# Patient Record
Sex: Female | Born: 1950 | Race: Black or African American | Hispanic: No | Marital: Single | Smoking: Current every day smoker
Health system: Southern US, Community
[De-identification: ages and names within clinical notes are randomized; demographics above are authoritative.]

## PROBLEM LIST (undated history)

## (undated) DIAGNOSIS — I050 Rheumatic mitral stenosis: Secondary | ICD-10-CM

## (undated) DIAGNOSIS — F419 Anxiety disorder, unspecified: Secondary | ICD-10-CM

## (undated) DIAGNOSIS — G4730 Sleep apnea, unspecified: Secondary | ICD-10-CM

## (undated) DIAGNOSIS — Z720 Tobacco use: Secondary | ICD-10-CM

## (undated) DIAGNOSIS — M1990 Unspecified osteoarthritis, unspecified site: Secondary | ICD-10-CM

## (undated) DIAGNOSIS — N184 Chronic kidney disease, stage 4 (severe): Secondary | ICD-10-CM

## (undated) DIAGNOSIS — E785 Hyperlipidemia, unspecified: Secondary | ICD-10-CM

## (undated) DIAGNOSIS — R55 Syncope and collapse: Secondary | ICD-10-CM

## (undated) DIAGNOSIS — M797 Fibromyalgia: Secondary | ICD-10-CM

## (undated) DIAGNOSIS — I119 Hypertensive heart disease without heart failure: Secondary | ICD-10-CM

## (undated) DIAGNOSIS — K219 Gastro-esophageal reflux disease without esophagitis: Secondary | ICD-10-CM

## (undated) DIAGNOSIS — I219 Acute myocardial infarction, unspecified: Secondary | ICD-10-CM

## (undated) DIAGNOSIS — D649 Anemia, unspecified: Secondary | ICD-10-CM

## (undated) DIAGNOSIS — E119 Type 2 diabetes mellitus without complications: Secondary | ICD-10-CM

## (undated) DIAGNOSIS — I2510 Atherosclerotic heart disease of native coronary artery without angina pectoris: Secondary | ICD-10-CM

## (undated) DIAGNOSIS — K5790 Diverticulosis of intestine, part unspecified, without perforation or abscess without bleeding: Secondary | ICD-10-CM

## (undated) DIAGNOSIS — I10 Essential (primary) hypertension: Secondary | ICD-10-CM

## (undated) DIAGNOSIS — IMO0002 Reserved for concepts with insufficient information to code with codable children: Secondary | ICD-10-CM

## (undated) DIAGNOSIS — K449 Diaphragmatic hernia without obstruction or gangrene: Secondary | ICD-10-CM

## (undated) DIAGNOSIS — R0600 Dyspnea, unspecified: Secondary | ICD-10-CM

## (undated) DIAGNOSIS — I499 Cardiac arrhythmia, unspecified: Secondary | ICD-10-CM

## (undated) HISTORY — PX: NECK SURGERY: SHX720

## (undated) HISTORY — DX: Diaphragmatic hernia without obstruction or gangrene: K44.9

## (undated) HISTORY — DX: Gastro-esophageal reflux disease without esophagitis: K21.9

## (undated) HISTORY — DX: Type 2 diabetes mellitus without complications: E11.9

## (undated) HISTORY — DX: Rheumatic mitral stenosis: I05.0

## (undated) HISTORY — DX: Tobacco use: Z72.0

## (undated) HISTORY — DX: Essential (primary) hypertension: I10

## (undated) HISTORY — DX: Unspecified osteoarthritis, unspecified site: M19.90

## (undated) HISTORY — DX: Reserved for concepts with insufficient information to code with codable children: IMO0002

## (undated) HISTORY — PX: CORONARY ANGIOPLASTY: SHX604

## (undated) HISTORY — DX: Hyperlipidemia, unspecified: E78.5

## (undated) HISTORY — DX: Atherosclerotic heart disease of native coronary artery without angina pectoris: I25.10

## (undated) HISTORY — DX: Hypertensive heart disease without heart failure: I11.9

## (undated) HISTORY — DX: Syncope and collapse: R55

## (undated) HISTORY — PX: TUBAL LIGATION: SHX77

## (undated) HISTORY — DX: Chronic kidney disease, stage 4 (severe): N18.4

---

## 2003-11-29 HISTORY — PX: BREAST CYST ASPIRATION: SHX578

## 2005-02-16 ENCOUNTER — Ambulatory Visit: Payer: Self-pay

## 2005-06-28 HISTORY — PX: CARDIAC CATHETERIZATION: SHX172

## 2005-07-28 ENCOUNTER — Ambulatory Visit: Payer: Self-pay | Admitting: Internal Medicine

## 2006-06-02 ENCOUNTER — Emergency Department: Payer: Self-pay | Admitting: Emergency Medicine

## 2006-06-13 ENCOUNTER — Ambulatory Visit: Payer: Self-pay

## 2006-08-02 ENCOUNTER — Ambulatory Visit: Payer: Self-pay | Admitting: Gastroenterology

## 2006-11-30 DIAGNOSIS — K922 Gastrointestinal hemorrhage, unspecified: Secondary | ICD-10-CM | POA: Insufficient documentation

## 2007-06-27 ENCOUNTER — Ambulatory Visit: Payer: Self-pay

## 2007-12-18 ENCOUNTER — Ambulatory Visit: Payer: Self-pay

## 2007-12-27 ENCOUNTER — Emergency Department: Payer: Self-pay | Admitting: Emergency Medicine

## 2008-07-28 ENCOUNTER — Ambulatory Visit: Payer: Self-pay | Admitting: *Deleted

## 2009-09-22 ENCOUNTER — Ambulatory Visit: Payer: Self-pay | Admitting: Family Medicine

## 2010-02-19 ENCOUNTER — Emergency Department: Payer: Self-pay | Admitting: Emergency Medicine

## 2010-09-23 ENCOUNTER — Ambulatory Visit: Payer: Self-pay | Admitting: Family Medicine

## 2011-06-03 ENCOUNTER — Ambulatory Visit: Payer: Self-pay | Admitting: Neurosurgery

## 2011-06-24 ENCOUNTER — Ambulatory Visit: Payer: Self-pay | Admitting: Internal Medicine

## 2011-09-30 ENCOUNTER — Ambulatory Visit: Payer: Self-pay | Admitting: Family Medicine

## 2011-10-11 ENCOUNTER — Ambulatory Visit: Payer: Self-pay | Admitting: Family Medicine

## 2012-01-07 ENCOUNTER — Emergency Department: Payer: Self-pay | Admitting: Emergency Medicine

## 2013-01-30 ENCOUNTER — Emergency Department: Payer: Self-pay | Admitting: Emergency Medicine

## 2013-01-30 LAB — CBC WITH DIFFERENTIAL/PLATELET
Eosinophil #: 0.1 10*3/uL (ref 0.0–0.7)
HCT: 44.7 % (ref 35.0–47.0)
Lymphocyte #: 2.3 10*3/uL (ref 1.0–3.6)
Lymphocyte %: 26.4 %
Monocyte #: 0.8 x10 3/mm (ref 0.2–0.9)
Monocyte %: 8.9 %
Neutrophil #: 5.4 10*3/uL (ref 1.4–6.5)
Neutrophil %: 62.1 %
RDW: 15.8 % — ABNORMAL HIGH (ref 11.5–14.5)
WBC: 8.7 10*3/uL (ref 3.6–11.0)

## 2013-01-30 LAB — COMPREHENSIVE METABOLIC PANEL
Albumin: 3.9 g/dL (ref 3.4–5.0)
Alkaline Phosphatase: 117 U/L (ref 50–136)
Anion Gap: 5 — ABNORMAL LOW (ref 7–16)
BUN: 31 mg/dL — ABNORMAL HIGH (ref 7–18)
Bilirubin,Total: 0.2 mg/dL (ref 0.2–1.0)
Calcium, Total: 9.6 mg/dL (ref 8.5–10.1)
Co2: 26 mmol/L (ref 21–32)
Creatinine: 1.48 mg/dL — ABNORMAL HIGH (ref 0.60–1.30)
EGFR (African American): 44 — ABNORMAL LOW
EGFR (Non-African Amer.): 38 — ABNORMAL LOW
Osmolality: 285 (ref 275–301)
Potassium: 4.5 mmol/L (ref 3.5–5.1)
SGOT(AST): 18 U/L (ref 15–37)
Sodium: 138 mmol/L (ref 136–145)
Total Protein: 8.7 g/dL — ABNORMAL HIGH (ref 6.4–8.2)

## 2013-01-30 LAB — SEDIMENTATION RATE: Erythrocyte Sed Rate: 3 mm/hr (ref 0–30)

## 2013-01-30 LAB — TROPONIN I: Troponin-I: <0.02 ng/mL

## 2013-04-17 DIAGNOSIS — E559 Vitamin D deficiency, unspecified: Secondary | ICD-10-CM | POA: Insufficient documentation

## 2013-04-23 ENCOUNTER — Ambulatory Visit: Payer: Self-pay

## 2013-04-29 ENCOUNTER — Ambulatory Visit: Payer: Self-pay

## 2013-07-22 ENCOUNTER — Inpatient Hospital Stay: Payer: Self-pay | Admitting: Internal Medicine

## 2013-07-22 LAB — BASIC METABOLIC PANEL
Anion Gap: 8 (ref 7–16)
Anion Gap: 3 — ABNORMAL LOW (ref 7–16)
BUN: 55 mg/dL — ABNORMAL HIGH (ref 7–18)
BUN: 50 mg/dL — ABNORMAL HIGH (ref 7–18)
Calcium, Total: 9.2 mg/dL (ref 8.5–10.1)
Calcium, Total: 9 mg/dL (ref 8.5–10.1)
Chloride: 109 mmol/L — ABNORMAL HIGH (ref 98–107)
Chloride: 112 mmol/L — ABNORMAL HIGH (ref 98–107)
Creatinine: 3.63 mg/dL — ABNORMAL HIGH (ref 0.60–1.30)
Creatinine: 3.23 mg/dL — ABNORMAL HIGH (ref 0.60–1.30)
EGFR (African American): 17 — ABNORMAL LOW
EGFR (Non-African Amer.): 13 — ABNORMAL LOW
EGFR (Non-African Amer.): 15 — ABNORMAL LOW
Osmolality: 298 (ref 275–301)
Sodium: 137 mmol/L (ref 136–145)

## 2013-07-22 LAB — CBC WITH DIFFERENTIAL/PLATELET
Basophil %: 1.5 %
Eosinophil %: 2.6 %
HCT: 36.4 % (ref 35.0–47.0)
HGB: 12.1 g/dL (ref 12.0–16.0)
Lymphocyte #: 2 10*3/uL (ref 1.0–3.6)
Lymphocyte %: 35.1 %
MCH: 28.7 pg (ref 26.0–34.0)
MCHC: 33.4 g/dL (ref 32.0–36.0)
MCV: 86 fL (ref 80–100)
Monocyte %: 12.6 %
Neutrophil #: 2.7 10*3/uL (ref 1.4–6.5)
Neutrophil %: 48.2 %
RBC: 4.23 10*6/uL (ref 3.80–5.20)
RDW: 15.2 % — ABNORMAL HIGH (ref 11.5–14.5)
WBC: 5.6 10*3/uL (ref 3.6–11.0)

## 2013-07-22 LAB — HEMOGLOBIN A1C: Hemoglobin A1C: 9.1 % — ABNORMAL HIGH (ref 4.2–6.3)

## 2013-07-23 LAB — MAGNESIUM: Magnesium: 1.5 mg/dL — ABNORMAL LOW

## 2013-07-23 LAB — BASIC METABOLIC PANEL
BUN: 46 mg/dL — ABNORMAL HIGH (ref 7–18)
Calcium, Total: 8.9 mg/dL (ref 8.5–10.1)
Creatinine: 2.65 mg/dL — ABNORMAL HIGH (ref 0.60–1.30)
Glucose: 99 mg/dL (ref 65–99)
Osmolality: 289 (ref 275–301)

## 2013-07-23 LAB — CBC WITH DIFFERENTIAL/PLATELET
Basophil #: 0.1 10*3/uL (ref 0.0–0.1)
Basophil %: 1.1 %
Eosinophil #: 0.1 10*3/uL (ref 0.0–0.7)
Eosinophil %: 2.8 %
HCT: 35.6 % (ref 35.0–47.0)
HGB: 11.9 g/dL — ABNORMAL LOW (ref 12.0–16.0)
Lymphocyte %: 53.1 %
MCH: 28.4 pg (ref 26.0–34.0)
MCV: 85 fL (ref 80–100)
Monocyte #: 0.5 x10 3/mm (ref 0.2–0.9)
Monocyte %: 9.7 %
Platelet: 325 10*3/uL (ref 150–440)
RBC: 4.2 10*6/uL (ref 3.80–5.20)
WBC: 5.3 10*3/uL (ref 3.6–11.0)

## 2013-07-23 LAB — LIPID PANEL
Cholesterol: 291 mg/dL — ABNORMAL HIGH (ref 0–200)
HDL Cholesterol: 37 mg/dL — ABNORMAL LOW (ref 40–60)

## 2013-07-24 LAB — BASIC METABOLIC PANEL
Anion Gap: 7 (ref 7–16)
Calcium, Total: 8.8 mg/dL (ref 8.5–10.1)
Co2: 28 mmol/L (ref 21–32)
Creatinine: 2.1 mg/dL — ABNORMAL HIGH (ref 0.60–1.30)
Glucose: 93 mg/dL (ref 65–99)
Potassium: 4 mmol/L (ref 3.5–5.1)
Sodium: 139 mmol/L (ref 136–145)

## 2013-08-05 ENCOUNTER — Ambulatory Visit: Payer: Self-pay

## 2013-08-31 ENCOUNTER — Emergency Department: Payer: Self-pay | Admitting: Emergency Medicine

## 2014-04-25 ENCOUNTER — Inpatient Hospital Stay: Payer: Self-pay | Admitting: Internal Medicine

## 2014-04-25 DIAGNOSIS — N189 Chronic kidney disease, unspecified: Secondary | ICD-10-CM

## 2014-04-25 DIAGNOSIS — I214 Non-ST elevation (NSTEMI) myocardial infarction: Secondary | ICD-10-CM

## 2014-04-25 DIAGNOSIS — I10 Essential (primary) hypertension: Secondary | ICD-10-CM

## 2014-04-25 LAB — CBC WITH DIFFERENTIAL/PLATELET
BASOS ABS: 0.1 10*3/uL (ref 0.0–0.1)
Basophil %: 1.3 %
Eosinophil #: 0.1 10*3/uL (ref 0.0–0.7)
Eosinophil %: 1.8 %
HCT: 35.8 % (ref 35.0–47.0)
HGB: 11.8 g/dL — ABNORMAL LOW (ref 12.0–16.0)
Lymphocyte #: 2.4 10*3/uL (ref 1.0–3.6)
Lymphocyte %: 38.7 %
MCH: 28.3 pg (ref 26.0–34.0)
MCHC: 32.8 g/dL (ref 32.0–36.0)
MCV: 86 fL (ref 80–100)
MONOS PCT: 8.3 %
Monocyte #: 0.5 x10 3/mm (ref 0.2–0.9)
NEUTROS ABS: 3.1 10*3/uL (ref 1.4–6.5)
NEUTROS PCT: 49.9 %
PLATELETS: 329 10*3/uL (ref 150–440)
RBC: 4.16 10*6/uL (ref 3.80–5.20)
RDW: 15 % — ABNORMAL HIGH (ref 11.5–14.5)
WBC: 6.1 10*3/uL (ref 3.6–11.0)

## 2014-04-25 LAB — COMPREHENSIVE METABOLIC PANEL
ALK PHOS: 108 U/L
ANION GAP: 10 (ref 7–16)
AST: 19 U/L (ref 15–37)
Albumin: 3.6 g/dL (ref 3.4–5.0)
BUN: 37 mg/dL — ABNORMAL HIGH (ref 7–18)
Bilirubin,Total: 0.2 mg/dL (ref 0.2–1.0)
Calcium, Total: 9.3 mg/dL (ref 8.5–10.1)
Chloride: 106 mmol/L (ref 98–107)
Co2: 18 mmol/L — ABNORMAL LOW (ref 21–32)
Creatinine: 1.89 mg/dL — ABNORMAL HIGH (ref 0.60–1.30)
EGFR (African American): 32 — ABNORMAL LOW
GFR CALC NON AF AMER: 28 — AB
Glucose: 268 mg/dL — ABNORMAL HIGH (ref 65–99)
OSMOLALITY: 286 (ref 275–301)
Potassium: 5.1 mmol/L (ref 3.5–5.1)
SGPT (ALT): 27 U/L (ref 12–78)
Sodium: 134 mmol/L — ABNORMAL LOW (ref 136–145)
TOTAL PROTEIN: 7.5 g/dL (ref 6.4–8.2)

## 2014-04-25 LAB — CK-MB: CK-MB: 2.6 ng/mL (ref 0.5–3.6)

## 2014-04-25 LAB — URINALYSIS, COMPLETE
Bacteria: NONE SEEN
Bilirubin,UR: NEGATIVE
Blood: NEGATIVE
Glucose,UR: 50 mg/dL (ref 0–75)
Ketone: NEGATIVE
Nitrite: NEGATIVE
PH: 5 (ref 4.5–8.0)
RBC,UR: NONE SEEN /HPF (ref 0–5)
Specific Gravity: 1.014 (ref 1.003–1.030)
Squamous Epithelial: 2

## 2014-04-25 LAB — PROTIME-INR
INR: 1
Prothrombin Time: 13 secs (ref 11.5–14.7)

## 2014-04-25 LAB — APTT
Activated PTT: 34.4 secs (ref 23.6–35.9)
Activated PTT: 53.7 secs — ABNORMAL HIGH (ref 23.6–35.9)

## 2014-04-25 LAB — TROPONIN I
TROPONIN-I: 0.3 ng/mL — AB
Troponin-I: 0.37 ng/mL — ABNORMAL HIGH
Troponin-I: 0.3 ng/mL — ABNORMAL HIGH

## 2014-04-25 LAB — CK TOTAL AND CKMB (NOT AT ARMC)
CK, TOTAL: 169 U/L
CK, Total: 162 U/L
CK-MB: 2.3 ng/mL (ref 0.5–3.6)
CK-MB: 2 ng/mL (ref 0.5–3.6)

## 2014-04-25 LAB — LIPASE, BLOOD: LIPASE: 740 U/L — AB (ref 73–393)

## 2014-04-26 DIAGNOSIS — I059 Rheumatic mitral valve disease, unspecified: Secondary | ICD-10-CM

## 2014-04-26 LAB — CBC WITH DIFFERENTIAL/PLATELET
BASOS ABS: 0 10*3/uL (ref 0.0–0.1)
Basophil %: 0.5 %
Eosinophil #: 0 10*3/uL (ref 0.0–0.7)
Eosinophil %: 0.1 %
HCT: 35.9 % (ref 35.0–47.0)
HGB: 11.5 g/dL — AB (ref 12.0–16.0)
Lymphocyte #: 1.3 10*3/uL (ref 1.0–3.6)
Lymphocyte %: 18.2 %
MCH: 27.7 pg (ref 26.0–34.0)
MCHC: 32.1 g/dL (ref 32.0–36.0)
MCV: 86 fL (ref 80–100)
MONO ABS: 0.2 x10 3/mm (ref 0.2–0.9)
Monocyte %: 2.3 %
NEUTROS ABS: 5.6 10*3/uL (ref 1.4–6.5)
NEUTROS PCT: 78.9 %
PLATELETS: 325 10*3/uL (ref 150–440)
RBC: 4.16 10*6/uL (ref 3.80–5.20)
RDW: 15.7 % — ABNORMAL HIGH (ref 11.5–14.5)
WBC: 7 10*3/uL (ref 3.6–11.0)

## 2014-04-26 LAB — BASIC METABOLIC PANEL
ANION GAP: 6 — AB (ref 7–16)
BUN: 40 mg/dL — ABNORMAL HIGH (ref 7–18)
CHLORIDE: 108 mmol/L — AB (ref 98–107)
CREATININE: 1.97 mg/dL — AB (ref 0.60–1.30)
Calcium, Total: 9.1 mg/dL (ref 8.5–10.1)
Co2: 19 mmol/L — ABNORMAL LOW (ref 21–32)
GFR CALC AF AMER: 31 — AB
GFR CALC NON AF AMER: 27 — AB
GLUCOSE: 290 mg/dL — AB (ref 65–99)
Osmolality: 287 (ref 275–301)
POTASSIUM: 5.9 mmol/L — AB (ref 3.5–5.1)
Sodium: 133 mmol/L — ABNORMAL LOW (ref 136–145)

## 2014-04-26 LAB — POTASSIUM: Potassium: 4.6 mmol/L (ref 3.5–5.1)

## 2014-04-26 LAB — APTT: ACTIVATED PTT: 81.9 s — AB (ref 23.6–35.9)

## 2014-04-27 LAB — BASIC METABOLIC PANEL
Anion Gap: 5 — ABNORMAL LOW (ref 7–16)
BUN: 32 mg/dL — ABNORMAL HIGH (ref 7–18)
Calcium, Total: 8.7 mg/dL (ref 8.5–10.1)
Chloride: 111 mmol/L — ABNORMAL HIGH (ref 98–107)
Co2: 22 mmol/L (ref 21–32)
Creatinine: 1.59 mg/dL — ABNORMAL HIGH (ref 0.60–1.30)
EGFR (African American): 40 — ABNORMAL LOW
EGFR (Non-African Amer.): 34 — ABNORMAL LOW
Glucose: 142 mg/dL — ABNORMAL HIGH (ref 65–99)
OSMOLALITY: 285 (ref 275–301)
Potassium: 4.2 mmol/L (ref 3.5–5.1)
Sodium: 138 mmol/L (ref 136–145)

## 2014-04-27 LAB — APTT
Activated PTT: 44.2 secs — ABNORMAL HIGH (ref 23.6–35.9)
Activated PTT: 42.2 secs — ABNORMAL HIGH (ref 23.6–35.9)
Activated PTT: 48.1 secs — ABNORMAL HIGH (ref 23.6–35.9)

## 2014-04-28 ENCOUNTER — Encounter: Payer: Self-pay | Admitting: Cardiovascular Disease

## 2014-04-28 DIAGNOSIS — E785 Hyperlipidemia, unspecified: Secondary | ICD-10-CM

## 2014-04-28 DIAGNOSIS — I2510 Atherosclerotic heart disease of native coronary artery without angina pectoris: Secondary | ICD-10-CM

## 2014-04-28 HISTORY — PX: CARDIAC CATHETERIZATION: SHX172

## 2014-04-28 LAB — APTT: Activated PTT: 77.4 secs — ABNORMAL HIGH (ref 23.6–35.9)

## 2014-04-28 LAB — HEMOGLOBIN: HGB: 11.6 g/dL — ABNORMAL LOW (ref 12.0–16.0)

## 2014-04-28 LAB — PLATELET COUNT: Platelet: 335 10*3/uL (ref 150–440)

## 2014-04-29 ENCOUNTER — Encounter: Payer: Self-pay | Admitting: *Deleted

## 2014-04-29 ENCOUNTER — Telehealth: Payer: Self-pay

## 2014-04-29 DIAGNOSIS — I214 Non-ST elevation (NSTEMI) myocardial infarction: Secondary | ICD-10-CM

## 2014-04-29 DIAGNOSIS — I059 Rheumatic mitral valve disease, unspecified: Secondary | ICD-10-CM

## 2014-04-29 LAB — BASIC METABOLIC PANEL
ANION GAP: 8 (ref 7–16)
BUN: 27 mg/dL — AB (ref 7–18)
CHLORIDE: 109 mmol/L — AB (ref 98–107)
CO2: 21 mmol/L (ref 21–32)
CREATININE: 1.54 mg/dL — AB (ref 0.60–1.30)
Calcium, Total: 8.8 mg/dL (ref 8.5–10.1)
EGFR (African American): 41 — ABNORMAL LOW
EGFR (Non-African Amer.): 36 — ABNORMAL LOW
Glucose: 187 mg/dL — ABNORMAL HIGH (ref 65–99)
OSMOLALITY: 286 (ref 275–301)
POTASSIUM: 4.8 mmol/L (ref 3.5–5.1)
Sodium: 138 mmol/L (ref 136–145)

## 2014-04-29 LAB — CBC WITH DIFFERENTIAL/PLATELET
BASOS PCT: 0.6 %
Basophil #: 0.1 10*3/uL (ref 0.0–0.1)
EOS PCT: 2.3 %
Eosinophil #: 0.2 10*3/uL (ref 0.0–0.7)
HCT: 32.6 % — ABNORMAL LOW (ref 35.0–47.0)
HGB: 10.4 g/dL — ABNORMAL LOW (ref 12.0–16.0)
Lymphocyte #: 3.2 10*3/uL (ref 1.0–3.6)
Lymphocyte %: 39.8 %
MCH: 27.7 pg (ref 26.0–34.0)
MCHC: 31.8 g/dL — AB (ref 32.0–36.0)
MCV: 87 fL (ref 80–100)
Monocyte #: 0.7 x10 3/mm (ref 0.2–0.9)
Monocyte %: 9 %
NEUTROS ABS: 3.9 10*3/uL (ref 1.4–6.5)
NEUTROS PCT: 48.3 %
PLATELETS: 312 10*3/uL (ref 150–440)
RBC: 3.74 10*6/uL — ABNORMAL LOW (ref 3.80–5.20)
RDW: 15.9 % — AB (ref 11.5–14.5)
WBC: 8.1 10*3/uL (ref 3.6–11.0)

## 2014-04-29 LAB — APTT: Activated PTT: 30.2 secs (ref 23.6–35.9)

## 2014-04-29 NOTE — Telephone Encounter (Signed)
Attempted to contact pt regarding discharge from [facility] on 04/29/14. Left message w/ pt's room mate, Mrs. Satterfield, to have pt call us back.  She states that pt is currently asleep and she will give her the message when she wakes up.

## 2014-04-30 NOTE — Telephone Encounter (Signed)
Patient contacted regarding discharge from [facility] on 04/29/14.  Patient understands to follow up with Ignacia Bayley, NP on 05/06/14 at 1:45 at [facility]. Patient understands discharge instructions? yes Patient understands medications and regiment? yes Patient understands to bring all medications to this visit? yes  Pt states that she was told not to stop her plavix and that it would only cost $5, but when she went to pick it up, it cost her $72, so she only got 1 pill. She would like to know if there is a cheaper alternative that could be called in for her.  She is currently in [place] recuperating at her sister's home and will not be back in town until Monday, so she cannot come in for a sooner appt.

## 2014-05-05 ENCOUNTER — Encounter: Payer: Self-pay | Admitting: *Deleted

## 2014-05-06 ENCOUNTER — Other Ambulatory Visit: Payer: Self-pay

## 2014-05-06 ENCOUNTER — Ambulatory Visit (INDEPENDENT_AMBULATORY_CARE_PROVIDER_SITE_OTHER): Payer: Self-pay | Admitting: Nurse Practitioner

## 2014-05-06 ENCOUNTER — Encounter: Payer: Self-pay | Admitting: Nurse Practitioner

## 2014-05-06 VITALS — BP 120/62 | HR 76 | Ht 65.0 in | Wt 149.8 lb

## 2014-05-06 DIAGNOSIS — IMO0002 Reserved for concepts with insufficient information to code with codable children: Secondary | ICD-10-CM | POA: Insufficient documentation

## 2014-05-06 DIAGNOSIS — I10 Essential (primary) hypertension: Secondary | ICD-10-CM

## 2014-05-06 DIAGNOSIS — E785 Hyperlipidemia, unspecified: Secondary | ICD-10-CM

## 2014-05-06 DIAGNOSIS — I214 Non-ST elevation (NSTEMI) myocardial infarction: Secondary | ICD-10-CM

## 2014-05-06 DIAGNOSIS — K219 Gastro-esophageal reflux disease without esophagitis: Secondary | ICD-10-CM | POA: Insufficient documentation

## 2014-05-06 DIAGNOSIS — I2510 Atherosclerotic heart disease of native coronary artery without angina pectoris: Secondary | ICD-10-CM

## 2014-05-06 DIAGNOSIS — E119 Type 2 diabetes mellitus without complications: Secondary | ICD-10-CM | POA: Insufficient documentation

## 2014-05-06 DIAGNOSIS — N184 Chronic kidney disease, stage 4 (severe): Secondary | ICD-10-CM | POA: Insufficient documentation

## 2014-05-06 DIAGNOSIS — R079 Chest pain, unspecified: Secondary | ICD-10-CM

## 2014-05-06 MED ORDER — NITROGLYCERIN 0.4 MG SL SUBL: 0.4000 mg | SUBLINGUAL_TABLET | SUBLINGUAL | Status: DC | PRN

## 2014-05-06 NOTE — Telephone Encounter (Signed)
Refill sent for NTG

## 2014-05-06 NOTE — Progress Notes (Signed)
Patient Name: Caitlin Bass Date of Encounter: 05/06/2014  Primary Care Provider:  Placido Sou, MD Primary Cardiologist:  Jerilynn Mages. Fletcher Anon, MD   Patient Profile  63 y/o female s/p recent NSTEMI, who presents for clinic f/u.  Problem List   Past Medical History  Diagnosis Date  . Coronary artery disease     a. 03/2014 NSTEMI/Cath: LM nl, LAD 66m, 60d, D1 60, D2 nl, d3 40, LCX 100 w/ L->L collats, RCA non dom, min irregs, EF 55%.  . Diabetes mellitus without complication   . Hypertension   . CKD (chronic kidney disease), stage IV   . GERD (gastroesophageal reflux disease)   . DDD (degenerative disc disease)     a. s/p upper back surgery spring 2015.  Marland Kitchen Arthritis   . Hiatal hernia   . Hyperlipidemia   . Syncope and collapse     a. Early 03/2014->did not seek medical attn.   Past Surgical History  Procedure Laterality Date  . Neck surgery      Degenerative Disk Disease and removal of a spinal cyst  . Cardiac catheterization  06/2005    [facility]  . Cardiac catheterization  04/2014    [facility]  . Tubal ligation      Allergies  Allergies  Allergen Reactions  . Aspirin   . Hydrochlorothiazide     HPI  63 y/o female with the above problem list.  She presented to [facility] with bilat arm and right chest pain in late May and r/i for NSTEMI.  Echo showed nl LV fxn.  She underwent cath revealing an occluded LCX with L-L collats and otw nonobstructive CAD.  She was medically managed and placed on asa, plavix, bb, acei, long-acting nitrate, and statin therapy.  Since d/c, she has done well w/o recurrent Ss.  She has quit smoking.  She was not able to afford plavix and as a result, has not been taking it.  She also did not get sl ntg filled 2/2 cost.  She denies chest pain, palpitations, dyspnea, pnd, orthopnea, n, v, dizziness, syncope, edema, weight gain, or early satiety.   Home Medications  Prior to Admission medications   Medication Sig Start Date End Date Taking? Authorizing Provider    aspirin 81 MG tablet Take 81 mg by mouth daily.   Yes Historical Provider, MD  atorvastatin (LIPITOR) 40 MG tablet Take 40 mg by mouth daily.   Yes Historical Provider, MD  carvedilol (COREG) 12.5 MG tablet Take 12.5 mg by mouth 2 (two) times daily with a meal.   Yes Historical Provider, MD  glipiZIDE (GLUCOTROL) 5 MG tablet Take 5 mg by mouth daily before breakfast.   Yes Historical Provider, MD  insulin glargine (LANTUS) 100 UNIT/ML injection Inject 64 Units into the skin at bedtime.   Yes Historical Provider, MD  Insulin Lispro, Human, (HUMALOG KWIKPEN [place]) Inject 5 Units into the skin 2 (two) times daily.   Yes Historical Provider, MD  isosorbide mononitrate (IMDUR) 30 MG 24 hr tablet Take 30 mg by mouth daily.   Yes Historical Provider, MD  lisinopril (PRINIVIL,ZESTRIL) 40 MG tablet Take 40 mg by mouth daily.   Yes Historical Provider, MD  magnesium oxide (MAG-OX) 400 MG tablet Take 400 mg by mouth 2 (two) times daily.   Yes Historical Provider, MD  ranitidine (ZANTAC) 150 MG tablet Take 150 mg by mouth 2 (two) times daily.   Yes Historical Provider, MD  nitroGLYCERIN (NITROSTAT) 0.4 MG SL tablet Place 1 tablet (0.4 mg total)  under the tongue every 5 (five) minutes as needed for chest pain. 05/06/14   Rogelia Mire, NP    Review of Systems  She denies chest pain, palpitations, dyspnea, pnd, orthopnea, n, v, dizziness, syncope, edema, weight gain, or early satiety.  All other systems reviewed and are otherwise negative except as noted above.  Physical Exam  Blood pressure 120/62, pulse 76, height 5\' 5"  (1.651 m), weight 149 lb 12 oz (67.926 kg).  General: Pleasant, NAD Psych: Normal affect. Neuro: Alert and oriented X 3. Moves all extremities spontaneously. HEENT: Normal  Neck: Supple without bruits or JVD. Lungs:  Resp regular and unlabored, CTA. Heart: RRR no s3, s4, 2/6 SEM bilat USB. Abdomen: Soft, non-tender, non-distended, BS + x 4.  Extremities: No clubbing, cyanosis or  edema. DP/PT/Radials 2+ and equal bilaterally.  R wrist cath site w/o bleeding/bruit/hematoma.  Accessory Clinical Findings  ECG - RSR 76, inflat st/t nonspecific changes.  Assessment & Plan  1.  NSTEMI, subsequent episode of care/CAD:  S/p cath revealing occluded LCX with L-L collaterals.  Medical therapy initiated.  She could not afford plavix and thus remains on asa, statin, bb, acei, nitrate.  We discussed substituting brilinta for plavix in the setting of recent ACS w/o stenting, as we have samples.  After we discussed possible side effects of dyspnea, she was not interested.  She is considering cardiac rehab but currently has no insurance. She is in the process of seeking disability r/t back pain and arm numbness from DDD.  2.  HTN:  Stable.  3.  HL:  On statin.  4.  DM:  On insulin per IM.  5.  Tob Abuse:  Quit smoking.  Continued cessation advised.  6.  Dispo:  F/u Dr. Fletcher Anon in 2-3 mos.   Rogelia Mire, NP 05/06/2014, 4:28 PM

## 2014-05-06 NOTE — Patient Instructions (Signed)
Continue with the current medications.  Follow up with Dr. Fletcher Anon in 2 months.

## 2014-05-09 ENCOUNTER — Inpatient Hospital Stay: Payer: Self-pay | Admitting: Internal Medicine

## 2014-05-09 LAB — CK TOTAL AND CKMB (NOT AT ARMC)
CK, TOTAL: 136 U/L
CK-MB: 1.5 ng/mL (ref 0.5–3.6)

## 2014-05-09 LAB — BASIC METABOLIC PANEL
ANION GAP: 7 (ref 7–16)
BUN: 31 mg/dL — ABNORMAL HIGH (ref 7–18)
CALCIUM: 9 mg/dL (ref 8.5–10.1)
CO2: 23 mmol/L (ref 21–32)
CREATININE: 1.97 mg/dL — AB (ref 0.60–1.30)
Chloride: 109 mmol/L — ABNORMAL HIGH (ref 98–107)
GFR CALC AF AMER: 31 — AB
GFR CALC NON AF AMER: 27 — AB
GLUCOSE: 200 mg/dL — AB (ref 65–99)
Osmolality: 290 (ref 275–301)
Potassium: 4.8 mmol/L (ref 3.5–5.1)
Sodium: 139 mmol/L (ref 136–145)

## 2014-05-09 LAB — URINALYSIS, COMPLETE
BLOOD: NEGATIVE
Bilirubin,UR: NEGATIVE
Glucose,UR: NEGATIVE mg/dL (ref 0–75)
Hyaline Cast: 7
Ketone: NEGATIVE
Nitrite: NEGATIVE
Ph: 6 (ref 4.5–8.0)
RBC,UR: 3 /HPF (ref 0–5)
Specific Gravity: 1.015 (ref 1.003–1.030)
WBC UR: 10 /HPF (ref 0–5)

## 2014-05-09 LAB — PROTIME-INR
INR: 0.9
PROTHROMBIN TIME: 12.5 s (ref 11.5–14.7)

## 2014-05-09 LAB — CBC WITH DIFFERENTIAL/PLATELET
Basophil #: 0.1 10*3/uL (ref 0.0–0.1)
Basophil %: 1.4 %
EOS ABS: 0.2 10*3/uL (ref 0.0–0.7)
Eosinophil %: 2.4 %
HCT: 33.7 % — ABNORMAL LOW (ref 35.0–47.0)
HGB: 10.9 g/dL — ABNORMAL LOW (ref 12.0–16.0)
LYMPHS ABS: 2.8 10*3/uL (ref 1.0–3.6)
Lymphocyte %: 40.7 %
MCH: 28.5 pg (ref 26.0–34.0)
MCHC: 32.3 g/dL (ref 32.0–36.0)
MCV: 88 fL (ref 80–100)
MONO ABS: 0.6 x10 3/mm (ref 0.2–0.9)
Monocyte %: 8.6 %
Neutrophil #: 3.2 10*3/uL (ref 1.4–6.5)
Neutrophil %: 46.9 %
PLATELETS: 356 10*3/uL (ref 150–440)
RBC: 3.82 10*6/uL (ref 3.80–5.20)
RDW: 15.9 % — AB (ref 11.5–14.5)
WBC: 6.8 10*3/uL (ref 3.6–11.0)

## 2014-05-09 LAB — COMPREHENSIVE METABOLIC PANEL
ALBUMIN: 3.7 g/dL (ref 3.4–5.0)
ALK PHOS: 100 U/L
ANION GAP: 2 — AB (ref 7–16)
AST: 32 U/L (ref 15–37)
BUN: 34 mg/dL — ABNORMAL HIGH (ref 7–18)
Bilirubin,Total: 0.3 mg/dL (ref 0.2–1.0)
CREATININE: 2.17 mg/dL — AB (ref 0.60–1.30)
Calcium, Total: 9.2 mg/dL (ref 8.5–10.1)
Chloride: 107 mmol/L (ref 98–107)
Co2: 25 mmol/L (ref 21–32)
EGFR (Non-African Amer.): 24 — ABNORMAL LOW
GFR CALC AF AMER: 27 — AB
Glucose: 186 mg/dL — ABNORMAL HIGH (ref 65–99)
OSMOLALITY: 281 (ref 275–301)
POTASSIUM: 6.3 mmol/L — AB (ref 3.5–5.1)
SGPT (ALT): 29 U/L (ref 12–78)
Sodium: 134 mmol/L — ABNORMAL LOW (ref 136–145)
Total Protein: 7.3 g/dL (ref 6.4–8.2)

## 2014-05-09 LAB — TROPONIN I: Troponin-I: <0.02 ng/mL

## 2014-05-09 LAB — APTT: Activated PTT: 25.3 secs (ref 23.6–35.9)

## 2014-05-10 LAB — CBC WITH DIFFERENTIAL/PLATELET
BASOS ABS: 0.1 10*3/uL (ref 0.0–0.1)
BASOS PCT: 0.8 %
EOS PCT: 1.7 %
Eosinophil #: 0.1 10*3/uL (ref 0.0–0.7)
HCT: 30.1 % — ABNORMAL LOW (ref 35.0–47.0)
HGB: 9.7 g/dL — ABNORMAL LOW (ref 12.0–16.0)
LYMPHS ABS: 2.6 10*3/uL (ref 1.0–3.6)
Lymphocyte %: 33 %
MCH: 28.3 pg (ref 26.0–34.0)
MCHC: 32.4 g/dL (ref 32.0–36.0)
MCV: 87 fL (ref 80–100)
Monocyte #: 0.6 x10 3/mm (ref 0.2–0.9)
Monocyte %: 8.2 %
NEUTROS ABS: 4.4 10*3/uL (ref 1.4–6.5)
Neutrophil %: 56.3 %
Platelet: 337 10*3/uL (ref 150–440)
RBC: 3.44 10*6/uL — AB (ref 3.80–5.20)
RDW: 15.7 % — ABNORMAL HIGH (ref 11.5–14.5)
WBC: 7.8 10*3/uL (ref 3.6–11.0)

## 2014-05-10 LAB — BASIC METABOLIC PANEL
ANION GAP: 2 — AB (ref 7–16)
BUN: 31 mg/dL — ABNORMAL HIGH (ref 7–18)
CREATININE: 1.92 mg/dL — AB (ref 0.60–1.30)
Calcium, Total: 8.7 mg/dL (ref 8.5–10.1)
Chloride: 111 mmol/L — ABNORMAL HIGH (ref 98–107)
Co2: 24 mmol/L (ref 21–32)
EGFR (African American): 32 — ABNORMAL LOW
EGFR (Non-African Amer.): 27 — ABNORMAL LOW
Glucose: 134 mg/dL — ABNORMAL HIGH (ref 65–99)
OSMOLALITY: 282 (ref 275–301)
Potassium: 4.5 mmol/L (ref 3.5–5.1)
SODIUM: 137 mmol/L (ref 136–145)

## 2014-05-10 LAB — TROPONIN I: Troponin-I: <0.02 ng/mL

## 2014-07-10 ENCOUNTER — Ambulatory Visit: Payer: Self-pay | Admitting: Cardiovascular Disease

## 2014-07-24 ENCOUNTER — Ambulatory Visit (INDEPENDENT_AMBULATORY_CARE_PROVIDER_SITE_OTHER): Payer: Self-pay | Admitting: Cardiovascular Disease

## 2014-07-24 ENCOUNTER — Encounter: Payer: Self-pay | Admitting: Cardiovascular Disease

## 2014-07-24 VITALS — BP 132/82 | HR 74 | Ht 65.0 in | Wt 152.8 lb

## 2014-07-24 DIAGNOSIS — E785 Hyperlipidemia, unspecified: Secondary | ICD-10-CM

## 2014-07-24 DIAGNOSIS — R5383 Other fatigue: Secondary | ICD-10-CM

## 2014-07-24 DIAGNOSIS — R5381 Other malaise: Secondary | ICD-10-CM

## 2014-07-24 DIAGNOSIS — I158 Other secondary hypertension: Secondary | ICD-10-CM

## 2014-07-24 DIAGNOSIS — I2510 Atherosclerotic heart disease of native coronary artery without angina pectoris: Secondary | ICD-10-CM

## 2014-07-24 DIAGNOSIS — I209 Angina pectoris, unspecified: Secondary | ICD-10-CM

## 2014-07-24 DIAGNOSIS — I25119 Atherosclerotic heart disease of native coronary artery with unspecified angina pectoris: Secondary | ICD-10-CM

## 2014-07-24 NOTE — Assessment & Plan Note (Signed)
Blood pressure is reasonably controlled on current medications. 

## 2014-07-24 NOTE — Assessment & Plan Note (Signed)
Continue treatment with atorvastatin with a target LDL of less than 70. 

## 2014-07-24 NOTE — Assessment & Plan Note (Signed)
She is doing reasonably well with no symptoms suggestive of recurrent angina. Continue medical therapy.

## 2014-07-24 NOTE — Progress Notes (Signed)
Primary care physician: Dr. Placido Sou at [facility]  HPI  63 y/o female who is here today for a followup visit regarding coronary artery disease.  She presented to [facility] with bilat arm and right chest pain in late May and r/i for NSTEMI. Echo showed nl LV fxn. She underwent cath revealing an occluded LCX with L-L collats and otw nonobstructive CAD. She was medically managed and placed on asa, plavix, bb, acei, long-acting nitrate, and statin therapy.  She has quit smoking. She was not able to afford plavix . She has been doing well overall with no reported chest pain, shortness of breath or palpitations.  Allergies  Allergen Reactions  . Aspirin   . Hydrochlorothiazide      Current Outpatient Prescriptions on File Prior to Visit  Medication Sig Dispense Refill  . aspirin 81 MG tablet Take 81 mg by mouth daily.      Marland Kitchen atorvastatin (LIPITOR) 40 MG tablet Take 40 mg by mouth daily.      . carvedilol (COREG) 12.5 MG tablet Take 12.5 mg by mouth 2 (two) times daily with a meal.      . glipiZIDE (GLUCOTROL) 5 MG tablet Take 5 mg by mouth daily before breakfast.      . insulin glargine (LANTUS) 100 UNIT/ML injection Inject 64 Units into the skin at bedtime.      . Insulin Lispro, Human, (HUMALOG KWIKPEN [place]) Inject 5 Units into the skin 2 (two) times daily.      . isosorbide mononitrate (IMDUR) 30 MG 24 hr tablet Take 30 mg by mouth daily.      Marland Kitchen lisinopril (PRINIVIL,ZESTRIL) 40 MG tablet Take 40 mg by mouth daily.      . magnesium oxide (MAG-OX) 400 MG tablet Take 400 mg by mouth 2 (two) times daily.      . nitroGLYCERIN (NITROSTAT) 0.4 MG SL tablet Place 1 tablet (0.4 mg total) under the tongue every 5 (five) minutes as needed for chest pain.  25 tablet  1  . ranitidine (ZANTAC) 150 MG tablet Take 150 mg by mouth 2 (two) times daily.       No current facility-administered medications on file prior to visit.     Past Medical History  Diagnosis Date  . Coronary artery disease    a. 03/2014 NSTEMI/Cath: LM nl, LAD 46m, 60d, D1 60, D2 nl, d3 40, LCX 100 w/ L->L collats, RCA non dom, min irregs, EF 55%.  . Diabetes mellitus without complication   . Hypertension   . CKD (chronic kidney disease), stage IV   . GERD (gastroesophageal reflux disease)   . DDD (degenerative disc disease)     a. s/p upper back surgery spring 2015.  Marland Kitchen Arthritis   . Hiatal hernia   . Hyperlipidemia   . Syncope and collapse     a. Early 03/2014->did not seek medical attn.     Past Surgical History  Procedure Laterality Date  . Neck surgery      Degenerative Disk Disease and removal of a spinal cyst  . Cardiac catheterization  06/2005    [facility]  . Cardiac catheterization  04/2014    [facility]  . Tubal ligation       Family History  Problem Relation Age of Onset  . Heart disease Mother   . Heart attack Mother   . Hypertension Mother   . Hyperlipidemia Mother      History   Social History  . Marital Status: Widowed  Spouse Name: N/A    Number of Children: N/A  . Years of Education: N/A   Occupational History  . Not on file.   Social History Main Topics  . Smoking status: Former Smoker -- 0.50 packs/day for 40 years    Types: Cigarettes  . Smokeless tobacco: Not on file  . Alcohol Use: No  . Drug Use: No  . Sexual Activity: Not on file   Other Topics Concern  . Not on file   Social History Narrative  . No narrative on file      PHYSICAL EXAM   BP 132/82  Pulse 74  Ht 5\' 5"  (1.651 m)  Wt 152 lb 12 oz (69.287 kg)  BMI 25.42 kg/m2 Constitutional: She is oriented to person, place, and time. She appears well-developed and well-nourished. No distress.  HENT: No nasal discharge.  Head: Normocephalic and atraumatic.  Eyes: Pupils are equal and round. No discharge.  Neck: Normal range of motion. Neck supple. No JVD present. No thyromegaly present.  Cardiovascular: Normal rate, regular rhythm, normal heart sounds. Exam reveals no gallop and no friction rub. No murmur  heard.  Pulmonary/Chest: Effort normal and breath sounds normal. No stridor. No respiratory distress. She has no wheezes. She has no rales. She exhibits no tenderness.  Abdominal: Soft. Bowel sounds are normal. She exhibits no distension. There is no tenderness. There is no rebound and no guarding.  Musculoskeletal: Normal range of motion. She exhibits no edema and no tenderness.  Neurological: She is alert and oriented to person, place, and time. Coordination normal.  Skin: Skin is warm and dry. No rash noted. She is not diaphoretic. No erythema. No pallor.  Psychiatric: She has a normal mood and affect. Her behavior is normal. Judgment and thought content normal.     [id number]  Rhythm  -Left axis -anterior fascicular block.   ABNORMAL    ASSESSMENT AND PLAN

## 2014-07-24 NOTE — Patient Instructions (Signed)
Continue same medications.   Your physician wants you to follow-up in: 6 months.  You will receive a reminder letter in the mail two months in advance. If you don't receive a letter, please call our office to schedule the follow-up appointment.  

## 2014-07-30 ENCOUNTER — Ambulatory Visit: Payer: Self-pay

## 2014-09-30 ENCOUNTER — Telehealth: Payer: Self-pay | Admitting: *Deleted

## 2014-09-30 NOTE — Telephone Encounter (Signed)
Request from [employer] , sent to [employer] on11/4/15.

## 2014-10-21 ENCOUNTER — Ambulatory Visit: Payer: Self-pay

## 2014-10-21 LAB — COMPREHENSIVE METABOLIC PANEL
ALK PHOS: 94 U/L
ANION GAP: 7 (ref 7–16)
Albumin: 3.6 g/dL (ref 3.4–5.0)
BUN: 21 mg/dL — AB (ref 7–18)
Bilirubin,Total: 0.2 mg/dL (ref 0.2–1.0)
CALCIUM: 9.1 mg/dL (ref 8.5–10.1)
CHLORIDE: 108 mmol/L — AB (ref 98–107)
CO2: 24 mmol/L (ref 21–32)
Creatinine: 1.45 mg/dL — ABNORMAL HIGH (ref 0.60–1.30)
EGFR (African American): 47 — ABNORMAL LOW
EGFR (Non-African Amer.): 39 — ABNORMAL LOW
GLUCOSE: 103 mg/dL — AB (ref 65–99)
Osmolality: 281 (ref 275–301)
Potassium: 4.4 mmol/L (ref 3.5–5.1)
SGOT(AST): 15 U/L (ref 15–37)
SGPT (ALT): 27 U/L
Sodium: 139 mmol/L (ref 136–145)
TOTAL PROTEIN: 7.5 g/dL (ref 6.4–8.2)

## 2015-03-20 NOTE — Consult Note (Signed)
PATIENT NAME:  Caitlin Bass, Caitlin Bass MR#:  [id number] DATE OF BIRTH:  1951-03-29  DATE OF CONSULTATION:  01/30/2013  REFERRING PHYSICIAN:   CONSULTING PHYSICIAN:  Vipul S. Manuella Ghazi, MD  PRIMARY CARE PHYSICIAN:  [name].  REQUESTING PHYSICIAN:  Dr. Conni Slipper.   CHIEF COMPLAINT:  Neck pain.   HISTORY OF PRESENT ILLNESS:  The patient is a 64 year old female with a known history of hypertension, diabetes, coronary artery disease status post stent, was seen in consultation for severe neck pain.  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  She did not have any fall or injury.  She started having continuous throbbing pain from the same, tried ibuprofen and muscle relaxants without much benefit.  She also had some OxyContin left over which she took about 1/2 pill, but that did not help her much and decided to come to the Emergency Department.  While in the ED she was given muscle relaxant, so she was given a total 2 mg of IV Dilaudid, 10 mg of oxycodone and 50 mg of Tramadol without much pain relief.  She was also given 30 mg of intramuscular Norflex and that seemed to help her muscle spasm.  The patient also noticed difficulty swallowing during the course of her neck pain at home and was also having some sore throat.  When I evaluated her, her neck pain was about 8 out of 10 in severity and was very uncomfortable.  We were being asked to evaluate this patient.   PAST MEDICAL HISTORY: 1.  History of diverticulosis.  2.  Gastritis.  3.  Hiatal hernia.  4.  Esophageal ring.  5.  Hypertension.  6.  Diabetes.  7.  Hyperlipidemia.   PAST SURGICAL HISTORY:  Bilateral tubal ligation  and cervical neck surgery at [facility].   ALLERGIES:  ASPIRIN AND HYDROCHLOROTHIAZIDE.   MEDICATIONS AT HOME: 1.  Albuterol inhaled every 6 hours.  2.  Aspirin 81 mg by mouth daily. 3.  Atenolol 50 mg by mouth daily.  4.  Cyclobenzaprine 5 mg by mouth 3 times a day as needed.   5.  Glucosamine 500 mg by mouth daily.  6.  Hydroxyzine 25 mg by mouth 4 times a day as needed.  7.  Insulin Lantus 50 units subQ daily.  8.  Lisinopril 40 mg by mouth daily.  9.  Metformin 1000 mg by mouth twice daily.  10.  Patanol 0.1% eye drops to each eye twice a day.  11.  Prednisone 20 mg by mouth daily for 7 days, followed by 50 mg by mouth daily for 5 days.  12.  Ranitidine 150 mg by mouth twice daily.  13.  Simvastatin 80 mg by mouth at bedtime.  14.  Tessalon Perles 100 mg 3 times a day as needed.  15.  Tramadol 50 mg by mouth every 6 hours as needed.  16.  Vicodin 5/500 mg 1 tablet by mouth every 4 hours as needed.   SOCIAL HISTORY:  She smokes about 6 cigarettes daily for the last 30 to 40 years.  Denies any alcohol use.   FAMILY HISTORY:  Positive for hypertension  and diabetes in her sisters.  So she thinks her mother died of a heart attack.   REVIEW OF SYSTEMS:  CONSTITUTIONAL:  No fever, fatigue, weakness, having significant neck pain.  EYES:  No blurred or double vision.  EARS, NOSE, THROAT:  No tinnitus or ear pain.  She is having difficulty swallowing and some sore throat.  RESPIRATORY:  No cough, wheezing, hemoptysis.  CARDIOVASCULAR:  No chest pain, orthopnea, edema.  Known history of coronary artery disease, status post stenting.  GASTROINTESTINAL:  No nausea, vomiting, diarrhea.  GENITOURINARY:  No dysuria or hematuria.  ENDOCRINE:  No polyuria, nocturia. HEMATOLOGY:  No anemia or easy bruising.  SKIN:  No rash or lesion.  MUSCULOSKELETAL:  Significant neck pain with stiffness.  NEUROLOGIC:  No tingling, numbness, weakness.  Having some dysphagia along with neck pain,  worrisome for possible spinal cord issue.  PSYCHIATRIC:  No history of anxiety or depression.   PHYSICAL EXAMINATION: VITAL SIGNS:  Temperature 98.7, heart rate 127 per minute, respirations 18 per minute, blood pressure 137/74 mmHg.  She is saturating 95% on room air.  GENERAL:  The patient is a 64 year old female lying in the bed comfortably without any acute distress.  EYES:  Pupils equal, round, react to light and accommodation.  No scleral icterus.  Extraocular muscles intact.  HEENT:  Head atraumatic, normocephalic.  Oropharynx and nasopharynx dry and clear.  NECK:  Supple.  No jugular venous distention.  Showed no thyroid enlargement or tenderness.  She does have significant tenderness in her upper neck, lower occipital region of the head, some muscle spasm in the paracervical region of the neck.  LUNGS:  Clear to auscultation bilaterally.  No wheezes, rales, rhonchi or crepitation.  CARDIOVASCULAR:  S1, S2 normal.  No murmurs, rubs or gallop.  ABDOMEN:  Soft, nontender, nondistended.  Bowel sounds present.  No organomegaly or masses. EXTREMITIES:  No pedal edema, cyanosis or clubbing.  NEUROLOGIC:  Cranial nerves II through XII intact.  Muscle strength 5 out of 5 in all extremities.  Sensation intact.  PSYCHIATRIC:  The patient is oriented to time, place and person x 3.  SKIN:  No obvious rash, lesion or ulcer.   LABORATORY AND IMAGING DATA:  Normal BMP except BUN of 31, creatinine 1.48.  Normal liver function tests.  Normal first set of troponin.  Normal CBC.    Chest x-ray showed atelectasis.  No acute finding.  Cervical spine x-ray showed postoperative changes in the cervical, thoracic region.    EKG shows sinus tachycardia with a rate of 118 per minute.  No major ST-T changes.    IMPRESSION AND PLAN:  1.  Severe neck pain with acute onset of swallowing difficulty worrisome for possible spinal cord compression.  Considering her surgery at [facility] and [place] not having any spine specialist  on neurosurgery here, I strongly recommend transferring this patient to a tertiary care center, especially [place] where she has had her surgery done if ER also has a concern for possible spinal cord compression.  Certainly MRI can be done tonight and I have discussed the case with Dr. Cinda Quest along with Dr. Marjean Donna, who is in agreement to do so.  We will not be admitting this patient under hospitalist service at this time due to lack of spine specialty by neurosurgery.  If her MRI is positive, she will need to be transferred to [facility] or any other tertiary care center.  The management plan was discussed with Dr. Conni Slipper and Dr.  Earnestine Leys who are in agreement.  2.  Tachycardia, likely due to uncontrolled pain.  3.  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  She is at her baseline at this time.  We will recommend outpatient monitoring of her kidney functions.  4.  Hyperlipidemia.  Continue statin.   TOTAL TIME TAKING CARE OF THIS PATIENT:  Is 50 minutes.     ____________________________ Lucina Mellow. Manuella Ghazi, MD vss:ea D: 01/30/2013 22:53:28 ET T: 01/30/2013 23:38:18 ET JOB#: [id number]  cc:  Vipul S. Manuella Ghazi, MD, <Dictator> [name], FNP [place], MD  Lucina Mellow [facility] MD ELECTRONICALLY SIGNED 01/31/2013 15:47

## 2015-03-20 NOTE — Discharge Summary (Signed)
PATIENT NAME:  Caitlin Bass, WISSEL MR#:  [id number] DATE OF BIRTH:  01-08-1951  DATE OF ADMISSION:  07/22/2013 DATE OF DISCHARGE:  07/24/2013  ADMITTING DIAGNOSIS:  Acute renal failure.   DISCHARGE DIAGNOSES 1.  Acute on chronic renal, failure likely due to dehydration as well as ACE inhibitor as well as metformin use, resolving on IV fluids.  2.  Hyperkalemia due to renal failure, resolved.  3.  Lactic acidosis likely due to metformin.  4.  Diabetes mellitus type 2, hemoglobin A1c of 9.1.  5.  Rash, suspected viral versus allergic reaction, resolved.  6. History of hypertension.  7.  Coronary artery disease, status post stenting in 2005.  8.  Hyperlipidemia.  9.  Hypertriglyceridemia.  10.  Gastroesophageal reflux disease.  11.  Arthritis.  12.  Hiatal hernia.   DISCHARGE CONDITION: Stable.   DISCHARGE MEDICATIONS 1.  The patient is to resume ranitidine 150 mg p.o. twice daily.  2.  Glucosamine 500 mg p.o. once daily.  3.  Cyclobenzaprine 5 mg 3 times daily as needed.  4.  Aspirin 81 mg p.o. once daily.  5.  Atenolol 50 mg p.o. once daily.  6.  Simvastatin 80 mg p.o. at bedtime.  7.  Zyrtec 10 mg p.o. daily.  8.  Albuterol 2.5 mg in 3 mL inhalation solution every 6 hours as needed.  9.  Lantus 60 units subcutaneous at bedtime. This is a new dose.  10.  Insulin, protamine sliding scale.  11.  Benadryl 25 mg every 6 hours as needed.  12.  Norvasc 2.5 mg p.o. once daily. This is a new medication.  13.  Magnesium oxide 400 mg p.o. twice daily.  14.  Omega 3 polyunsaturated fatty acids 2 grams once daily.  15.  The patient was advised not to take metformin and lisinopril until recommended by MD or nephrology.   HOME OXYGEN: None.   DIET: Two grams salt, low fat, low cholesterol, carbohydrate-controlled diet, regular consistency.   ACTIVITY LIMITATIONS: As tolerated.   FOLLOWUP APPOINTMENT: With Ms. Caitlin Bass in 2 days after discharge as well as Dr. Candiss Bass in 2 days after  discharge. It is recommended to follow the patient's creatinine levels as outpatient in the next few days after discharge.   CONSULTANTS: Dr. Candiss Bass, care management.   RADIOLOGIC STUDIES: Ultrasound of kidneys, bilateral, 25th of August 2014, showed normal renal ultrasound.   HISTORY OF PRESENT ILLNESS:  The patient is a 64 year old African American female with history of chronic renal failure with baseline creatinine of 1.4, who presents to the hospital with complaints of rash. Please refer to Dr. Rinaldo Bass admission note on the 24th of August 2014. On arrival to the hospital, the patient's temperature is 98.1, pulse was 92, respiration rate was 16, blood pressure 147/89, saturation was 96% on room air. Physical exam was unremarkable.   The patient's EKG showed normal sinus rhythm at 91 beats per minute, left axis deviation, septal infarct age undetermined but no acute ST-T changes were noted. The patient's lab data done in the Emergency Room revealed mildly elevated BUN and creatinine of 55 and 3.63, potassium 5.5, bicarbonate of 20, glucose level of 262, otherwise BMP was unremarkable. The patient's CK total was 111. The patient's CBC was within normal limits with white blood cell count of 5.6, hemoglobin 12.1, platelet count (Dictation Anomaly) 342, absolute neutrophil count was normal at 2.7.   HOSPITAL COURSE:   The patient was admitted to the hospital for further evaluation. She  was  started on IV fluids and with this her kidney function improved. By the 27th of August 2014, the patient's BUN and creatinine were 36 and 2.1. The patient was consulted by Dr. Candiss Bass, who felt that the patient is ready to be discharged home with recommendations of advanced p.o. fluid intake. She is to follow up with him for further recommendations. Dr. Candiss Bass did studies evaluating her for proteinuria and microalbuminuria and she was noted to have significant abnormalities including creatinine in the urine of 63.5, protein  creatinine ratio was 724 which was significantly elevated and the patient's protein in urine random was 46 which is also elevated. Dr. Candiss Bass felt that patient very likely has diabetic nephropathy. He also evaluated the patient for hyperlipidemia and hypertriglyceridemia. The patient was noted to have significant hypertriglyceridemia with triglyceride level of 550, total cholesterol level of 291 and HDL of 37. The patient was started on fish oil.  She was recommended to continue her medications for hyperlipidemia with simvastatin. It was felt that the patient would benefit from improvement of her diabetes control. The patient's hemoglobin A1c was checked and was found to be markedly elevated at 9.1. It was felt that the patient is noncompliant with her oral intake as her blood glucose levels ranged in 100s to 120s while she was in the hospital on her usual doses of insulin Lantus. However, since she is returning back home, we decided that we need to advance her diabetic medications and advanced her Lantus to 60 units subcutaneously at bedtime. She is also to use short-acting insulin as needed; however, we felt that patient should discontinue her metformin due to acute on chronic renal failure. It is recommended to advance the patient's insulin Lantus depending on her need.    In regards to rash, we continued the patient on Zyrtec as well as Benadryl as needed and her rash disappeared. We suspected it could have been virologic, at this point unclear etiology.   For hypertension, patient's ACE inhibitor was placed on hold, suspended due to acute on chronic renal failure. The patient was initiated on Norvasc. On the day of discharge, the patient's vital signs were stable with temperature of 98.8, pulse was in 70s, respiratory rate was 17 to 18, blood pressure 114/66, saturation was 97% to 99% on room air at rest.   In regards to coronary artery disease, the patient is to continue her outpatient management. For  gastroesophageal reflux disease, she is to continue ranitidine.  For arthritis, she is to continue glucosamine.    The patient is being discharged in stable condition with above-mentioned medications and followup.   TIME SPENT: 40 minutes.   ____________________________ Theodoro Grist, MD rv:cs D: 07/24/2013 14:25:00 ET T: 07/24/2013 19:12:31 ET JOB#: [id number]  cc: Caitlin Edwards, FNP Theodoro Grist, MD, <Dictator>  Arnell Slivinski MD ELECTRONICALLY SIGNED 08/17/2013 18:09

## 2015-03-20 NOTE — H&P (Signed)
PATIENT NAME:  Caitlin Bass, Caitlin Bass MR#:  [id number] DATE OF BIRTH:  November 15, 1951  DATE OF ADMISSION:  07/21/2013  PRIMARY CARE PHYSICIAN: [name], FNP  REFERRING PHYSICIAN: Dr. Dahlia Client.   CHIEF COMPLAINT: Rash.   HISTORY OF PRESENT ILLNESS: The patient is a 64 year old African American female who has a chronic history of renal insufficiency (not on dialysis), diabetes mellitus, hypertension, hyperlipidemia and coronary artery disease status post stent who is presenting to the ER after developing rash on both upper and lower extremities. Apparently the patient got a phone call from her primary care physician on Friday evening stating that her renal function is abnormal and she has to continue monitoring her lower extremities and if she notices any weight gain or if she developed shortness of breath she has go to the nearest ER. After she developed rash, she thought she needed to be checked out as recommended by her primary care physician and came to the ER. In the ER, the lab work has revealed a potassium of 5.5 and creatinine 3.63 with BUN 55. Her BUN in March was 31, creatinine 1.48. EKG did not reveal any peaked T waves. She was given IV fluid bolus and 1 dose of Kayexalate, and the hospitalist team is called to admit the patient for hyperkalemia and acute kidney injury. During my examination, the patient denies any chest pain, shortness of breath, abdominal pain, nausea, vomiting, diarrhea. Denies any back pain or any other complaints. She is concerned about the rash on both her upper and lower extremities. She has received Benadryl in the ER. No family members at bedside.   PAST MEDICAL HISTORY: Coronary artery disease status post stent in the year 2004, type 2 diabetes mellitus, hypertension, GERD, arthritis, hiatal hernia, hyperlipidemia.   PAST SURGICAL HISTORY: Status post stent placement in the year 2004.  ALLERGIES: ASPIRIN AND HYDROCHLOROTHIAZIDE.   PSYCHOSOCIAL HISTORY: She lives with a  roommate. Smokes less than half pack a day. Denies alcohol or illicit drug usage.   FAMILY HISTORY: Significant for hypertension and diabetes. Mother had history of MI.  REVIEW OF SYSTEMS:  CONSTITUTIONAL: Denies any fever, fatigue.  EYES: Denies blurry vision, glaucoma.  EARS, NOSE, THROAT: No epistaxis or discharge.  CARDIOVASCULAR: No chest pain, orthopnea.  GASTROINTESTINAL: No nausea, vomiting, diarrhea.  GENITOURINARY: No dysuria, hematuria. GYN AND BREASTS: Denies breast mass or vaginal discharge.  ENDOCRINE: No polyuria or nocturia. Has history of diabetes mellitus.  HEMATOLOGIC AND LYMPHATIC: Chronic anemia is present. No easy bruising.  INTEGUMENTARY: No acne. Developed a rash on upper and lower extremities, which is maculopapular. No discharge.  MUSCULOSKELETAL: No joint pain and denies gout.  NEUROLOGIC: No vertigo or ataxia.  PSYCHIATRIC: No ADD, OCD.   HOME MEDICATIONS:  1. Albuterol as needed basis.  2.  Simvastatin 80 mg once daily. 3.  Ranitidine 150 mg 2 times a day. 4.  Lisinopril 40 mg once daily. 5.  Lantus 50 units sub-Q once daily. 6.  Cyclobenzaprine 75 mg 3 times a day. 7.  Atenolol 50 mg once a day. 8.  Aspirin 81 mg once daily.   PHYSICAL EXAMINATION: VITAL SIGNS: Temperature 98.1, pulse 92, respirations 16, blood pressure 147/89, pulse ox 96%. GENERAL APPEARANCE: Not in acute distress. Moderately built and nourished.  HEENT: Normocephalic, atraumatic. Pupils are equally reacting to light and accommodation. No scleral icterus. No conjunctival injection. No sinus tenderness. No postnasal drip.  NECK: Supple. No JVD. No thyromegaly. No lymphadenopathy.  LUNGS: Clear to auscultation bilaterally. No accessory muscle use.  No anterior chest wall tenderness on palpation.  HEART:  S1, S2 normal. Regular rate and rhythm. No murmurs.  ABDOMEN: Soft. Bowel sounds are positive in all 4 quadrants. Nontender, nondistended. No hepatosplenomegaly.  NEUROLOGIC: Awake,  alert and oriented x 3. Cranial nerves II through XII are grossly intact. Motor and sensory intact. Reflexes are 2+.  EXTREMITIES: No edema. No cyanosis. No clubbing.  SKIN: Warm to touch. Normal turgor. No lesions but maculopapular rash is noticed on both upper and lower extremities with no vesicles or papules. No discharge. PSYCHIATRIC: Normal mood and affect.  MUSCULOSKELETAL: No joint effusion, tenderness, erythema.   LABORATORY DATA AND IMAGING STUDIES: CBC is pending, which is ordered. Glucose 262, BUN 55, creatinine 3.63, potassium 5.5. Her BUN was 31 and creatinine 1.48 in March 2014. GFR is 15. Chloride 109. Serum osmolality 298. Calcium 9.2.    Twelve lead EKG: Normal sinus rhythm at 91 beats per minute, normal PR and QRS interval. Left axis deviation. Septal infarct, age undetermined.  An ultrasound is ordered, which is pending.   ASSESSMENT AND PLAN:  A 64 year old African American female presenting to the ER after she developed a rash. Will be admitted with the following assessment and plan:  1.  Acute kidney injury, on chronic kidney disease, probably prerenal versus renal. Will provide her IV fluids. One bolus was given in the ER. We will get a renal ultrasound, avoid nephrotoxins and hold ACE inhibitor if necessary. Will put in a nephrology consult.  2.  Hyperkalemia, mild. Status post Kayexalate in ER. Will repeat potassium at 10:00 a.m. today.  3.  Rash, etiology unclear. We will provide her Benadryl as needed basis.  4.  Chronic history of diabetes mellitus. Continue Lantus and check Hemoglobin A1c. The patient will be on sliding scale insulin for hyperglycemia.  5.  Hypertension. Resume her home medications including atenolol, but will hold on ACE inhibitor in view of acute kidney injury. 6.  Coronary artery disease status post stent. Denies any chest pain. Continue aspirin, beta blocker and statin.  7.  We will provide gastrointestinal prophylaxis with ranitidine and deep vein  thrombosis prophylaxis with heparin sub-Q.   Diagnosis and plan of care was discussed in detail with the patient. She is aware of the plan.   TOTAL TIME SPENT ON ADMISSION: 45 minutes.   ____________________________ Nicholes Mango, MD ag:sb D: 07/22/2013 07:25:10 ET T: 07/22/2013 07:52:00 ET JOB#: [id number]  cc: Nicholes Mango, MD, <Dictator> Nicholes Mango MD ELECTRONICALLY SIGNED 07/23/2013 6:58

## 2015-03-21 NOTE — Consult Note (Signed)
General Aspect Cardiologist: None. used to see Dr. Clayborn Bigness in the past.  Resaon for consult: NSTEMI.   Caitlin Bass is a 64 year old African American female with past medical history significant for coronary artery disease, status post prior drug stent placement to the left circumflex in 2004, insulin-dependent diabetes mellitus, hypertension, CKD, gastroesophageal reflux disease with peptic ulcer disease, hiatal hernia, neck surgery in the past, presents to the hospital secondary to worsening chest pain. The patient's states almost for the past month she has a heaviness in the right side of her chest that was going down her right arm. Because it was on the right side, she did not think it was her heart and just thought it was like a muscle pain. However, the frequency of her pain has been getting worse, that even with the slightest pyysical she feels the heaviness.   Most recent cardiac cath in 2006 showed patent LCX stent with 50% distal LAD stenosis.   Physical Exam:  GEN no acute distress   NECK supple   RESP normal resp effort  clear BS   CARD Regular rate and rhythm  No murmur   ABD denies tenderness   LYMPH negative neck   EXTR negative cyanosis/clubbing, negative edema   SKIN normal to palpation   NEURO cranial nerves intact   PSYCH alert, A+O to time, place, person   Review of Systems:  Subjective/Chief Complaint chest pain.   General: Fatigue   Skin: No Complaints   ENT: No Complaints   Eyes: No Complaints   Neck: No Complaints   Cardiovascular: Tightness   Gastrointestinal: No Complaints   Genitourinary: No Complaints   Vascular: No Complaints   Musculoskeletal: No Complaints   Neurologic: No Complaints   Hematologic: No Complaints   Endocrine: No Complaints   Psychiatric: No Complaints   Medications/Allergies Reviewed Medications/Allergies reviewed   Family & Social History:  Family and Social History:  Family History Coronary Artery  Disease   Social History positive tobacco (Greater than 1 year), negative ETOH   Home Medications: Medication Instructions Status  lisinopril 40 mg oral tablet 1 tab(s) orally once a day for high blood pressure. Active  atorvastatin 40 mg oral tablet 1 tab(s) orally once a day (at bedtime) Active  HumaLOG KwikPen 100 units/mL subcutaneous solution 5 unit(s) subcutaneous 2 times a day (after meals) Active  Lantus Solostar Pen 100 units/mL subcutaneous solution 64 unit(s) subcutaneous once a day (at bedtime) Active  magnesium oxide 400 mg oral tablet 1 tab(s) orally 2 times a day Active  amLODIPine 10 mg oral tablet 1 tab(s) orally once a day for high blood pressure. Active  ranitidine 150 mg oral tablet 1 tab(s) orally 2 times a day Active  carvedilol 12.5 mg oral tablet 1 tab(s) orally 2 times a day Active  GlipiZIDE XL 5 mg oral tablet, extended release 1 tab(s) orally once a day (in the morning) Active   Lab Results: Hepatic:  29-May-15 12:37   Bilirubin, Total 0.2  Alkaline Phosphatase 108 (45-117 NOTE: New Reference Range 10/18/13)  SGPT (ALT) 27  SGOT (AST) 19  Total Protein, Serum 7.5  Albumin, Serum 3.6  Routine Chem:  29-May-15 12:37   Result Comment TROPONIN - RESULTS VERIFIED BY REPEAT TESTING.  - CALLED TO LINDA MCLAMB AT 1333 ON  - 04/25/14 BY [facility]  - READ-BACK PROCESS PERFORMED.  Result(s) reported on 25 Apr 2014 at 01:39PM.  Glucose, Serum  268  BUN  37  Creatinine (comp)  1.89  Sodium, Serum  134  Potassium, Serum 5.1  Chloride, Serum 106  CO2, Serum  18  Calcium (Total), Serum 9.3  Osmolality (calc) 286  eGFR (African American)  32  eGFR (Non-African American)  28 (eGFR values <72m/min/1.73 m2 may be an indication of chronic kidney disease (CKD). Calculated eGFR is useful in patients with stable renal function. The eGFR calculation will not be reliable in acutely ill patients when serum creatinine is changing rapidly. It is not useful in  patients on  dialysis. The eGFR calculation may not be applicable to patients at the low and high extremes of body sizes, pregnant women, and vegetarians.)  Anion Gap 10  Lipase  740 (Result(s) reported on 25 Apr 2014 at 01:34PM.)    18:20   Result Comment TROPONIN - RESULTS VERIFIED BY REPEAT TESTING.  - HIGH PREVIOUSLY CALLED BY [facility] AT 1333 ON  - 04-25-14/[facility]  Result(s) reported on 25 Apr 2014 at 07:29PM.    20:35   Result Comment TROPONIN - RESULTS VERIFIED BY REPEAT TESTING.  - HIGH PREVIOUSLY CALLED BY [facility] AT 1333 ON  - 04-25-14/[facility]  Result(s) reported on 25 Apr 2014 at 09:32PM.  Cardiac:  29-May-15 12:37   Troponin I  0.30 (0.00-0.05 0.05 ng/mL or less: NEGATIVE  Repeat testing in 3-6 hrs  if clinically indicated. >0.05 ng/mL: POTENTIAL  MYOCARDIAL INJURY. Repeat  testing in 3-6 hrs if  clinically indicated. NOTE: An increase or decrease  of 30% or more on serial  testing suggests a  clinically important change)    13:58   CPK-MB, Serum 2.6 (Result(s) reported on 25 Apr 2014 at 02:33PM.)    18:20   Troponin I  0.37 (0.00-0.05 0.05 ng/mL or less: NEGATIVE  Repeat testing in 3-6 hrs  if clinically indicated. >0.05 ng/mL: POTENTIAL  MYOCARDIAL INJURY. Repeat  testing in 3-6 hrs if  clinically indicated. NOTE: An increase or decrease  of 30% or more on serial  testing suggests a  clinically important change)  CK, Total 169 (26-192 NOTE: NEW REFERENCE RANGE  12/30/2013)  CPK-MB, Serum 2.3 (Result(s) reported on 25 Apr 2014 at 07:18PM.)    20:35   Troponin I  0.30 (0.00-0.05 0.05 ng/mL or less: NEGATIVE  Repeat testing in 3-6 hrs  if clinically indicated. >0.05 ng/mL: POTENTIAL  MYOCARDIAL INJURY. Repeat  testing in 3-6 hrs if  clinically indicated. NOTE: An increase or decrease  of 30% or more on serial  testing suggests a  clinically important change)  CK, Total 162 (26-192 NOTE: NEW REFERENCE RANGE  12/30/2013)  CPK-MB, Serum 2.0 (Result(s) reported on 25 Apr 2014  at 09:28PM.)  Routine UA:  29-May-15 13:58   Color (UA) Yellow  Clarity (UA) Clear  Glucose (UA) 50 mg/dL  Bilirubin (UA) Negative  Ketones (UA) Negative  Specific Gravity (UA) 1.014  Blood (UA) Negative  pH (UA) 5.0  Protein (UA) 100 mg/dL  Nitrite (UA) Negative  Leukocyte Esterase (UA) Trace (Result(s) reported on 25 Apr 2014 at 02:56PM.)  RBC (UA) NONE SEEN  WBC (UA) 1 /HPF  Bacteria (UA) NONE SEEN  Epithelial Cells (UA) 2 /HPF (Result(s) reported on 25 Apr 2014 at 02:56PM.)  Routine Coag:  29-May-15 12:37   Activated PTT (APTT) 34.4 (A HCT value >55% may artifactually increase the APTT. In one study, the increase was an average of 19%. Reference: "Effect on Routine and Special Coagulation Testing Values of Citrate Anticoagulant Adjustment in Patients with High HCT Values." American Journal of Clinical Pathology 2006;126:400-405.)  Prothrombin 13.0  INR 1.0 (INR reference interval applies to patients on anticoagulant therapy. A single INR therapeutic range for coumarins is not optimal for all indications; however, the suggested range for most indications is 2.0 - 3.0. Exceptions to the INR Reference Range may include: Prosthetic heart valves, acute myocardial infarction, prevention of myocardial infarction, and combinations of aspirin and anticoagulant. The need for a higher or lower target INR must be assessed individually. Reference: The Pharmacology and Management of the Vitamin K  antagonists: the seventh ACCP Conference on Antithrombotic and Thrombolytic Therapy. ERXVQ.0086 Sept:126 (3suppl): [id number]. A HCT value >55% may artifactually increase the PT.  In one study,  the increase was an average of 25%. Reference:  "Effect on Routine and Special Coagulation Testing Values of Citrate Anticoagulant Adjustment in Patients with High HCT Values." American Journal of Clinical Pathology 7619;509:326-712.)    20:35   Activated PTT (APTT)  53.7 (A HCT value >55% may  artifactually increase the APTT. In one study, the increase was an average of 19%. Reference: "Effect on Routine and Special Coagulation Testing Values of Citrate Anticoagulant Adjustment in Patients with High HCT Values." American Journal of Clinical Pathology 2006;126:400-405.)  Routine Hem:  29-May-15 12:37   WBC (CBC) 6.1  RBC (CBC) 4.16  Hemoglobin (CBC)  11.8  Hematocrit (CBC) 35.8  Platelet Count (CBC) 329  MCV 86  MCH 28.3  MCHC 32.8  RDW  15.0  Neutrophil % 49.9  Lymphocyte % 38.7  Monocyte % 8.3  Eosinophil % 1.8  Basophil % 1.3  Neutrophil # 3.1  Lymphocyte # 2.4  Monocyte # 0.5  Eosinophil # 0.1  Basophil # 0.1 (Result(s) reported on 25 Apr 2014 at 01:11PM.)   EKG:  EKG NSR   Interpretation lateral TW changes suggestive of ischemia   Radiology Results: XRay:    29-May-15 12:59, Chest PA and Lateral  Chest PA and Lateral   REASON FOR EXAM:    epigastric pain cardiac hx  COMMENTS:       PROCEDURE: DXR - DXR CHEST PA (OR AP) AND LATERAL  - Apr 25 2014 12:59PM     CLINICAL DATA:  CHF.  Epigastric/ chest pain for 1 month.  Smoker.    EXAM:  CHEST  2 VIEW    COMPARISON:  01/30/2013    FINDINGS:  Moderate thoracic spondylosis. Midline trachea. Borderline  cardiomegaly. Tortuous thoracic aorta. No pleural effusion or  pneumothorax. Clear lungs.     IMPRESSION:  No acute cardiopulmonary disease.      Electronically Signed    By: Abigail Miyamoto M.D.    On: 04/25/2014 13:21         Verified By: Areta Haber, M.D.,    ASA: Unknown  Hydrochlorothiazide: Unknown  Vital Signs/Nurse's Notes: **Vital Signs.:   29-May-15 18:08  Vital Signs Type Admission  Temperature Temperature (F) 98.1  Celsius 36.7  Temperature Source oral  Pulse Pulse 77  Respirations Respirations 18  Systolic BP Systolic BP 458  Diastolic BP (mmHg) Diastolic BP (mmHg) 80  Mean BP 97  Pulse Ox % Pulse Ox % 96  Pulse Ox Activity Level  At rest  Oxygen Delivery Room Air/  21 %    19:18  Vital Signs Type Routine  Temperature Temperature (F) 98.4  Celsius 36.8  Temperature Source oral  Pulse Pulse 90  Respirations Respirations 19  Systolic BP Systolic BP 099  Diastolic BP (mmHg) Diastolic BP (mmHg) 76  Mean BP 95  Pulse Ox % Pulse  Ox % 99  Pulse Ox Activity Level  At rest  Oxygen Delivery Room Air/ 21 %    Impression 1. NSTEMI: history of very concerning for crescendo angina with known history of CAD and previous LCX stent. Multiple risk factors for CAD including prolonged diabets.  Agree with Heparin, BB and a statin.  She reports strong intolerance to Aspirin due to gastic ulcers. Can use Plavix.  Recommend cardiac cath on Monday. Risks and benefits were explained especially contrast induced nephropathy given CKD. I requested an echo to evaluate EF in order to avoid LV gram.   2. CKD: seems stable around baseline.   3. Hypertension: BP is controlled.   4. Hyperlipidemia: continue statin.   Electronic Signatures: Kathlyn Sacramento (MD)  (Signed 29-May-15 22:49)  Authored: General Aspect/Present Illness, History and Physical Exam, Review of System, Family & Social History, Home Medications, Labs, EKG , Radiology, Allergies, Vital Signs/Nurse's Notes, Impression/Plan   Last Updated: 29-May-15 22:49 by Kathlyn Sacramento (MD)

## 2015-03-21 NOTE — Discharge Summary (Signed)
PATIENT NAME:  Caitlin Bass, Caitlin Bass MR#:  [id number] DATE OF BIRTH:  02-22-1951  DATE OF ADMISSION:  05/09/2014 DATE OF DISCHARGE:  05/10/2014  PRIMARY CARE PHYSICIAN:  [facility].   FINAL DIAGNOSES: 1.  Syncope and hypotension.  2.  Acute renal failure on chronic kidney disease, hyperkalemia.  3.  Urinary tract infection.  4.  Diabetes.  5.  Hyperlipidemia.  6.  Guaiac-positive stool.   The patient was admitted 05/09/2014 and discharged 05/10/2014.  The patient came in with a syncopal episode where she did not eat breakfast.  She was working in the yard, went to eat lunch and was eating and saw some lights and then felt lightheaded, dizzy and passed out.  She was found to be in acute on chronic renal failure with hyperkalemia.  The patient was given IV fluid hydration, hyperkalemia was treated.   LABORATORY AND RADIOLOGICAL DATA DURING THE HOSPITAL COURSE:  Included PT, INR, PTT within normal range.  CPK normal.  Glucose 186, BUN 34, creatinine 2.17, sodium 134, potassium 6.3, chloride 107, CO2 25, calcium 9.2.  Liver function tests normal range.  Troponin negative.  White blood cell count 6.8, hemoglobin and hematocrit of 10.9 and 33.7, platelet count of 356.  Chest x-ray was negative.  Urinalysis, 2+ leukocyte esterase.  Repeat chemistry showed a potassium of 4.8, creatinine 1.97.  Upon discharge, potassium 4.5, creatinine 1.92, hemoglobin 9.7, white count 7.8, platelet count of 337.  Looking back at old labs, creatinine has been as high as 2.1 in the past also.  HOSPITAL COURSE PER PROBLEM LIST:  1.  For the patient's syncope, the patient initially had hypotension.  The patient was given IV fluid hydration.  The patient was not orthostatic and felt better.  Did still have a little bit of weakness.  Blood pressure upon discharge 133/66.  I did cut the patient's lisinopril in half.  2.  Acute renal failure on chronic kidney disease with hyperkalemia.  The patient was treated with  Kayexalate and IV medications for the hyperkalemia, given IV fluids.  Potassium improved to a level of 4.5.  I did decrease the lisinopril down to 1/2 tablet daily.  3.  UTI.  Urinalysis was positive.  No urine culture was sent.  I did treat empirically with Cipro.  4.  Diabetes.  No changes in Lantus made.  5.  History of heart disease, on atorvastatin, aspirin, Coreg.  6.  Hyperlipidemia, on atorvastatin.  7.  Guaiac-positive stool.  Hemoglobin remained relatively stable with vigorous IV fluid hydration that was given overnight.  Work-up can be done as outpatient if indicated.  Recommend checking a repeat CBC as outpatient.    Time spent on discharge 35 minutes.    ____________________________ Tana Conch. Leslye Peer, MD rjw:ea D: 05/10/2014 15:09:24 ET T: 05/11/2014 02:25:27 ET JOB#: [id number]  cc: Tana Conch. Leslye Peer, MD, <Dictator> [facility] [place] A. Fletcher Anon, MD Marisue Brooklyn MD ELECTRONICALLY SIGNED 05/11/2014 12:45

## 2015-03-21 NOTE — Consult Note (Signed)
Chief Complaint:  Subjective/Chief Complaint She denies any chest pain or SOB.   VITAL SIGNS/ANCILLARY NOTES: **Vital Signs.:   31-May-15 11:45  Vital Signs Type Routine  Temperature Temperature (F) 98  Celsius 36.6  Temperature Source oral  Pulse Pulse 77  Respirations Respirations 20  Systolic BP Systolic BP 579  Diastolic BP (mmHg) Diastolic BP (mmHg) 73  Mean BP 94  Pulse Ox % Pulse Ox % 98  Pulse Ox Activity Level  At rest  Oxygen Delivery Room Air/ 21 %  *Intake and Output.:   Shift 31-May-15 15:00  Grand Totals Intake:  480 Output:  400    Net:  80 24 Hr.:  80  Urine ml     Out:  400  Length of Stay Totals Intake:  3890.4 Output:  3225    Net:  665.4   Lab Results: Routine Chem:  31-May-15 04:16   Glucose, Serum  142  BUN  32  Creatinine (comp)  1.59  Sodium, Serum 138  Potassium, Serum 4.2  Chloride, Serum  111  CO2, Serum 22  Calcium (Total), Serum 8.7  Anion Gap  5  Osmolality (calc) 285  eGFR (African American)  40  eGFR (Non-African American)  34 (eGFR values <43m/min/1.73 m2 may be an indication of chronic kidney disease (CKD). Calculated eGFR is useful in patients with stable renal function. The eGFR calculation will not be reliable in acutely ill patients when serum creatinine is changing rapidly. It is not useful in  patients on dialysis. The eGFR calculation may not be applicable to patients at the low and high extremes of body sizes, pregnant women, and vegetarians.)  Routine Coag:  31-May-15 04:16   Activated PTT (APTT)  44.2 (A HCT value >55% may artifactually increase the APTT. In one study, the increase was an average of 19%. Reference: "Effect on Routine and Special Coagulation Testing Values of Citrate Anticoagulant Adjustment in Patients with High HCT Values." American Journal of Clinical Pathology 2006;126:400-405.)    10:47   Activated PTT (APTT)  42.2 (A HCT value >55% may artifactually increase the APTT. In one study, the  increase was an average of 19%. Reference: "Effect on Routine and Special Coagulation Testing Values of Citrate Anticoagulant Adjustment in Patients with High HCT Values." American Journal of Clinical Pathology 2006;126:400-405.)   Radiology Results: Cardiology:    30-May-15 08:38, Echo Doppler  Echo Doppler   REASON FOR EXAM:      COMMENTS:       [facility]  - Apr 26 2014  8:38AM     RESULT: Echocardiogram Report    Patient Name:   Caitlin LaosDate of Exam: 04/26/2014  Medical Rec #:  [id number]            Custom1:  Date of Birth:  1Jun 23, 1952        Height:       65.0 in  Patient Age:    89years           Weight:       149.0 lb  Patient Gender: F                  BSA:          1.75 m??    Indications: MI  Sonographer:    TArville GoRDCS  Referring Phys: KGladstone Lighter   Sonographer Comments: Technically difficult study due to poor echo   windows.    Summary:  1. Left ventricular ejection fraction, by visual estimation, is 55 to   60%.   2. Normal global left ventricular systolic function.   3. Impaired relaxation pattern of LV diastolic filling.   4. Mild concentric left ventricular hypertrophy.   5. Mildly dilated left atrium.   6. Trivial pericardial effusion.   7. Mild mitral stenosis.   8. Moderate thickening and calcification of the anterior and posterior     mitral valve leaflets.   9. Moderate to severe mitral annular calcification.  2D AND M-MODE MEASUREMENTS (normal ranges within parentheses):  Left Ventricle:          Normal  IVSd (2D):      1.15 cm (0.7-1.1)  LVPWd (2D):     1.27 cm (0.7-1.1) Aorta/LA:                  Normal  LVIDd (2D):     4.19 cm (3.4-5.7) Aortic Root (2D): 2.90 cm (2.4-3.7)  LVIDs (2D):     2.99 cm           Left Atrium (2D): 3.50 cm (1.9-4.0)  LV FS (2D):     28.6 %   (>25%)  LV EF (2D):     55.6 % (>50%)                                    Right Ventricle:                                     RVd (2D):  SPECTRAL DOPPLER ANALYSIS (where applicable):  Aortic Valve: AoV Max Vel: 1.89 m/s AoV Peak PG: 14.3 mmHg AoV Mean PG:  LVOT Vmax: 1.71 m/s LVOTVTI:  LVOT Diameter: 2.00 cm  AoV Area, Vmax: 2.84 cm?? AoV Area, VTI:  AoV Area, Vmn:  Pulmonic Valve:  PV Max Velocity: 1.38 m/s PV Max PG: 7.6 mmHg PV Mean PG:    PHYSICIAN INTERPRETATION:  Left Ventricle: The left ventricular internal cavity size was normal.   Mild concentric left ventricular hypertrophy. Global LV systolic function   was normal. Left ventricular ejection fraction, by visual estimation, is   55 to 60%. Spectral Doppler shows impaired relaxation pattern of LV   diastolic filling.  Right Ventricle: Normal right ventricular size, wall thickness, and   systolic function.  Left Atrium: The left atrium is mildly dilated.  Right Atrium: The right atrium is normal in size and structure.  Pericardium: Trivial pericardial effusion is present.  Mitral Valve: There is moderate thickening and calcification of the   anterior and posterior mitral valve leaflets. Moderate to severe mitral   annular calcification. Mild mitral valve stenosis. Trace mitral valve   regurgitation is seen.  Tricuspid Valve: The tricuspid valve is not well seen. Trivial tricuspid   regurgitation is visualized.  Aortic Valve: The aortic valve is tricuspid. The aortic valve is   structurally normal, with no evidence of sclerosis or stenosis. Trivial   aortic valve regurgitation is seen.  Pulmonic Valve: The pulmonic valve is not well seen.  Aorta: The aortic root is normal in size and structure.  Venous: The inferior vena cava was normal.    [id number] Kathlyn Sacramento MD  Electronically signed by [id number] Kathlyn Sacramento MD  Signature Date/Time: 04/26/2014/6:18:53 PM    *** Final ***    IMPRESSION: .  Verified By: Rogue Jury A. Fletcher Anon, M.D., MD   Assessment/Plan:  Assessment/Plan:  Assessment NQWMI:  Cardiac cath on Monday.    Continue current therapy.   Note EF is OK.    CKD:  Creat improved.  We will limit contrast and hydrate prior to cath.  Patient understands the risk of progressive renal dysfunction or failure.  She has been seen by renal.    ELEVATED LIPASE:  Per primary team.   Electronic Signatures: Minus Breeding (MD)  (Signed 31-May-15 13:57)  Authored: Chief Complaint, VITAL SIGNS/ANCILLARY NOTES, Lab Results, Radiology Results, Assessment/Plan   Last Updated: 31-May-15 13:57 by Minus Breeding (MD)

## 2015-03-21 NOTE — H&P (Signed)
PATIENT NAME:  Caitlin Bass, Caitlin Bass MR#:  [id number] DATE OF BIRTH:  Nov 04, 1951  DATE OF ADMISSION:  04/25/2014  ADMITTING PHYSICIAN:  Gladstone Lighter, M.D.   PRIMARY CARE PHYSICIAN: Dr. Humphrey Rolls at [facility].   CHIEF COMPLAINT: Chest pain.   HISTORY OF PRESENT ILLNESS: Caitlin Bass is a 64 year old African American female with past medical history significant for coronary artery disease, status post prior stent placement, insulin-dependent diabetes mellitus, hypertension, gastroesophageal reflux disease with peptic ulcer disease, hiatal hernia, neck surgery in the past, presents to the hospital secondary to worsening chest pain. The patient's states almost for the past month she has a heaviness in the right side of her child that was going down her right arm. Because it was on the right side, she did not think it was her heart and just thought it was like a muscle pain. However, the frequency of her pain has been getting worse, that even with the slightest moment she feels the heaviness. She feels more tired, fatigued all the time, wants to rest in bed most of the time. The duration of the heaviness also has been lasting longer over the last month. She got concerned and presented to the ER. Her first troponin is elevated at 0.30, so she is being admitted for possible NSTEMI.   PAST MEDICAL HISTORY: 1.  Coronary artery disease, status post stents.  2.  Type 2 insulin-dependent diabetes mellitus.  3.  Hypertension.  4.  CKD stage IV.  5.  Gastroesophageal reflux disease.  6.  Degenerative disk disease. 7.  Arthritis. 8.  Hiatal hernia.  9.  Hyperlipidemia.   PAST SURGICAL HISTORY:  1.  Stent placement.  2.  Neck surgery for degenerative disk disease and removal of a spinal cyst.   ALLERGIES TO MEDICATIONS:  1.  ASPIRIN; SHE WAS ASKED TO STAY AWAY FROM ASPIRIN DUE TO PEPTIC ULCER DISEASE IN THE PAST.  2.  HYDROCHLOROTHIAZIDE.   CURRENT HOME MEDICATIONS:  1.  Lisinopril 40 mg p.o. daily.   2.  Glipizide 5 mg p.o. q.a.m.  3.  Magnesium oxide 400 mg p.o. b.i.d.  4.  Atorvastatin 40 mg p.o. at bedtime.  5.  Ranitidine 150 mg p.o. b.i.d.  6.  Coreg 12.5 mg p.o. b.i.d.  7.  Norvasc 10 mg p.o. daily.  8.  Lantus 64 units subQ at bedtime.  9.  Humalog 5 units t.i.d.   SOCIAL HISTORY: Lives at home with a roommate.  Smokes about 1/2 pack per day. No alcohol or drug use.   FAMILY HISTORY: Significant for heart disease in mother.   REVIEW OF SYSTEMS:  CONSTITUTIONAL: No fever. Positive for fatigue and weakness.  EYES: No blurred vision, double vision, inflammation or glaucoma.  ENT: No tinnitus, ear pain, hearing loss, epistaxis or discharge.  RESPIRATORY: No cough, wheeze, hemoptysis or COPD.  CARDIOVASCULAR: Positive for chest pain. No orthopnea. Positive for dyspnea on exertion. No edema, arrhythmia, palpitations or syncope.  GASTROINTESTINAL: No nausea, vomiting, diarrhea, abdominal pain, hematemesis or melena.  GENITOURINARY:   No dysuria, hematuria, renal calculus, frequency or incontinence.  ENDOCRINE: No polyuria, nocturia, thyroid problems, heat or cold intolerance.  HEMATOLOGY: No anemia, easy bruising or bleeding.  SKIN: No acne, rash or lesions.  MUSCULOSKELETAL: Positive for neck pain and shoulder pain. Positive for arthritis. No gout.  NEUROLOGIC: No numbness, weakness, CVA, TIA or seizures.  PSYCHOLOGICAL: No anxiety, insomnia or depression.   PHYSICAL EXAMINATION: VITAL SIGNS: Temperature 98.6 degrees Fahrenheit, pulse 85, respirations 16, blood pressure 128/69,  pulse ox 99% on room air.  GENERAL: Well-built, well-nourished female lying in bed, not in any acute distress.  HEENT:  Normocephalic, atraumatic. Pupils equal, round and reacting to light. Anicteric sclerae. Extraocular movements intact.  Oropharynx is clear without erythema, mass or exudates.  NECK: Supple. No thyromegaly, JVD or carotid bruits. No lymphadenopathy.  LUNGS: Moving air bilaterally. No  wheeze or crackles. No use of accessory muscles for breathing.  CARDIOVASCULAR: S1, S2, regular rate and rhythm. No murmurs, rubs or gallops. No tenderness on touch in the costochondral region.  ABDOMEN: Soft, nontender, nondistended. No hepatosplenomegaly. Normal bowel sounds.  EXTREMITIES: No pedal edema. No clubbing or cyanosis. 2+ dorsalis pedis pulses palpable bilaterally.  SKIN: No acne, rash or lesions.  LYMPHATICS: No cervical or inguinal lymphadenopathy.  NEUROLOGIC:  Cranial nerves intact. No focal motor or sensory deficits.  PSYCHIATRIC:   There is awake, alert, oriented x 3.   LABORATORY DATA: WBC 6.1, hemoglobin 11.8, hematocrit 35.8, platelet count 329.   Sodium 134, potassium 5.1, chloride 106, bicarbonate 18, BUN 37, creatinine 1.89, glucose 268 and calcium of 9.3.   ALT 27, AST 19, alk phos 108, total bili 0.2, albumin is 3.6.  Lipase is slightly elevated at 740. INR is 1.0. PTT is 34.4. Troponin is elevated at 0.30. CK-MB is 2.6. Chest x-ray showing no acute cardiopulmonary abnormality. Urinalysis negative for any infection. EKG showing normal sinus rhythm, heart rate of 82. ST depressions with T wave inversions noted in I and aVL leads.   ASSESSMENT AND PLAN: This is a 65 year old female with prior heart disease and stents put in, diabetes, hypertension, comes with chest pain.  1.  Non- ST segment elevation myocardial infarction. We will admit to telemetry, recycle troponins. However, her first set of troponin is elevated, also with underlying CKD. We have to make sure this is a true cardiac event. We will start her on heparin drip. Cardiology consult, nitro, aspirin, Coreg, statin and lisinopril and if it goes up, she will need a cardiac catheterization. If MI is ruled out with her elevated lipase, not sure if a GI workup has to be done, especially with right upper quadrant ultrasound, because it is a right-sided pain radiating to the right shoulder, to make sure this does not  have any gallstones or cholecystitis.  2.  Diabetes mellitus on Lantus, glipizide; to continue that.  3.  Hypertension. Continue home meds.  4.  Gastroesophageal reflux disease on ranitidine.   CODE STATUS: FULL CODE.   Time spent on admission is 50.     ____________________________ Gladstone Lighter, MD rk:dmm D: 04/25/2014 15:49:26 ET T: 04/25/2014 19:52:08 ET JOB#: [id number]  cc: Gladstone Lighter, MD, <Dictator> [facility] Gladstone Lighter MD ELECTRONICALLY SIGNED 05/10/2014 14:13

## 2015-03-21 NOTE — Discharge Summary (Signed)
PATIENT NAME:  Caitlin Bass, Caitlin Bass MR#:  [id number] DATE OF BIRTH:  01/27/1951  DATE OF ADMISSION:  04/25/2014 DATE OF DISCHARGE:  04/29/2014  ADMISSION DIAGNOSIS: Non-ST elevation myocardial infarction.  DISCHARGE DIAGNOSES:  1. Non-ST elevation myocardial infarction  2. Diabetes. 3. Hypertension. 4. Gastroesophageal reflux disease. 5. Chronic kidney disease. 6. Tobacco dependence.   CONSULTATIONS:  1. Dr. Fletcher Anon. 2. Dr. [name] from nephrology.   LABORATORIES AT DISCHARGE:  Sodium 138, potassium 4.8, chloride 109, bicarbonate 21, BUN 27, creatinine 1.54, glucose is 187.  White blood cells 8.1, hemoglobin 11, hematocrit 33, platelets are 312.  Cardiac catheterization EF of 55%. Coronary arteries are overall tortuous and calcified. She has severe 1-vessel coronary artery disease. The distal dominant left circumflex is occluded with left-to-right collaterals. The distal LAD has a 60% calcified stenosis.  A 2-D echocardiogram showed an EF of 55% to 60%, with impaired relaxation of LV diastolic dysfunction and moderate concentric LVH.  Troponin max 0.37. Troponin at discharge is 0.24.  HOSPITAL COURSE: A 64 year old female who presented with an elevation in her troponin, felt to have non-ST elevation myocardial infarction. For further details, please refer to the H and P.   1. Non-ST elevation myocardial infarction. The patient presented with chest pain, radiating to her right arm. Her troponins were elevated. Dr. Fletcher Anon was consulted. The patient underwent a cardiac catheterization which showed a chronically occluded left circumflex and a 60% LAD lesion. Medical management was recommended. Plavix and Imdur were added to her current regimen of aspirin, Coreg and statin as well as ARB.  2. Diabetes. The patient was on Lantus and glipizide.  3. Hypertension. The patient may resume her home medications.  4. Gastroesophageal reflux disease. The patient is on ranitidine.  5. Chronic kidney disease.  Renal consult was appreciated. Received NAC and IV fluids prior to catheterization. Her creatinine remained stable.  6. Tobacco dependence. The patient was counseled extensively on stopping smoking to reduce her chance of further coronary artery disease.   DISCHARGE MEDICATIONS:  1. Lisinopril 40 mg daily.  2. Atorvastatin 40 mg daily.  3. Humalog 5 units b.i.d.  4. Lantus 64 units at bedtime.  5. Magnesium oxide 400 b.i.d.  6. Ranitidine 150 b.i.d.  7. Coreg 12.5 b.i.d.  8. Imdur 30 mg daily.  9. Glipizide 5 mg daily.  10. Nitroglycerin sublingual p.r.n. chest pain.  11. Aspirin 81 mg daily.  12. Plavix 75 mg daily.   DISCHARGE DIET: Low sodium, ADA diet.   DISCHARGE ACTIVITY: As tolerated.   DISCHARGE FOLLOWUP: The patient will follow up in 1 week with Dr. Fletcher Anon and in 1 week with Dr. [name].   TIME SPENT: Approximately 35 minutes.   CONDITION: The patient was medically stable for discharge.   ____________________________ Alin Hutchins P. Benjie Karvonen, MD spm:lb D: 04/29/2014 12:40:04 ET T: 04/29/2014 13:13:02 ET JOB#: [id number]  cc: Lacharles Altschuler P. Benjie Karvonen, MD, <Dictator> Muhammad A. Fletcher Anon, MD Murlean Iba, MD Donell Beers Nathalie Cavendish MD ELECTRONICALLY SIGNED 04/29/2014 13:45

## 2015-03-21 NOTE — H&P (Signed)
PATIENT NAME:  Caitlin Bass, CARKHUFF MR#:  [id number] DATE OF BIRTH:  12-02-1950  DATE OF ADMISSION:  05/09/2014  PRIMARY CARE PHYSICIAN: [facility].   CHIEF COMPLAINT: Syncope.   HISTORY OF PRESENT ILLNESS: This is a 64 year old African American female patient with history of diabetes, hypertension, advanced CAD, and CKD stage III, presents to the hospital after she passed out while eating lunch in a booth at a restaurant. The patient mentions that she felt fine until she went to sit down. Initially she felt a little lightheaded and dizzy, then had some spots in her vision, then her head went back and she passed out. A friend was with her, but she is not sure how long she passed out for. She did not have any chest pain, shortness of breath, nausea, vomiting. Did not fall, did not hit her head. After waking up, the patient felt fine, presently feels back to baseline. Here, the patient has been found to have acute renal failure along with hyperkalemia. EKG shows nothing acute. Troponin normal.    PAST MEDICAL HISTORY:   1.  CAD, status post stents with recent cardiac catheterization 2 weeks back.  2.  Insulin-dependent diabetes mellitus.   3.  Hypertension.  4.  CKD, stage III.  5.  GERD.   6.  Degenerative disk disease.  7.  Arthritis.  8.  Hiatal hernia.  9.  Hyperlipidemia.   PAST SURGICAL HISTORY:  1.  Stent placement.  2.  Neck surgery.   ALLERGIES: HYDROCHLOROTHIAZIDE.   SOCIAL HISTORY: The patient lives at home with a roommate. Smokes 1/2 pack a day. No alcohol. No illicit drugs.   FAMILY HISTORY: Significant for heart disease in her mother.   HOME MEDICATIONS: 1.  Aspirin 81 mg daily.  2.  Atorvastatin 40 mg oral once a day.  3.  Coreg 12.5 mg oral 2 times a day.  4.  Plavix 75 mg daily, but stopped by Dr. Fletcher Anon as outpatient recently.  5.  Glipizide XL 5 mg oral once a day.  6.  Humalog 5 units subcutaneous 2 times a day after meals.  7.  Isosorbide mononitrate 30 mg  daily.  8.  Lantus 64 units once a day at bedtime.  9.  Lisinopril 40 mg once a day.  10.  Magnesium oxide 400 mg oral 2 times a day. 11.  Nitrostat 0.4 sublingual as needed for chest pain.  12.  Triamterene 150 mg oral 2 times a day.   REVIEW OF SYSTEMS:  CONSTITUTIONAL: No fever, fatigue.  EYES: No blurred vision, pain, redness. ENT: No tinnitus, ear pain, hearing loss.  RESPIRATORY: No cough, wheeze, hemoptysis.  CARDIOVASCULAR: No chest pain, orthopnea, edema.  GASTROINTESTINAL: No nausea, vomiting, diarrhea, abdominal pain.  GENITOURINARY: No dysuria, hematuria, frequency.  ENDOCRINE: No polyuria, nocturia, thyroid problems.  HEMATOLOGIC AND LYMPHATIC: No anemia, easy bruising, bleeding.  INTEGUMENTARY: No acne, rash, lesion.  MUSCULOSKELETAL: No back pain or arthritis.  NEUROLOGIC: No focal numbness, weakness, seizure.  PSYCHIATRIC: No anxiety, depression.   PHYSICAL EXAMINATION: VITAL SIGNS: Temperature 98.1, pulse 72, blood pressure initially 97/63, presently 153/95, saturating 97% on room air.  GENERAL: Moderately built [place] American female patient sitting up in bed, seems comfortable, conversational, cooperative with exam.  PSYCHIATRIC: Alert and oriented x 3. Mood and affect appropriate. Judgment intact. HEENT: Atraumatic, normocephalic. Oral mucosa dry and pink. External ears and nose normal. No pallor. No icterus. Pupils bilaterally equal and reactive to light.  NECK: Supple. No thyromegaly or palpable lymph  nodes. Trachea in midline. No carotid bruits or JVD. CARDIOVASCULAR: S1, S2 without any murmurs. Peripheral pulses 2+. No edema.  RESPIRATORY: Normal work of breathing. Clear to auscultation on both sides.  GASTROINTESTINAL: Soft abdomen, nontender. Bowel sounds present. No hepatosplenomegaly palpable.  SKIN: Warm and dry. No petechiae, rash, ulcers.  MUSCULOSKELETAL: No joint swelling, redness in large joints. Normal muscle tone.  NEUROLOGICAL: Motor strength 5/5  in upper and lower extremities. Sensation to fine touch intact all over.  LYMPHATIC: No cervical lymphadenopathy.   LABORATORY, DIAGNOSTIC, AND RADIOLOGICAL DATA: Glucose 186, BUN 34, creatinine 2.17, sodium 134, potassium 6.3, chloride 107. AST, ALT, alkaline phosphatase, bilirubin normal. Troponin less than 0.02.   WBC 6.8, hemoglobin 10.9, platelets of 356.   INR 0.9.   EKG shows normal sinus rhythm with left axis deviation. The patient does have T wave inversions in lateral leads along with poor R wave progression. All these are unchanged from recent EKG from prior admission.   Chest x-ray shows nothing acute.   ASSESSMENT AND PLAN: 1.  Syncope in a patient with acute renal failure and hyperkalemia: Most likely secondary to dehydration, although this could be arrhythmia. The patient did have prodromal symptoms, making arrhythmia unlikely, but the patient will be admitted onto a tele floor. Will rehydrate her aggressively. Treat hyperkalemia with insulin, D50, Kayexalate, and calcium chloride. The patient does not have any EKG changes secondary to the hyperkalemia. She did have a recent echocardiogram and will not be repeated. Troponin normal. Check 2 more sets of enzymes to rule out acute coronary syndrome. 2.  Acute renal failure with chronic kidney disease: Likely secondary from dehydration and hypotension, but could be secondary to recent contrast load from the cardiac cath. Will put her on IV fluids and monitor. Nephrology consult if this does not get better.  3.  Coronary artery disease: Stable.  4.  Hypertension: Continue medications, except lisinopril.  5.  Insulin-dependent diabetes mellitus: Continue home dose of insulin and sliding scale insulin.  6.  Tobacco abuse: The patient has been counseled to quit for greater than 3 minutes. She understands, but mentions that she will not quit smoking.  7.  Hemoccult-positive stool: The patient complained of some constipation. She did have  her stool checked in the Emergency Room for blood, and this was positive. Her hemoglobin looks stable compared to recent blood work. Her hemoglobin needs to be monitored. She will need outpatient work-up with GI, but if her hemoglobin drops significantly, she will need an inpatient GI consult. The patient continues to be on aspirin.  8.  Deep vein thrombosis prophylaxis with sequential compression devices. No heparin or Lovenox secondary to concern for possible gastrointestinal bleed.   CODE STATUS: Full code.   TIME SPENT: Today on this case was 45 minutes.    ____________________________ Leia Alf Dannika Hilgeman, MD srs:jcm D: 05/09/2014 16:18:53 ET T: 05/09/2014 16:37:15 ET JOB#: [id number]  cc: Alveta Heimlich R. Gerlean Cid, MD, <Dictator> [facility] A. Fletcher Anon, MD Neita Carp MD ELECTRONICALLY SIGNED 05/16/2014 11:40

## 2015-04-09 ENCOUNTER — Other Ambulatory Visit: Payer: Self-pay | Admitting: Family Medicine

## 2015-04-09 DIAGNOSIS — Z1239 Encounter for other screening for malignant neoplasm of breast: Secondary | ICD-10-CM

## 2015-04-20 DIAGNOSIS — M5489 Other dorsalgia: Secondary | ICD-10-CM | POA: Insufficient documentation

## 2015-04-21 ENCOUNTER — Encounter: Payer: Self-pay | Admitting: Cardiovascular Disease

## 2015-04-21 ENCOUNTER — Ambulatory Visit (INDEPENDENT_AMBULATORY_CARE_PROVIDER_SITE_OTHER): Payer: Medicaid Other | Admitting: Cardiovascular Disease

## 2015-04-21 VITALS — BP 136/78 | HR 76 | Ht 64.0 in | Wt 165.2 lb

## 2015-04-21 DIAGNOSIS — E785 Hyperlipidemia, unspecified: Secondary | ICD-10-CM | POA: Diagnosis not present

## 2015-04-21 DIAGNOSIS — I158 Other secondary hypertension: Secondary | ICD-10-CM

## 2015-04-21 DIAGNOSIS — I25119 Atherosclerotic heart disease of native coronary artery with unspecified angina pectoris: Secondary | ICD-10-CM | POA: Diagnosis not present

## 2015-04-21 MED ORDER — CLOPIDOGREL BISULFATE 75 MG PO TABS: 75.0000 mg | ORAL_TABLET | Freq: Every day | ORAL | Status: DC

## 2015-04-21 NOTE — Assessment & Plan Note (Signed)
Blood pressure is reasonably controlled on current medications. 

## 2015-04-21 NOTE — Progress Notes (Signed)
HPI  64 y/o female who is here today for a followup visit regarding coronary artery disease.  She presented to [facility] with bilat arm and right chest pain in May 2015 and r/i for NSTEMI. Echo showed nl LV fxn. She underwent cath revealing an occluded LCX with L-L collats and otherwise nonobstructive CAD. She was medically managed and placed on asa, plavix, bb, acei, long-acting nitrate, and statin therapy.  She has quit smoking. She was not able to afford plavix . She has been doing well overall with no reported chest pain, shortness of breath or palpitations. She applied for disability and currently has Medicaid. She has been taking her medications regularly. She reports intolerance to aspirin due to upset stomach. She uses naproxen for pain management which I advised her against. She reports no relief with acetaminophen.  Allergies  Allergen Reactions  . Aspirin   . Hydrochlorothiazide      Current Outpatient Prescriptions on File Prior to Visit  Medication Sig Dispense Refill  . atorvastatin (LIPITOR) 40 MG tablet Take 40 mg by mouth daily.    . carvedilol (COREG) 12.5 MG tablet Take 12.5 mg by mouth 2 (two) times daily with a meal.    . glipiZIDE (GLUCOTROL) 5 MG tablet Take 5 mg by mouth daily before breakfast.    . insulin glargine (LANTUS) 100 UNIT/ML injection Inject 35-38 Units into the skin 2 (two) times daily.     . Insulin Lispro, Human, (HUMALOG KWIKPEN [place]) Inject 8 Units into the skin 2 (two) times daily.     . isosorbide mononitrate (IMDUR) 30 MG 24 hr tablet Take 30 mg by mouth daily.    . magnesium oxide (MAG-OX) 400 MG tablet Take 400 mg by mouth 2 (two) times daily.    . nitroGLYCERIN (NITROSTAT) 0.4 MG SL tablet Place 1 tablet (0.4 mg total) under the tongue every 5 (five) minutes as needed for chest pain. 25 tablet 1  . ranitidine (ZANTAC) 150 MG tablet Take 150 mg by mouth 2 (two) times daily.     No current facility-administered medications on file prior to visit.      Past Medical History  Diagnosis Date  . Coronary artery disease     a. 03/2014 NSTEMI/Cath: LM nl, LAD 66m, 60d, D1 60, D2 nl, d3 40, LCX 100 w/ L->L collats, RCA non dom, min irregs, EF 55%.  . Diabetes mellitus without complication   . Hypertension   . CKD (chronic kidney disease), stage IV   . GERD (gastroesophageal reflux disease)   . DDD (degenerative disc disease)     a. s/p upper back surgery spring 2015.  Marland Kitchen Arthritis   . Hiatal hernia   . Hyperlipidemia   . Syncope and collapse     a. Early 03/2014->did not seek medical attn.     Past Surgical History  Procedure Laterality Date  . Neck surgery      Degenerative Disk Disease and removal of a spinal cyst  . Cardiac catheterization  06/2005    [facility]  . Cardiac catheterization  04/2014    [facility]  . Tubal ligation       Family History  Problem Relation Age of Onset  . Heart disease Mother   . Heart attack Mother   . Hypertension Mother   . Hyperlipidemia Mother      History   Social History  . Marital Status: Widowed    Spouse Name: N/A  . Number of Children: N/A  . Years  of Education: N/A   Occupational History  . Not on file.   Social History Main Topics  . Smoking status: Former Smoker -- 0.50 packs/day for 40 years    Types: Cigarettes  . Smokeless tobacco: Not on file  . Alcohol Use: No  . Drug Use: No  . Sexual Activity: Not on file   Other Topics Concern  . Not on file   Social History Narrative      PHYSICAL EXAM   BP 136/78 mmHg  Pulse 76  Ht 5\' 4"  (1.626 m)  Wt 165 lb 4 oz (74.957 kg)  BMI 28.35 kg/m2 Constitutional: She is oriented to person, place, and time. She appears well-developed and well-nourished. No distress.  HENT: No nasal discharge.  Head: Normocephalic and atraumatic.  Eyes: Pupils are equal and round. No discharge.  Neck: Normal range of motion. Neck supple. No JVD present. No thyromegaly present.  Cardiovascular: Normal rate, regular rhythm, normal heart  sounds. Exam reveals no gallop and no friction rub. No murmur heard.  Pulmonary/Chest: Effort normal and breath sounds normal. No stridor. No respiratory distress. She has no wheezes. She has no rales. She exhibits no tenderness.  Abdominal: Soft. Bowel sounds are normal. She exhibits no distension. There is no tenderness. There is no rebound and no guarding.  Musculoskeletal: Normal range of motion. She exhibits no edema and no tenderness.  Neurological: She is alert and oriented to person, place, and time. Coordination normal.  Skin: Skin is warm and dry. No rash noted. She is not diaphoretic. No erythema. No pallor.  Psychiatric: She has a normal mood and affect. Her behavior is normal. Judgment and thought content normal.     EKG: Sinus  Rhythm  -Left axis -anterior fascicular block.   -  T-abnormality  - Anterolateral ischemia.   ABNORMAL     ASSESSMENT AND PLAN

## 2015-04-21 NOTE — Assessment & Plan Note (Signed)
She is doing well with no symptoms suggestive of angina. I recommend continuing medical therapy. She reports intolerance to aspirin and thus I decided to resume Plavix 75 mg once daily. I advised her to avoid using NSAIDs as much as possible.

## 2015-04-21 NOTE — Assessment & Plan Note (Signed)
Continue treatment with atorvastatin with a target LDL of less than 70. She reports having labs done with her primary care physician.

## 2015-04-21 NOTE — Patient Instructions (Signed)
Medication Instructions:  Your physician has recommended you make the following change in your medication:  START taking Plavix 75mg  once a day    Labwork: None  Testing/Procedures: None  Follow-Up: Your physician wants you to follow-up in: [place] with Dr. Fletcher Anon. You will receive a reminder letter in the mail two months in advance. If you don't receive a letter, please call our office to schedule the follow-up appointment.   Any Other Special Instructions Will Be Listed Below (If Applicable).

## 2015-07-06 ENCOUNTER — Inpatient Hospital Stay: Payer: Medicaid Other | Attending: Internal Medicine | Admitting: Internal Medicine

## 2015-07-06 VITALS — BP 159/88 | HR 84 | Temp 98.0°F | Resp 18 | Ht 64.0 in | Wt 161.4 lb

## 2015-07-06 DIAGNOSIS — I2510 Atherosclerotic heart disease of native coronary artery without angina pectoris: Secondary | ICD-10-CM | POA: Insufficient documentation

## 2015-07-06 DIAGNOSIS — D8989 Other specified disorders involving the immune mechanism, not elsewhere classified: Secondary | ICD-10-CM

## 2015-07-06 DIAGNOSIS — Z87891 Personal history of nicotine dependence: Secondary | ICD-10-CM | POA: Diagnosis not present

## 2015-07-06 DIAGNOSIS — M1990 Unspecified osteoarthritis, unspecified site: Secondary | ICD-10-CM | POA: Diagnosis not present

## 2015-07-06 DIAGNOSIS — E1122 Type 2 diabetes mellitus with diabetic chronic kidney disease: Secondary | ICD-10-CM | POA: Insufficient documentation

## 2015-07-06 DIAGNOSIS — I129 Hypertensive chronic kidney disease with stage 1 through stage 4 chronic kidney disease, or unspecified chronic kidney disease: Secondary | ICD-10-CM | POA: Diagnosis not present

## 2015-07-06 DIAGNOSIS — D808 Other immunodeficiencies with predominantly antibody defects: Secondary | ICD-10-CM | POA: Diagnosis not present

## 2015-07-06 DIAGNOSIS — N184 Chronic kidney disease, stage 4 (severe): Secondary | ICD-10-CM | POA: Diagnosis not present

## 2015-07-06 DIAGNOSIS — I252 Old myocardial infarction: Secondary | ICD-10-CM | POA: Insufficient documentation

## 2015-07-06 NOTE — Progress Notes (Signed)
Patient is referred here by Dr. Johnney Ou for elevated Kappa and Lamda light Chains. She states that she has been having trouble with itching for a long time. She states that she has recently seen dermatology and was prescribed a cream for the itching. She states that it has gotten better and she is scheduled to follow-up with dermatologist soon for lab results.

## 2015-07-07 DIAGNOSIS — R8290 Unspecified abnormal findings in urine: Secondary | ICD-10-CM | POA: Insufficient documentation

## 2015-07-14 NOTE — Progress Notes (Signed)
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  [Labs done on 06/04/15 - SPEP negative for M-spike, serum kappa light chains 35.9 (ref range 3.30-19.40), lambda light chains 17.86 (ref range 5.71-26.30), kappa/lambda ratio 1.99 (ref range 0.26-1.65), creatinine 1.61, BUN 20, calcium 9.9, HCV Ab negative, hemoglobin 12.9, WBC 7.9, platelets 316, complement normal (C3 is 120, C4 is 37), albumin 4.3]  HISTORY OF PRESENT ILLNESS:  Caitlin Bass is a 64 year old female with past medical history significant for diabetes mellitus, hypertension, chronic kidney disease stage IV, GERD, coronary artery disease status post NSTEMI and stent, chronic back pain status post neck surgery in 2015, degenerative disc disease, arthritis, hyperlipidemia, hiatal hernia, history of syncope in 2015, tubal ligation, follows with [facility] nephrology and recently had labs done on July 17 which reported elevated serum kappa/lambda ratio of 1.99, mildly elevated kappa light chains of 35.9. Otherwise SPEP was negative for M spike, hemoglobin was normal at 12.9, calcium normal at 9.9 and creatinine elevated at 1.61. Patient states that she has symptoms of chronic arthritis which is mostly unchanged, denies new or progressive bone pains. She denies any fevers or night sweats. She denies feeling any enlarged lymph node masses in the past. Appetite is good, denies unintentional weight loss.  REVIEW OF SYSTEMS:   ROS CONSTITUTIONAL: As in HPI above. No chills, fever or sweats.    ENT:  No headache, dizziness or epistaxis. No ear or jaw pain. No sinus symptoms. RESPIRATORY:  Currently denies cough, shortness of breath or  wheezing. No hemoptysis. CARDIAC:  No palpitations. Has occasional angina. No orthopnea, PND. GI: Intermittent nausea, indigestion. Intermittent constipation, history of hemorrhoids with occasional scanty blood in stools. Denies major abdominal or pelvic pain. No diarrhea.   GU:  No dysuria or hematuria.  SKIN: No rashes or pruritus. HEMATOLOGIC: denies bleeding symptoms MUSCULOSKELETAL:  Chronic arthritis symptoms in lower extremities, elbows, shoulders and hands.  EXTREMITY:  No new swelling or pain.  NEURO:  Chronic memory changes, intermittent dizziness, occasional mild confusion. Denies any new focal weakness. Denies numbness or tingling of extremities.  No seizures.   ENDOCRINE:  No polyuria or polydipsia.   PAST MEDICAL HISTORY: Reviewed. Past Medical History  Diagnosis Date  . Coronary artery disease     a. 03/2014 NSTEMI/Cath: LM nl, LAD 45m, 60d, D1 60, D2 nl, d3 40, LCX 100 w/ L->L collats, RCA non dom, min irregs, EF 55%.  . Diabetes mellitus without complication   . Hypertension   . CKD (chronic kidney disease), stage IV   . GERD (gastroesophageal reflux disease)   . DDD (degenerative disc disease)     a. s/p upper back surgery spring 2015.  Marland Kitchen Arthritis   . Hiatal hernia   . Hyperlipidemia   . Syncope and collapse     a. Early 03/2014->did not seek medical attn.    PAST SURGICAL HISTORY: Reviewed. Past Surgical History  Procedure Laterality Date  . Neck surgery      Degenerative Disk Disease and removal of a spinal cyst  . Cardiac catheterization  06/2005    [facility]  . Cardiac catheterization  04/2014    [facility]  . Tubal ligation      FAMILY HISTORY: Reviewed. Family History  Problem Relation Age of Onset  . Heart disease Mother   . Heart attack Mother   . Hypertension Mother   . Hyperlipidemia Mother     SOCIAL HISTORY: Reviewed. Social History  Substance Use Topics  . Smoking status: Former Smoker -- 0.50 packs/day for 40 years    Types: Cigarettes  .  Smokeless tobacco: Not on file  . Alcohol Use: No    Allergies  Allergen Reactions  . Aspirin   . Hydrochlorothiazide     Current Outpatient Prescriptions  Medication Sig Dispense Refill  . amLODipine (NORVASC) 2.5 MG tablet Take 2.5 mg by mouth daily.    Marland Kitchen atorvastatin (LIPITOR) 40 MG tablet Take 40 mg by mouth daily.    . carvedilol (COREG) 12.5 MG tablet Take 12.5 mg by mouth 2 (two) times daily with a meal.    . cholecalciferol (VITAMIN D) 1000 UNITS tablet Take 1,000 Units by mouth 2 (two) times daily.    . clopidogrel (PLAVIX) 75 MG tablet Take 1 tablet (75 mg total) by mouth daily. 30 tablet 11  . cyclobenzaprine (FLEXERIL) 5 MG tablet Take 5 mg by mouth as needed for muscle spasms.    Marland Kitchen glipiZIDE (GLUCOTROL) 5 MG tablet Take 5 mg by mouth daily before breakfast.    . insulin glargine (LANTUS) 100 UNIT/ML injection Inject 35-38 Units into the skin 2 (two) times daily.     . Insulin Lispro, Human, (HUMALOG KWIKPEN ) Inject 8 Units into the skin 2 (two) times daily.     . isosorbide mononitrate (IMDUR) 30 MG 24 hr tablet Take 30 mg by mouth daily.    . magnesium oxide (MAG-OX) 400 MG tablet Take 400 mg by mouth 2 (two) times daily.    . naproxen (NAPROSYN) 500 MG tablet Take 250 mg by mouth 2 (two) times daily with a meal.    . nitroGLYCERIN (NITROSTAT) 0.4 MG SL tablet Place 1 tablet (0.4 mg total) under the tongue every 5 (five) minutes as needed for chest pain. 25 tablet 1  . ranitidine (ZANTAC) 150 MG tablet Take 150 mg by mouth 2 (two) times daily.     No current facility-administered medications for this visit.    PHYSICAL EXAM: Filed Vitals:   07/06/15 1448  BP: 159/88  Pulse: 84  Temp: 98 F (36.7 C)  Resp: 18     Body mass index is 27.69 kg/(m^2).    GENERAL: Patient is alert and oriented and in no acute distress. There is no icterus. HEENT: EOMs intact. Oral exam negative for thrush or lesions. No cervical lymphadenopathy. CVS: S1S2, regular LUNGS:  Bilaterally clear to auscultation, no rhonchi. ABDOMEN: Soft, nontender. No hepatosplenomegaly clinically.  NEURO: grossly nonfocal, cranial nerves are intact.  EXTREMITIES: No pedal edema. SKIN: No major bruising or rash MUSCULOSKELETAL: No obvious joint redness or swelling LYMPHATICS: No palpable adenopathy in axillary or inguinal areas.   LAB RESULTS: 06/04/15 - SPEP negative for M-spike, serum kappa light chains 35.9 (ref range 3.30-19.40), lambda light chains 17.86 (ref range 5.71-26.30), kappa/lambda ratio 1.99 (ref range 0.26-1.65), creatinine 1.61, BUN 20, calcium 9.9, HCV Ab negative, hemoglobin 12.9, WBC 7.9, platelets 316, complement normal (C3 is 120, C4 is 37), albumin 4.3.     Component Value Date/Time   NA 139 10/21/2014 1332   K 4.4 10/21/2014 1332   CL 108* 10/21/2014 1332   CO2 24 10/21/2014 1332   GLUCOSE 103* 10/21/2014 1332   BUN 21* 10/21/2014 1332   CREATININE 1.45* 10/21/2014 1332  CALCIUM 9.1 10/21/2014 1332   PROT 7.5 10/21/2014 1332   ALBUMIN 3.6 10/21/2014 1332   AST 15 10/21/2014 1332   ALT 27 10/21/2014 1332   ALKPHOS 94 10/21/2014 1332   BILITOT 0.2 10/21/2014 1332   GFRNONAA 39* 10/21/2014 1332   GFRNONAA 27* 05/10/2014 0031   GFRAA 47* 10/21/2014 1332   GFRAA 32* 05/10/2014 0031    Lab Results  Component Value Date   WBC 7.8 05/10/2014   NEUTROABS 4.4 05/10/2014   HGB 9.7* 05/10/2014   HCT 30.1* 05/10/2014   MCV 87 05/10/2014   PLT 337 05/10/2014     ASSESSMENT / PLAN:   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 Have  reviewed records and labs sent benefiting physician and discussed with patient. Have explained that kappa light chains are mildly elevated which makes to kappa lambda ratio slightly high at 1.99 and this could be due to decreased light chain clearance by the kidney (given that ratio is <3, and she does not have anemia/M-spike/hypercalcemia/new bone pains). Other possibility is monoclonal gammopathy of unknown significance. Have recommended that we get skeletal x-ray survey done to rule out occult lytic bone lesions, she wants to hold off at this time. Plan is continued surveillance, we will repeat MGUS indices q16 weeks and see her back at 32 weeks. If there is progression of light chains or other evidence to suggest plasma cell dyscrasia like myeloma, she will need further workup at that time.     In between visits, the patient has been advised to call or come to the ER in case of fevers, night sweats, unintentional weight loss, lymph node masses felt on self-exam, new bone pains, acute sickness or other new symptoms. Patient is agreeable to this plan.   Leia Alf, MD   07/14/2015 9:05 AM

## 2015-08-04 ENCOUNTER — Ambulatory Visit: Payer: Medicaid Other

## 2015-08-06 ENCOUNTER — Ambulatory Visit
Admission: RE | Admit: 2015-08-06 | Discharge: 2015-08-06 | Disposition: A | Discharge status: Home / Self Care | Payer: Medicaid Other | Source: Ambulatory Visit | Attending: Family Medicine | Admitting: Family Medicine

## 2015-08-06 DIAGNOSIS — Z1231 Encounter for screening mammogram for malignant neoplasm of breast: Secondary | ICD-10-CM | POA: Diagnosis present

## 2015-08-06 DIAGNOSIS — Z1239 Encounter for other screening for malignant neoplasm of breast: Secondary | ICD-10-CM

## 2015-08-14 ENCOUNTER — Encounter: Payer: Self-pay | Admitting: Emergency Medicine

## 2015-08-14 ENCOUNTER — Emergency Department
Admission: EM | Admit: 2015-08-14 | Discharge: 2015-08-14 | Disposition: A | Discharge status: Home / Self Care | Payer: Medicaid Other | Attending: Emergency Medicine | Admitting: Emergency Medicine

## 2015-08-14 DIAGNOSIS — H578 Other specified disorders of eye and adnexa: Secondary | ICD-10-CM | POA: Diagnosis present

## 2015-08-14 DIAGNOSIS — H109 Unspecified conjunctivitis: Secondary | ICD-10-CM | POA: Diagnosis not present

## 2015-08-14 DIAGNOSIS — Z791 Long term (current) use of non-steroidal anti-inflammatories (NSAID): Secondary | ICD-10-CM | POA: Insufficient documentation

## 2015-08-14 DIAGNOSIS — Z794 Long term (current) use of insulin: Secondary | ICD-10-CM | POA: Diagnosis not present

## 2015-08-14 DIAGNOSIS — E119 Type 2 diabetes mellitus without complications: Secondary | ICD-10-CM | POA: Diagnosis not present

## 2015-08-14 DIAGNOSIS — I129 Hypertensive chronic kidney disease with stage 1 through stage 4 chronic kidney disease, or unspecified chronic kidney disease: Secondary | ICD-10-CM | POA: Insufficient documentation

## 2015-08-14 DIAGNOSIS — Z79899 Other long term (current) drug therapy: Secondary | ICD-10-CM | POA: Diagnosis not present

## 2015-08-14 DIAGNOSIS — Z720 Tobacco use: Secondary | ICD-10-CM | POA: Diagnosis not present

## 2015-08-14 DIAGNOSIS — Z7902 Long term (current) use of antithrombotics/antiplatelets: Secondary | ICD-10-CM | POA: Diagnosis not present

## 2015-08-14 DIAGNOSIS — N184 Chronic kidney disease, stage 4 (severe): Secondary | ICD-10-CM | POA: Diagnosis not present

## 2015-08-14 MED ORDER — CIPROFLOXACIN HCL 0.3 % OP SOLN: 1.0000 [drp] | OPHTHALMIC | Status: AC

## 2015-08-14 NOTE — ED Notes (Signed)
Pt states she has eye pain and has been on medication for it states she had STDs in her eye and was given medication to threat it but feels like it still hasnt gone away

## 2015-08-14 NOTE — Discharge Instructions (Signed)
Bacterial Conjunctivitis °Bacterial conjunctivitis (commonly called pink eye) is redness, soreness, or puffiness (inflammation) of the white part of your eye. It is caused by a germ called bacteria. These germs can easily spread from person to person (contagious). Your eye often will become red or pink. Your eye may also become irritated, watery, or have a thick discharge.  °HOME CARE  °· Apply a cool, clean washcloth over closed eyelids. Do this for 10-20 minutes, 3-4 times a day while you have pain. °· Gently wipe away any fluid coming from the eye with a warm, wet washcloth or cotton ball. °· Wash your hands often with soap and water. Use paper towels to dry your hands. °· Do not share towels or washcloths. °· Change or wash your pillowcase every day. °· Do not use eye makeup until the infection is gone. °· Do not use machines or drive if your vision is blurry. °· Stop using contact lenses. Do not use them again until your doctor says it is okay. °· Do not touch the tip of the eye drop bottle or medicine tube with your fingers when you put medicine on the eye. °GET HELP RIGHT AWAY IF:  °· Your eye is not better after 3 days of starting your medicine. °· You have a yellowish fluid coming out of the eye. °· You have more pain in the eye. °· Your eye redness is spreading. °· Your vision becomes blurry. °· You have a fever or lasting symptoms for more than 2-3 days. °· You have a fever and your symptoms suddenly get worse. °· You have pain in the face. °· Your face gets red or puffy (swollen). °MAKE SURE YOU:  °· Understand these instructions. °· Will watch this condition. °· Will get help right away if you are not doing well or get worse. °Document Released: 08/23/2008 Document Revised: 10/31/2012 Document Reviewed: 07/20/2012 °ExitCare® Patient Information ©2015 [employer]. This information is not intended to replace advice given to you by your health care provider. Make sure you discuss any questions you have  with your health care provider. ° °

## 2015-08-14 NOTE — ED Notes (Signed)
Patient presents to the ED with swelling, redness, and pain to her left eye.  Patient states she just finished taking a course of prednisone yesterday.  Patient states moving her eye increases pain.  Patient reports clear drainage from left eye since last night.

## 2015-08-14 NOTE — ED Provider Notes (Signed)
[facility] Emergency Department Provider Note  ____________________________________________  Time seen: Approximately 3:15 PM  I have reviewed the triage vital signs and the nursing notes.   HISTORY  Chief Complaint Eye Problem    HPI Caitlin Bass is a 64 y.o. female presents to emergency room with complaint of swelling, redness, and pain to her left eye. She states that she has been diagnosed with chronic conjunctivitis and bilateral eyes. Only having symptoms and the left eye today. States that it is increased "redness and drainage". The drainage is described as thick and green. Does wear glasses but does not wear contacts     Past Medical History  Diagnosis Date  . Coronary artery disease     a. 03/2014 NSTEMI/Cath: LM nl, LAD 4m, 60d, D1 60, D2 nl, d3 40, LCX 100 w/ L->L collats, RCA non dom, min irregs, EF 55%.  . Diabetes mellitus without complication   . Hypertension   . CKD (chronic kidney disease), stage IV   . GERD (gastroesophageal reflux disease)   . DDD (degenerative disc disease)     a. s/p upper back surgery spring 2015.  Marland Kitchen Arthritis   . Hiatal hernia   . Hyperlipidemia   . Syncope and collapse     a. Early 03/2014->did not seek medical attn.    Patient Active Problem List   Diagnosis Date Noted  . Hyperlipidemia   . Coronary artery disease   . Diabetes mellitus without complication   . Hypertension   . CKD (chronic kidney disease), stage IV   . GERD (gastroesophageal reflux disease)   . DDD (degenerative disc disease)     Past Surgical History  Procedure Laterality Date  . Neck surgery      Degenerative Disk Disease and removal of a spinal cyst  . Cardiac catheterization  06/2005    [facility]  . Cardiac catheterization  04/2014    [facility]  . Tubal ligation    . Breast cyst aspiration Right 2005    NEG    Current Outpatient Rx  Name  Route  Sig  Dispense  Refill  . amLODipine (NORVASC) 2.5 MG tablet   Oral   Take 2.5  mg by mouth daily.         Marland Kitchen atorvastatin (LIPITOR) 40 MG tablet   Oral   Take 40 mg by mouth daily.         . carvedilol (COREG) 12.5 MG tablet   Oral   Take 12.5 mg by mouth 2 (two) times daily with a meal.         . cholecalciferol (VITAMIN D) 1000 UNITS tablet   Oral   Take 1,000 Units by mouth 2 (two) times daily.         . ciprofloxacin (CILOXAN) 0.3 % ophthalmic solution   Left Eye   Place 1 drop into the left eye every 2 (two) hours. Administer 1 drop, every 2 hours, while awake, for 2 days. Then 1 drop, every 4 hours, while awake, for the next 5 days.   5 mL   0   . clopidogrel (PLAVIX) 75 MG tablet   Oral   Take 1 tablet (75 mg total) by mouth daily.   30 tablet   11   . cyclobenzaprine (FLEXERIL) 5 MG tablet   Oral   Take 5 mg by mouth as needed for muscle spasms.         Marland Kitchen glipiZIDE (GLUCOTROL) 5 MG tablet   Oral  Take 5 mg by mouth daily before breakfast.         . insulin glargine (LANTUS) 100 UNIT/ML injection   Subcutaneous   Inject 35-38 Units into the skin 2 (two) times daily.          . Insulin Lispro, Human, (HUMALOG KWIKPEN [place])   Subcutaneous   Inject 8 Units into the skin 2 (two) times daily.          . isosorbide mononitrate (IMDUR) 30 MG 24 hr tablet   Oral   Take 30 mg by mouth daily.         . magnesium oxide (MAG-OX) 400 MG tablet   Oral   Take 400 mg by mouth 2 (two) times daily.         . naproxen (NAPROSYN) 500 MG tablet   Oral   Take 250 mg by mouth 2 (two) times daily with a meal.         . nitroGLYCERIN (NITROSTAT) 0.4 MG SL tablet   Sublingual   Place 1 tablet (0.4 mg total) under the tongue every 5 (five) minutes as needed for chest pain.   25 tablet   1   . ranitidine (ZANTAC) 150 MG tablet   Oral   Take 150 mg by mouth 2 (two) times daily.           Allergies Aspirin and Hydrochlorothiazide  Family History  Problem Relation Age of Onset  . Heart disease Mother   . Heart attack Mother    . Hypertension Mother   . Hyperlipidemia Mother   . Breast cancer Sister     Social History Social History  Substance Use Topics  . Smoking status: Current Every Day Smoker -- 0.20 packs/day for 40 years    Types: Cigarettes  . Smokeless tobacco: None  . Alcohol Use: No    Review of Systems Constitutional: No fever/chills Eyes: No visual changes. Conjunctival redness and purulent discharge. ENT: No sore throat. Cardiovascular: Denies chest pain. Respiratory: Denies shortness of breath. Gastrointestinal: No abdominal pain.  No nausea, no vomiting.  No diarrhea.  No constipation. Genitourinary: Negative for dysuria. Musculoskeletal: Negative for back pain. Skin: Negative for rash. Neurological: Negative for headaches, focal weakness or numbness.  10-point ROS otherwise negative.  ____________________________________________   PHYSICAL EXAM:  VITAL SIGNS: ED Triage Vitals  Enc Vitals Group     BP 08/14/15 1413 164/62 mmHg     Pulse Rate 08/14/15 1413 84     Resp 08/14/15 1413 16     Temp 08/14/15 1413 99.3 F (37.4 C)     Temp Source 08/14/15 1413 Oral     SpO2 08/14/15 1413 96 %     Weight 08/14/15 1413 165 lb (74.844 kg)     Height 08/14/15 1413 5\' 4"  (1.626 m)     Head Cir --      Peak Flow --      Pain Score 08/14/15 1416 5     Pain Loc --      Pain Edu? --      Excl. in [place]? --     Constitutional: Alert and oriented. Well appearing and in no acute distress. Eyes: Injected Conjunctiva on the left side right side is normal. PERRL. EOMI. purulent discharge noted on upper and lower eyelid of the left eye. Head: Atraumatic. Nose: No congestion/rhinnorhea. Mouth/Throat: Mucous membranes are moist.  Oropharynx non-erythematous. Neck: No stridor. No cervical spine tenderness to palpation. Cardiovascular: Normal rate, regular rhythm. Grossly normal  heart sounds.  Good peripheral circulation. Respiratory: Normal respiratory effort.  No retractions. Lungs  CTAB. Gastrointestinal: Soft and nontender. No distention. No abdominal bruits. No CVA tenderness. Musculoskeletal: No lower extremity tenderness nor edema.  No joint effusions. Neurologic:  Normal speech and language. No gross focal neurologic deficits are appreciated. No gait instability. Skin:  Skin is warm, dry and intact. No rash noted. Psychiatric: Mood and affect are normal. Speech and behavior are normal.  ____________________________________________   LABS (all labs ordered are listed, but only abnormal results are displayed)  Labs Reviewed - No data to display ____________________________________________  EKG   ____________________________________________  RADIOLOGY   ____________________________________________   PROCEDURES  Procedure(s) performed: None    ____________________________________________   INITIAL IMPRESSION / ASSESSMENT AND PLAN / ED COURSE  Pertinent labs & imaging results that were available during my care of the patient were reviewed by me and considered in my medical decision making (see chart for details).  Patient has a long history of recurrent conjunctivitis. Recent symptoms started yesterday and has increased in severity. She reports "redness" of left eye as well as "thick green discharge". Exam is consistent with bacterial conjunctivitis. Will treat with Cipro eyedrops. Patient given injections to follow-up with ophthalmology if symptoms do not resolve after antibiotics use. Patient verbalizes understanding and states compliance ____________________________________________   FINAL CLINICAL IMPRESSION(S) / ED DIAGNOSES  Final diagnoses:  Bacterial conjunctivitis      Darletta Moll, PA-C 08/14/15 [place], MD 08/14/15 1601

## 2015-09-03 ENCOUNTER — Other Ambulatory Visit: Payer: Medicaid Other

## 2015-09-10 ENCOUNTER — Telehealth: Payer: Self-pay

## 2015-09-10 ENCOUNTER — Encounter
Admission: RE | Admit: 2015-09-10 | Discharge: 2015-09-10 | Disposition: A | Discharge status: Home / Self Care | Payer: Medicaid Other | Source: Ambulatory Visit | Attending: Obstetrics and Gynecology | Admitting: Obstetrics and Gynecology

## 2015-09-10 ENCOUNTER — Telehealth: Payer: Self-pay | Admitting: *Deleted

## 2015-09-10 DIAGNOSIS — Z01818 Encounter for other preprocedural examination: Secondary | ICD-10-CM | POA: Diagnosis not present

## 2015-09-10 DIAGNOSIS — N858 Other specified noninflammatory disorders of uterus: Secondary | ICD-10-CM | POA: Diagnosis not present

## 2015-09-10 HISTORY — DX: Anemia, unspecified: D64.9

## 2015-09-10 HISTORY — DX: Diverticulosis of intestine, part unspecified, without perforation or abscess without bleeding: K57.90

## 2015-09-10 HISTORY — DX: Anxiety disorder, unspecified: F41.9

## 2015-09-10 HISTORY — DX: Acute myocardial infarction, unspecified: I21.9

## 2015-09-10 HISTORY — DX: Fibromyalgia: M79.7

## 2015-09-10 LAB — CBC
HCT: 38.6 % (ref 35.0–47.0)
HEMOGLOBIN: 12.3 g/dL (ref 12.0–16.0)
MCH: 27.8 pg (ref 26.0–34.0)
MCHC: 31.9 g/dL — ABNORMAL LOW (ref 32.0–36.0)
MCV: 87.2 fL (ref 80.0–100.0)
Platelets: 300 10*3/uL (ref 150–440)
RBC: 4.42 MIL/uL (ref 3.80–5.20)
RDW: 15.8 % — ABNORMAL HIGH (ref 11.5–14.5)
WBC: 7 10*3/uL (ref 3.6–11.0)

## 2015-09-10 LAB — BASIC METABOLIC PANEL
ANION GAP: 8 (ref 5–15)
BUN: 38 mg/dL — ABNORMAL HIGH (ref 6–20)
CALCIUM: 9.9 mg/dL (ref 8.9–10.3)
CO2: 22 mmol/L (ref 22–32)
Chloride: 110 mmol/L (ref 101–111)
Creatinine, Ser: 1.91 mg/dL — ABNORMAL HIGH (ref 0.44–1.00)
GFR calc non Af Amer: 27 mL/min — ABNORMAL LOW (ref >60)
GFR, EST AFRICAN AMERICAN: 31 mL/min — AB (ref >60)
Glucose, Bld: 61 mg/dL — ABNORMAL LOW (ref 65–99)
Potassium: 5 mmol/L (ref 3.5–5.1)
Sodium: 140 mmol/L (ref 135–145)

## 2015-09-10 LAB — TYPE AND SCREEN
ABO/RH(D): O POS
ANTIBODY SCREEN: NEGATIVE

## 2015-09-10 LAB — ABO/RH: ABO/RH(D): O POS

## 2015-09-10 NOTE — Telephone Encounter (Signed)
Forward to Chris Berge, NP 

## 2015-09-10 NOTE — Telephone Encounter (Signed)
They needs this done asap due to the medications being held a big longer than what they though.  Request for surgical clearance:  1. What type of surgery is being performed? Hysterectomy with a Fractional D&C myosure  2. When is this surgery scheduled? 09/18/15  3. Are there any medications that need to be held prior to surgery and how long? Plaxiv and Aspirin not sure how long needs to be off, thinks its 7 days before   4. Name of physician performing surgery? Dr Marcello Moores Schermerhorn   5. What is your office phone and fax number? Fax number: [phone]  6.

## 2015-09-10 NOTE — Telephone Encounter (Signed)
Nurse with [facility] Dr. Ouida Sills states pt is having surgery on 10/21, asks if she needs to stop taking her blood thinner and aspirin. Please advise.

## 2015-09-10 NOTE — H&P (Signed)
Caitlin Bass is a 64 y.o. Scheduled for Fractional D+C and myosure resection of endometrial mass .h/o PMB and prior endometrial polyp removal . EMBX done that showed no hyperplasia or ca.  U/S : shows multiple small fibroids 29x27 mm, 23x22 mm,37x32 mm,22 x 25 mm    Past Medical History:  has a past medical history of Anemia; Aneurysm; B12 deficiency; CAD (coronary artery disease); Cervical scoliosis; Diabetes mellitus type 2, uncomplicated; Hypertension; and Insomnia.  Past Surgical History:  has a past surgical history that includes Breast surgery (Right); other surgery; and other surgery. Family History: family history includes Heart disease in her mother. Social History:  reports that she has been smoking. She has been smoking about 0.25 packs per day. She does not have any smokeless tobacco history on file. OB/GYN History:  OB History    Gravida Para Term Preterm AB TAB SAB Ectopic Multiple Living   '1    1  1         ' Allergies: is allergic to aspirin; aspirin; hydrochlorothiazide Medications:  Current Outpatient Prescriptions:  . aspirin 81 MG EC tablet, Take 81 mg by mouth once daily., Disp: , Rfl:  . atorvastatin (LIPITOR) 40 MG tablet, Take 40 mg by mouth once daily., Disp: , Rfl:  . blood glucose meter kit, Use as directed., Disp: , Rfl:  . carvedilol (COREG) 25 MG tablet, Take 25 mg by mouth 2 (two) times daily with meals., Disp: , Rfl:  . chlorthalidone 25 MG tablet, Take 25 mg by mouth once daily., Disp: , Rfl:  . clobetasol (TEMOVATE) 0.05 % cream, Apply topically 2 (two) times daily., Disp: , Rfl:  . clopidogrel (PLAVIX) 75 mg tablet, Take 75 mg by mouth once daily., Disp: , Rfl:  . cromolyn (CROLOM) 4 % ophthalmic solution, 1 drop 4 (four) times daily., Disp: , Rfl:  . cyclobenzaprine (FLEXERIL) 5 MG tablet, Take 5 mg by mouth 3 (three) times daily as needed for Muscle spasms., Disp: , Rfl:  . ERGOCALCIFEROL, VITAMIN D2, (VITAMIN D2 ORAL), Take by mouth.,  Disp: , Rfl:  . insulin GLARGINE (LANTUS) 100 unit/mL injection, Inject subcutaneously nightly. 35 units in am and 40 units in pm, Disp: , Rfl:  . insulin LISPRO (HUMALOG) injection, Inject subcutaneously 3 (three) times daily with meals. 8 units in am, 28 units at lunch, 28 units with supper, Disp: , Rfl:  . isosorbide mononitrate (IMDUR) 30 MG ER tablet, Take 30 mg by mouth once daily., Disp: , Rfl:  . magnesium oxide (MAG-OX) 400 mg tablet, Take 400 mg by mouth once daily., Disp: , Rfl:  . nicotine (NICOTROL) 10 mg cartridge for inhalation, Inhale 1 inhalation into the lungs as needed for Smoking cessation., Disp: , Rfl:  . nicotine polacrilex (NICORETTE) 2 mg gum, Take 2 mg by mouth as needed for Smoking cessation. Chew slowly until it tingles, then park gum between cheek and gum until tingle is gone; repeat process until most of the tingle is gone (~30 minutes)., Disp: , Rfl:  . nitroGLYcerin (NITROSTAT) 0.4 MG SL tablet, Place 0.4 mg under the tongue every 5 (five) minutes as needed for Chest pain. May take up to 3 doses., Disp: , Rfl:  . ranitidine (ZANTAC) 150 MG tablet, Take 150 mg by mouth 2 (two) times daily., Disp: , Rfl:  . triamcinolone 0.1 % ointment, Apply topically 2 (two) times daily., Disp: , Rfl:   Review of Systems: General:   No fatigue or weight loss Eyes:   No vision changes  Ears:   No hearing difficulty Respiratory:   No cough or shortness of breath Pulmonary:   No asthma or shortness of breath Cardiovascular:  No chest pain, palpitations, dyspnea on exertion Gastrointestinal:  No abdominal bloating, chronic diarrhea, constipations, masses, pain or hematochezia Genitourinary:  No hematuria, dysuria, abnormal vaginal discharge, pelvic pain, Menometrorrhagia, + endometrial polyps Lymphatic:  No swollen lymph nodes Musculoskeletal: No muscle weakness Neurologic:  No extremity weakness, syncope, seizure disorder Psychiatric:  No history of depression, delusions or  suicidal/homicidal ideation   Exam:      Vitals:   08/26/15 1509  BP: 128/79  Pulse: 85    Body mass index is 28.15 kg/(m^2).  WDWN black female in NAD  Lungs: CTA  CV : RRR without murmur  Pelvic: tanner stage 5 ,  External genitalia: vulva /labia sebaceous cyst rt labial Urethra: no prolapse Vagina: normal physiologic d/c Cervix: no lesions, no cervical motion tenderness  Uterus: normal size shape and contour, non-tender Adnexa: no mass, non-tender  Saline infusion sonohysterography: betadine prep to the cervix followed by placement of the HSG catheter into the endometrial canal . Sterile H2O is injected while performing a transvaginal u/s . Findings: 23x31 mm endometrial mass c/w a fibroid   Impression:   The primary encounter diagnosis was PMB (postmenopausal bleeding). Diagnoses of Endometrial mass and Sebaceous cyst of labia were also pertinent to this visit. Spoke to her about the importance of removal and evaluation of the mass .   Plan:   Fx D+C and myosure endometrial removal of mass  Risks of the procedure discussed with the pt . Consent signed . All questions answered         Caroline Sauger, MD           Office Visit on 08/26/2015      Department  Name Address Phone Fax  [facility] [address] [place] [address] [phone]  Service Location  Name Address      [address] [place] [address]

## 2015-09-10 NOTE — Patient Instructions (Addendum)
  Your procedure is scheduled on: September 18, 2015(Friday) Report to Day Surgery.[facility]) To find out your arrival time please call [phone] between 1PM - 3PM on September 17, 2015(Thursday).  Remember: Instructions that are not followed completely may result in serious medical risk, up to and including death, or upon the discretion of your surgeon and anesthesiologist your surgery may need to be rescheduled.    __x__ 1. Do not eat food or drink liquids after midnight. No gum chewing or hard candies.     ____ 2. No Alcohol for 24 hours before or after surgery.   ____ 3. Bring all medications with you on the day of surgery if instructed.    __x__ 4. Notify your doctor if there is any change in your medical condition     (cold, fever, infections).     Do not wear jewelry, make-up, hairpins, clips or nail polish.  Do not wear lotions, powders, or perfumes. You may wear deodorant.  Do not shave 48 hours prior to surgery. Men may shave face and neck.  Do not bring valuables to the hospital.    [facility] is not responsible for any belongings or valuables.               Contacts, dentures or bridgework may not be worn into surgery.  Leave your suitcase in the car. After surgery it may be brought to your room.  For patients admitted to the hospital, discharge time is determined by your                treatment team.   Patients discharged the day of surgery will not be allowed to drive home.   Please read over the following fact sheets that you were given:      _x_ Take these medicines the morning of surgery with A SIP OF WATER:    1. Carvedilol  2. Magnesium  3. Ranitidine  4.  5.  6.  __x__ Fleet Enema (as directed)   ____ Use CHG Soap as directed  ____ Use inhalers on the day of surgery  ____ Stop metformin 2 days prior to surgery    __x__ Take 1/2 of usual insulin dose the night before surgery and none on the morning of surgery. (NO INSULIN THE MORNING OF  SURGERY, AND TAKE ONE-HALF OF LANTUS AT BEDTIME ON October 20) __x__ Stop Coumadin/Plavix/aspirin on (CHECK WITH DR, Ouida Sills OFFICE ABOUT STOPPING PLAVIX AND ASPIRIN)  __x__ Stop Anti-inflammatories on (Stop MELOXICAM Now)   ____ Stop supplements until after surgery.    ____ Bring C-Pap to the hospital.

## 2015-09-11 ENCOUNTER — Telehealth: Payer: Self-pay | Admitting: Cardiovascular Disease

## 2015-09-11 NOTE — Telephone Encounter (Signed)
Per verbal from Ignacia Bayley, NP, pt has cardiac clearance for upcoming surgery. Letter faxed to [facility], [phone]  Left voice mail for Weston Settle regarding holding Plavix prior to surgery

## 2015-09-11 NOTE — Telephone Encounter (Signed)
S/w pt regarding upcoming surgery. Pt confirmed she is taking plavix but not aspirin. Denies CP or dyspnea and states she feels "fine". Advised her I will contact [facility]

## 2015-09-11 NOTE — Telephone Encounter (Signed)
She does not appear to be on ASA (apparently intolerant).  She is on plavix.  She was last seen in May and doing well at that time.  Provided that she has not been having any chest pain or dyspnea on exertion, she may begin holding her plavix as advised by surgery and will need to resume as soon as possible in the post-op setting.  If however, she has been experiencing c/p or dyspnea, she will need an office visit prior to surgery for a formal evaluation.  Murray Hodgkins, NP

## 2015-09-11 NOTE — Telephone Encounter (Signed)
Patient is having surgery on 09/18/15   (fractional d&c and myosure of fibroid) First available appt for Arida is 10/26/15.  Patient needs Cardiac Clearance.  Please call Baxter Kail at [place] to discuss.    Can we do clearance w/o appt if she was seen on 04/21/15 Arida?

## 2015-09-16 ENCOUNTER — Telehealth: Payer: Self-pay | Admitting: *Deleted

## 2015-09-16 NOTE — Telephone Encounter (Signed)
S/w pt who states she did not call us today. Called [facility] and spoke with Levada Dy who states they have cardiac clearance from [place].

## 2015-09-16 NOTE — Telephone Encounter (Signed)
(   See previous note)  Request for surgical clearance:  1. What type of surgery is being performed? fractional d&c and myosure of fibroid    2. When is this surgery scheduled? 09/18/15  3. Are there any medications that need to be held prior to surgery and how long? Plavix and Asprin   4. Name of physician performing surgery? Dr Marcello Moores Schermerhorn  5. What is your office phone and fax number? [facility]   Please call pt and let her know if she can do the procedure.

## 2015-09-17 MED ORDER — LACTATED RINGERS IV SOLN: INTRAVENOUS | Status: DC

## 2015-09-18 ENCOUNTER — Ambulatory Visit
Admission: RE | Admit: 2015-09-18 | Discharge: 2015-09-18 | Disposition: A | Discharge status: Home / Self Care | Payer: Medicaid Other | Source: Ambulatory Visit | Attending: Obstetrics and Gynecology | Admitting: Obstetrics and Gynecology

## 2015-09-18 ENCOUNTER — Ambulatory Visit: Payer: Medicaid Other | Admitting: Anesthesiology

## 2015-09-18 ENCOUNTER — Other Ambulatory Visit: Payer: Self-pay | Admitting: Obstetrics and Gynecology

## 2015-09-18 ENCOUNTER — Encounter
Admission: RE | Disposition: A | Discharge status: Home / Self Care | Payer: Self-pay | Source: Ambulatory Visit | Attending: Obstetrics and Gynecology

## 2015-09-18 DIAGNOSIS — L723 Sebaceous cyst: Secondary | ICD-10-CM | POA: Diagnosis not present

## 2015-09-18 DIAGNOSIS — Z886 Allergy status to analgesic agent status: Secondary | ICD-10-CM | POA: Diagnosis not present

## 2015-09-18 DIAGNOSIS — F419 Anxiety disorder, unspecified: Secondary | ICD-10-CM | POA: Insufficient documentation

## 2015-09-18 DIAGNOSIS — Z9889 Other specified postprocedural states: Secondary | ICD-10-CM

## 2015-09-18 DIAGNOSIS — N950 Postmenopausal bleeding: Secondary | ICD-10-CM | POA: Insufficient documentation

## 2015-09-18 DIAGNOSIS — D250 Submucous leiomyoma of uterus: Secondary | ICD-10-CM | POA: Diagnosis not present

## 2015-09-18 DIAGNOSIS — N840 Polyp of corpus uteri: Secondary | ICD-10-CM | POA: Diagnosis not present

## 2015-09-18 DIAGNOSIS — E538 Deficiency of other specified B group vitamins: Secondary | ICD-10-CM | POA: Diagnosis not present

## 2015-09-18 DIAGNOSIS — Z7982 Long term (current) use of aspirin: Secondary | ICD-10-CM | POA: Diagnosis not present

## 2015-09-18 DIAGNOSIS — E119 Type 2 diabetes mellitus without complications: Secondary | ICD-10-CM | POA: Insufficient documentation

## 2015-09-18 DIAGNOSIS — K219 Gastro-esophageal reflux disease without esophagitis: Secondary | ICD-10-CM | POA: Insufficient documentation

## 2015-09-18 DIAGNOSIS — Z794 Long term (current) use of insulin: Secondary | ICD-10-CM | POA: Insufficient documentation

## 2015-09-18 DIAGNOSIS — I2510 Atherosclerotic heart disease of native coronary artery without angina pectoris: Secondary | ICD-10-CM | POA: Insufficient documentation

## 2015-09-18 DIAGNOSIS — I10 Essential (primary) hypertension: Secondary | ICD-10-CM | POA: Diagnosis not present

## 2015-09-18 DIAGNOSIS — I252 Old myocardial infarction: Secondary | ICD-10-CM | POA: Diagnosis not present

## 2015-09-18 DIAGNOSIS — Z888 Allergy status to other drugs, medicaments and biological substances status: Secondary | ICD-10-CM | POA: Insufficient documentation

## 2015-09-18 HISTORY — PX: DILATATION & CURETTAGE/HYSTEROSCOPY WITH MYOSURE: SHX6511

## 2015-09-18 LAB — GLUCOSE, CAPILLARY: GLUCOSE-CAPILLARY: 190 mg/dL — AB (ref 65–99)

## 2015-09-18 SURGERY — DILATATION & CURETTAGE/HYSTEROSCOPY WITH MYOSURE
Anesthesia: General | Site: Uterus | Wound class: Clean Contaminated

## 2015-09-18 MED ORDER — SODIUM CHLORIDE 0.9 % IV SOLN
INTRAVENOUS | Status: DC
  Administered 2015-09-18: 07:00:00 via INTRAVENOUS

## 2015-09-18 MED ORDER — IPRATROPIUM-ALBUTEROL 0.5-2.5 (3) MG/3ML IN SOLN: 3.0000 mL | Freq: Once | RESPIRATORY_TRACT | Status: DC

## 2015-09-18 MED ORDER — PROPOFOL 10 MG/ML IV BOLUS
INTRAVENOUS | Status: DC | PRN
  Administered 2015-09-18: 120 mg via INTRAVENOUS

## 2015-09-18 MED ORDER — HYDROCODONE-ACETAMINOPHEN 5-325 MG PO TABS: 2.0000 | ORAL_TABLET | ORAL | Status: DC | PRN

## 2015-09-18 MED ORDER — CEFOXITIN SODIUM-DEXTROSE 2-2.2 GM-% IV SOLR (PREMIX)
INTRAVENOUS | Status: AC
  Administered 2015-09-18: 2000 mg via INTRAVENOUS
  Filled 2015-09-18: qty 50

## 2015-09-18 MED ORDER — CEFOXITIN SODIUM-DEXTROSE 2-2.2 GM-% IV SOLR (PREMIX)
2.0000 g | INTRAVENOUS | Status: AC
  Administered 2015-09-18: 2000 mg via INTRAVENOUS

## 2015-09-18 MED ORDER — FLEET ENEMA 7-19 GM/118ML RE ENEM: 1.0000 | ENEMA | Freq: Once | RECTAL | Status: DC

## 2015-09-18 MED ORDER — ALBUTEROL SULFATE HFA 108 (90 BASE) MCG/ACT IN AERS
INHALATION_SPRAY | RESPIRATORY_TRACT | Status: DC | PRN
  Administered 2015-09-18: 4 via RESPIRATORY_TRACT

## 2015-09-18 MED ORDER — HYDROCODONE-ACETAMINOPHEN 5-325 MG PO TABS
ORAL_TABLET | ORAL | Status: AC
  Filled 2015-09-18: qty 1

## 2015-09-18 MED ORDER — FAMOTIDINE 20 MG PO TABS
ORAL_TABLET | ORAL | Status: AC
  Filled 2015-09-18: qty 1

## 2015-09-18 MED ORDER — HYDROCODONE-ACETAMINOPHEN 5-325 MG PO TABS
1.0000 | ORAL_TABLET | Freq: Four times a day (QID) | ORAL | Status: DC | PRN
Start: 2015-09-18 — End: 2015-09-18

## 2015-09-18 MED ORDER — HYDROCODONE-ACETAMINOPHEN 5-325 MG PO TABS
1.0000 | ORAL_TABLET | Freq: Four times a day (QID) | ORAL | Status: DC | PRN
  Administered 2015-09-18: 1 via ORAL

## 2015-09-18 MED ORDER — OXYCODONE HCL 5 MG PO TABS: 5.0000 mg | ORAL_TABLET | Freq: Once | ORAL | Status: DC | PRN

## 2015-09-18 MED ORDER — LIDOCAINE HCL (CARDIAC) 20 MG/ML IV SOLN
INTRAVENOUS | Status: DC | PRN
  Administered 2015-09-18: 100 mg via INTRAVENOUS

## 2015-09-18 MED ORDER — EPHEDRINE SULFATE 50 MG/ML IJ SOLN
INTRAMUSCULAR | Status: DC | PRN
  Administered 2015-09-18: 10 mg via INTRAVENOUS
  Administered 2015-09-18 (×2): 5 mg via INTRAVENOUS

## 2015-09-18 MED ORDER — FENTANYL CITRATE (PF) 100 MCG/2ML IJ SOLN
INTRAMUSCULAR | Status: AC
  Filled 2015-09-18: qty 2

## 2015-09-18 MED ORDER — OXYCODONE HCL 5 MG/5ML PO SOLN: 5.0000 mg | Freq: Once | ORAL | Status: DC | PRN

## 2015-09-18 MED ORDER — HYDROCODONE-ACETAMINOPHEN 5-325 MG PO TABS: 2.0000 | ORAL_TABLET | Freq: Four times a day (QID) | ORAL | Status: DC | PRN

## 2015-09-18 MED ORDER — FENTANYL CITRATE (PF) 100 MCG/2ML IJ SOLN
INTRAMUSCULAR | Status: DC | PRN
  Administered 2015-09-18 (×2): 50 ug via INTRAVENOUS

## 2015-09-18 MED ORDER — MIDAZOLAM HCL 2 MG/2ML IJ SOLN
INTRAMUSCULAR | Status: DC | PRN
  Administered 2015-09-18: 2 mg via INTRAVENOUS

## 2015-09-18 MED ORDER — ONDANSETRON HCL 4 MG/2ML IJ SOLN
INTRAMUSCULAR | Status: DC | PRN
  Administered 2015-09-18: 4 mg via INTRAVENOUS

## 2015-09-18 MED ORDER — PHENYLEPHRINE HCL 10 MG/ML IJ SOLN
INTRAMUSCULAR | Status: DC | PRN
  Administered 2015-09-18: 100 ug via INTRAVENOUS
  Administered 2015-09-18 (×3): 200 ug via INTRAVENOUS

## 2015-09-18 MED ORDER — FENTANYL CITRATE (PF) 100 MCG/2ML IJ SOLN: 25.0000 ug | INTRAMUSCULAR | Status: DC | PRN

## 2015-09-18 MED ORDER — IPRATROPIUM-ALBUTEROL 0.5-2.5 (3) MG/3ML IN SOLN
RESPIRATORY_TRACT | Status: AC
  Administered 2015-09-18: 3 mL
  Filled 2015-09-18: qty 3

## 2015-09-18 SURGICAL SUPPLY — 21 items
CANISTER SUC SOCK COL 7IN (MISCELLANEOUS) ×3 IMPLANT
CANISTER SUCT 3000ML (MISCELLANEOUS) ×3 IMPLANT
CATH ROBINSON RED A/P 16FR (CATHETERS) ×3 IMPLANT
GLOVE BIO SURGEON STRL SZ8 (GLOVE) ×3 IMPLANT
GOWN STRL REUS W/ TWL LRG LVL3 (GOWN DISPOSABLE) ×1 IMPLANT
GOWN STRL REUS W/ TWL XL LVL3 (GOWN DISPOSABLE) ×1 IMPLANT
GOWN STRL REUS W/TWL LRG LVL3 (GOWN DISPOSABLE) ×2
GOWN STRL REUS W/TWL XL LVL3 (GOWN DISPOSABLE) ×2
KIT RM TURNOVER CYSTO AR (KITS) ×3 IMPLANT
MYOSURE LITE POLYP REMOVAL (MISCELLANEOUS) ×3 IMPLANT
MYOSURE XL FIBROID REM (MISCELLANEOUS) ×3
PACK DNC HYST (MISCELLANEOUS) ×3 IMPLANT
PAD OB MATERNITY 4.3X12.25 (PERSONAL CARE ITEMS) ×3 IMPLANT
PAD PREP 24X41 OB/GYN DISP (PERSONAL CARE ITEMS) ×3 IMPLANT
SOL .9 NS 3000ML IRR  AL (IV SOLUTION) ×2
SOL .9 NS 3000ML IRR UROMATIC (IV SOLUTION) ×1 IMPLANT
SYSTEM TISS REMOVAL MYSR XL RM (MISCELLANEOUS) ×1 IMPLANT
TOWEL OR 17X26 4PK STRL BLUE (TOWEL DISPOSABLE) ×3 IMPLANT
TUBING CONNECTING 10 (TUBING) ×4 IMPLANT
TUBING CONNECTING 10' (TUBING) ×2
TUBING HYSTEROSCOPY DOLPHIN (MISCELLANEOUS) ×6 IMPLANT

## 2015-09-18 NOTE — Progress Notes (Signed)
Pt cleared by cardiology and nephrology . Interviewed today . No changes . NPO . All questions answered . Proceed with Fx D+C and myosure resection .

## 2015-09-18 NOTE — Anesthesia Preprocedure Evaluation (Addendum)
Anesthesia Evaluation  Patient identified by MRN, date of birth, ID band Patient awake    Reviewed: Allergy & Precautions, H&P , NPO status , Patient's Chart, lab work & pertinent test results  Airway Mallampati: II  TM Distance: >3 FB Neck ROM: full    Dental no notable dental hx. (+) Teeth Intact   Pulmonary Current Smoker,    Pulmonary exam normal breath sounds clear to auscultation       Cardiovascular Exercise Tolerance: Good hypertension, (-) angina+ CAD, + Past MI and + Cardiac Stents  (-) DOE Normal cardiovascular exam Rhythm:regular Rate:Normal     Neuro/Psych PSYCHIATRIC DISORDERS Anxiety  Neuromuscular disease    GI/Hepatic Neg liver ROS, hiatal hernia, GERD  Controlled,  Endo/Other  diabetes, Well Controlled, Type 2, Insulin Dependent  Renal/GU CRFRenal disease  negative genitourinary   Musculoskeletal   Abdominal   Peds  Hematology negative hematology ROS (+)   Anesthesia Other Findings Past Medical History:   Coronary artery disease                                        Comment:a. 03/2014 NSTEMI/Cath: LM nl, LAD 21m, 60d, D1               60, D2 nl, d3 40, LCX 100 w/ L->L collats, RCA               non dom, min irregs, EF 55%.   Diabetes mellitus without complication ([place])                 Hypertension                                                 CKD (chronic kidney disease), stage IV ([facility])                 GERD (gastroesophageal reflux disease)                       DDD (degenerative disc disease)                                Comment:a. s/p upper back surgery spring 2015.   Arthritis                                                    Hiatal hernia                                                Hyperlipidemia                                               Syncope and collapse  Comment:a. Early 03/2014->did not seek medical attn.   Myocardial infarction  ([place])                     2015         Anxiety                                                      Fibromyalgia                                                 Anemia                                                       Diverticulosis                                              Past Surgical History:   NECK SURGERY                                                    Comment:Degenerative Disk Disease and removal of a               spinal cyst   CARDIAC CATHETERIZATION                          06/2005         Comment:[facility]   CARDIAC CATHETERIZATION                          04/2014         Comment:[facility]   TUBAL LIGATION                                                BREAST CYST ASPIRATION                          Right 2005           Comment:NEG   CORONARY ANGIOPLASTY                                         BMI    Body Mass Index   28.30 kg/m 2    Patient reports cardiac clearance for this procedure.  Reproductive/Obstetrics negative OB ROS                            Anesthesia Physical Anesthesia Plan  ASA: III  Anesthesia Plan: General   Post-op Pain Management:    Induction:   Airway Management Planned:   Additional Equipment:   Intra-op Plan:   Post-operative Plan:   Informed Consent: I have reviewed the patients History and Physical, chart, labs and discussed the procedure including the risks, benefits and alternatives for the proposed anesthesia with the patient or authorized representative who has indicated his/her understanding and acceptance.   Dental Advisory Given  Plan Discussed with: Anesthesiologist, CRNA and Surgeon  Anesthesia Plan Comments: (Patient informed that they are higher risk for complications from anesthesia during this procedure due to their medical history.  Patient voiced understanding. )       Anesthesia Quick Evaluation

## 2015-09-18 NOTE — Brief Op Note (Signed)
09/18/2015  8:27 AM  PATIENT:  Caitlin Bass  64 y.o. female  PRE-OPERATIVE DIAGNOSIS:  ENDOMETRIAL MASS  POST-OPERATIVE DIAGNOSIS:  3x3 cm submucosal fibroid  PROCEDURE:  Procedure(s): DILATATION & CURETTAGE/HYSTEROSCOPY WITH MYOSURE (N/A)  SURGEON:  Surgeon(s) and Role:    Boykin Nearing, MD - Primary  PHYSICIAN ASSISTANT:   ASSISTANTS: none   ANESTHESIA:   none  EBL:  Total I/O In: 450 [I.V.:450] Out: 5 [Blood:5]  BLOOD ADMINISTERED:none  DRAINS: none   LOCAL MEDICATIONS USED:  NONE  SPECIMEN:  Source of Specimen:  ECC , polyp and submucosal fibroid resection   DISPOSITION OF SPECIMEN:  PATHOLOGY  COUNTS:  YES  TOURNIQUET:  * No tourniquets in log *  DICTATION: .Other Dictation: Dictation Number verbal   PLAN OF CARE: Discharge to home after PACU  PATIENT DISPOSITION:  PACU - hemodynamically stable.   Delay start of Pharmacological VTE agent (>24hrs) due to surgical blood loss or risk of bleeding: not applicable

## 2015-09-18 NOTE — Discharge Instructions (Signed)
AMBULATORY SURGERY  °DISCHARGE INSTRUCTIONS ° ° °1) The drugs that you were given will stay in your system until tomorrow so for the next 24 hours you should not: ° °A) Drive an automobile °B) Make any legal decisions °C) Drink any alcoholic beverage ° ° °2) You may resume regular meals tomorrow.  Today it is better to start with liquids and gradually work up to solid foods. ° °You may eat anything you prefer, but it is better to start with liquids, then soup and crackers, and gradually work up to solid foods. ° ° °3) Please notify your doctor immediately if you have any unusual bleeding, trouble breathing, redness and pain at the surgery site, drainage, fever, or pain not relieved by medication. ° ° ° °4) Additional Instructions: ° ° ° ° ° ° ° °Please contact your physician with any problems or Same Day Surgery at [phone], Monday through Friday 6 am to 4 pm, or [facility] [place] [facility] number at [phone]. °

## 2015-09-18 NOTE — Op Note (Signed)
Caitlin Bass, Caitlin Bass            ACCOUNT NO.:  [id number]  MEDICAL RECORD NO.:  [id number]  LOCATION:  [facility]                         FACILITY:  [facility]  PHYSICIAN:  Laverta Baltimore, MDDATE OF BIRTH:  02-Feb-1951  DATE OF PROCEDURE: DATE OF DISCHARGE:  09/18/2015                              OPERATIVE REPORT   PREOPERATIVE DIAGNOSIS: 1. Postmenopausal bleeding. 2. Submucosal fibroid.  POSTOPERATIVE DIAGNOSIS: 1. Postmenopausal bleeding. 2. Submucosal fibroid. 3. Endometrial polyp.  ANESTHESIA:  LMA.  SURGEON:  Laverta Baltimore, M.D.  INDICATIONS:  A 64 year old, gravida 1, para 0 patient with postmenopausal bleeding and evidence of a 3 x 3 cm submucosal endometrial fibroid.  DESCRIPTION OF PROCEDURE:  After adequate general LMA, the patient was taken to and the patient was prepped and draped in normal sterile fashion.  The patient did receive 2 g IV cefoxitin prior to commencement of the case.  Time-out was performed.  Straight catheterization of the bladder yielded 100 mL of dark urine.  A weighted speculum was placed in the posterior vaginal vault.  The anterior cervix was grasped with a single-tooth tenaculum.  The cervix.  An endocervical curettage was performed.  Uterus sounded to 8 cm.  The cervix was then dilated to #18 Hanks dilator without difficulty.  The hysteroscope was advanced into the endometrial cavity without difficulty and a large 3 x 3 cm fibroid at the right anterior lateral positioned and a smaller 7 x 7 mm polyp was noted at the left lateral area.  The MyoSure was brought up to the operative field and advanced into the endometrial cavity.  Resection of the submucosal fibroid and the polyp ensued and total MyoSure time of 2 minutes and 43 seconds.  Good hemostasis was noted.  Fluid used normal saline 3300 mL with a deficit of 40 mL.  Instruments were removed.  Good hemostasis again was noted.  There were no complications.  INTRAOPERATIVE FLUIDS:   400 mL.  ESTIMATED BLOOD LOSS:  Minimal.  DISPOSITION:  The patient was taken to the recovery room in good condition.          ______________________________ Laverta Baltimore, MD     TS/MEDQ  D:  09/18/2015  T:  09/18/2015  Job:  [id number]

## 2015-09-18 NOTE — Anesthesia Procedure Notes (Signed)
Procedure Name: LMA Insertion Date/Time: 09/18/2015 7:43 AM Performed by: Silvana Newness Pre-anesthesia Checklist: Patient identified, Emergency Drugs available, Suction available, Patient being monitored and Timeout performed Patient Re-evaluated:Patient Re-evaluated prior to inductionOxygen Delivery Method: Circle system utilized Preoxygenation: Pre-oxygenation with 100% oxygen Intubation Type: IV induction Ventilation: Mask ventilation without difficulty LMA: LMA inserted LMA Size: 4.0 Number of attempts: 2 Placement Confirmation: positive ETCO2 and breath sounds checked- equal and bilateral Tube secured with: Tape Dental Injury: Teeth and Oropharynx as per pre-operative assessment  Comments: LMA 3.5 placed large leak.  Changed to LMA 4

## 2015-09-18 NOTE — Anesthesia Postprocedure Evaluation (Signed)
  Anesthesia Post-op Note  Patient: Caitlin Bass  Procedure(s) Performed: Procedure(s): DILATATION & CURETTAGE/HYSTEROSCOPY WITH MYOSURE (N/A)  Anesthesia type:General  Patient location: PACU  Post pain: Pain level controlled  Post assessment: Post-op Vital signs reviewed, Patient's Cardiovascular Status Stable, Respiratory Function Stable, Patent Airway and No signs of Nausea or vomiting  Post vital signs: Reviewed and stable  Last Vitals:  Filed Vitals:   09/18/15 1005  BP: 130/91  Pulse: 75  Temp:   Resp: 18    Level of consciousness: awake, alert  and patient cooperative  Complications: No apparent anesthesia complications

## 2015-09-18 NOTE — Transfer of Care (Signed)
Immediate Anesthesia Transfer of Care Note  Patient: Caitlin Bass  Procedure(s) Performed: Procedure(s): DILATATION & CURETTAGE/HYSTEROSCOPY WITH MYOSURE (N/A)  Patient Location: PACU  Anesthesia Type:General  Level of Consciousness: sedated  Airway & Oxygen Therapy: Patient Spontanous Breathing and Patient connected to face mask oxygen  Post-op Assessment: Report given to RN and Post -op Vital signs reviewed and stable  Post vital signs: Reviewed and stable  Last Vitals: 96.4 temp 100% sat 124/76 22resp 79 hr Filed Vitals:   09/18/15 0641  BP: 133/68  Temp: 36.2 C  Resp: 16    Complications: No apparent anesthesia complications

## 2015-09-21 LAB — SURGICAL PATHOLOGY

## 2015-10-26 ENCOUNTER — Encounter: Payer: Self-pay | Admitting: Cardiovascular Disease

## 2015-10-26 ENCOUNTER — Ambulatory Visit (INDEPENDENT_AMBULATORY_CARE_PROVIDER_SITE_OTHER): Payer: Medicaid Other | Admitting: Cardiovascular Disease

## 2015-10-26 ENCOUNTER — Inpatient Hospital Stay: Payer: Medicaid Other | Attending: Internal Medicine

## 2015-10-26 VITALS — BP 184/78 | HR 96 | Ht 63.0 in | Wt 161.0 lb

## 2015-10-26 DIAGNOSIS — D808 Other immunodeficiencies with predominantly antibody defects: Secondary | ICD-10-CM | POA: Insufficient documentation

## 2015-10-26 DIAGNOSIS — I2510 Atherosclerotic heart disease of native coronary artery without angina pectoris: Secondary | ICD-10-CM

## 2015-10-26 DIAGNOSIS — I10 Essential (primary) hypertension: Secondary | ICD-10-CM | POA: Diagnosis not present

## 2015-10-26 DIAGNOSIS — D8989 Other specified disorders involving the immune mechanism, not elsewhere classified: Secondary | ICD-10-CM

## 2015-10-26 DIAGNOSIS — E785 Hyperlipidemia, unspecified: Secondary | ICD-10-CM

## 2015-10-26 LAB — CBC WITH DIFFERENTIAL/PLATELET
BASOS PCT: 1 %
Basophils Absolute: 0.1 10*3/uL (ref 0–0.1)
EOS ABS: 0.2 10*3/uL (ref 0–0.7)
EOS PCT: 2 %
HCT: 29.8 % — ABNORMAL LOW (ref 35.0–47.0)
HEMOGLOBIN: 9.5 g/dL — AB (ref 12.0–16.0)
Lymphocytes Relative: 34 %
Lymphs Abs: 3 10*3/uL (ref 1.0–3.6)
MCH: 28 pg (ref 26.0–34.0)
MCHC: 31.8 g/dL — ABNORMAL LOW (ref 32.0–36.0)
MCV: 88.1 fL (ref 80.0–100.0)
Monocytes Absolute: 0.7 10*3/uL (ref 0.2–0.9)
Monocytes Relative: 8 %
NEUTROS PCT: 55 %
Neutro Abs: 4.8 10*3/uL (ref 1.4–6.5)
PLATELETS: 365 10*3/uL (ref 150–440)
RBC: 3.39 MIL/uL — AB (ref 3.80–5.20)
RDW: 18.9 % — ABNORMAL HIGH (ref 11.5–14.5)
WBC: 8.7 10*3/uL (ref 3.6–11.0)

## 2015-10-26 LAB — CREATININE, SERUM
CREATININE: 1.95 mg/dL — AB (ref 0.44–1.00)
GFR calc Af Amer: 30 mL/min — ABNORMAL LOW (ref >60)
GFR, EST NON AFRICAN AMERICAN: 26 mL/min — AB (ref >60)

## 2015-10-26 LAB — CALCIUM: CALCIUM: 9.7 mg/dL (ref 8.9–10.3)

## 2015-10-26 MED ORDER — AMLODIPINE BESYLATE 5 MG PO TABS: 5.0000 mg | ORAL_TABLET | Freq: Every day | ORAL | Status: DC

## 2015-10-26 MED ORDER — METOPROLOL TARTRATE 25 MG PO TABS: 25.0000 mg | ORAL_TABLET | Freq: Two times a day (BID) | ORAL | Status: DC

## 2015-10-26 NOTE — Assessment & Plan Note (Signed)
She reports significant dizziness whenever she takes her blood pressure medications and has only been taking lisinopril (the last few days. Her blood pressure is significantly elevated today. She also reports having issues with gout recently. Thus, I think it's a good idea to discontinue chlorthalidone which is known to cause dizziness and increased uric acid. She might be also having side effects from carvedilol and Imdur. She hasn't had really any recent angina and thus I discontinued Imdur. I elected to switch carvedilol to metoprolol 25 mg twice daily. I also added amlodipine 5 mg once daily. I am hoping that she will tolerate these blood pressure medications better.

## 2015-10-26 NOTE — Assessment & Plan Note (Signed)
She is doing reasonably well from a cardiac standpoint with no recurrent angina. Continue medical therapy. I discussed with him the importance of compliance with medications.

## 2015-10-26 NOTE — Progress Notes (Signed)
HPI  64 y/o female who is here today for a followup visit regarding coronary artery disease.  She had non-ST elevation myocardial infarction in May 2015. Echo showed nl LV fxn. Cardiac catheterization showed an occluded LCX with L-L collats and otherwise nonobstructive CAD. She was medically managed . She underwent D&C in October without complications. She reports extreme dizziness and seeing yellow dots what ever she takes her blood pressure medications. Due to that, she stopped taking carvedilol, chlorthalidone and isosorbide. She has only been taking small dose lisinopril. She does have known history of chronic kidney disease with a GFR around 32. Since stopping the blood pressure medications, she reports improvement and dizziness. She denies any chest pain. She does complain of increased palpitations over the last few days.     Allergies  Allergen Reactions  . Aspirin Other (See Comments)    "pt. has a history of ulcers"  . Hydrochlorothiazide Itching     Current Outpatient Prescriptions on File Prior to Visit  Medication Sig Dispense Refill  . allopurinol (ZYLOPRIM) 100 MG tablet Take 100 mg by mouth 2 (two) times daily.    Marland Kitchen aspirin 81 MG chewable tablet Chew 81 mg by mouth daily.    Marland Kitchen atorvastatin (LIPITOR) 40 MG tablet Take 40 mg by mouth daily at 6 PM.     . cholecalciferol (VITAMIN D) 1000 UNITS tablet Take 2,000 Units by mouth 2 (two) times daily.     . clopidogrel (PLAVIX) 75 MG tablet Take 1 tablet (75 mg total) by mouth daily. 30 tablet 11  . cyclobenzaprine (FLEXERIL) 5 MG tablet Take 5 mg by mouth as needed for muscle spasms.    Marland Kitchen HYDROcodone-acetaminophen (NORCO) 5-325 MG tablet Take 2 tablets by mouth every 6 (six) hours as needed for severe pain (take 1 tab every 6 hours for moderate pain - PLEASE TAKE WITH COLACE/STOOL SOFTENER TO PREVENT CONSTIPATION). 45 tablet 0  . insulin glargine (LANTUS) 100 UNIT/ML injection Inject 35 Units into the skin every morning. 44 units  at bedtime    . Insulin Lispro, Human, (HUMALOG KWIKPEN [place]) Inject 8 Units into the skin every morning. 28 units at lunch and supper    . lisinopril (PRINIVIL,ZESTRIL) 5 MG tablet Take 5 mg by mouth at bedtime.    . magnesium oxide (MAG-OX) 400 MG tablet Take 400 mg by mouth 2 (two) times daily.    . meloxicam (MOBIC) 15 MG tablet Take 15 mg by mouth daily.    . nitroGLYCERIN (NITROSTAT) 0.4 MG SL tablet Place 1 tablet (0.4 mg total) under the tongue every 5 (five) minutes as needed for chest pain. 25 tablet 1  . ranitidine (ZANTAC) 150 MG tablet Take 150 mg by mouth 2 (two) times daily.    Marland Kitchen triamcinolone cream (KENALOG) 0.1 % Apply 1 application topically 2 (two) times daily.     No current facility-administered medications on file prior to visit.     Past Medical History  Diagnosis Date  . Coronary artery disease     a. 03/2014 NSTEMI/Cath: LM nl, LAD 35m, 60d, D1 60, D2 nl, d3 40, LCX 100 w/ L->L collats, RCA non dom, min irregs, EF 55%.  . Diabetes mellitus without complication ([place])   . Hypertension   . CKD (chronic kidney disease), stage IV ([place])   . GERD (gastroesophageal reflux disease)   . DDD (degenerative disc disease)     a. s/p upper back surgery spring 2015.  Marland Kitchen Arthritis   . Hiatal  hernia   . Hyperlipidemia   . Syncope and collapse     a. Early 03/2014->did not seek medical attn.  . Myocardial infarction ([place]) 2015  . Anxiety   . Fibromyalgia   . Anemia   . Diverticulosis      Past Surgical History  Procedure Laterality Date  . Neck surgery      Degenerative Disk Disease and removal of a spinal cyst  . Cardiac catheterization  06/2005    [facility]  . Cardiac catheterization  04/2014    [facility]  . Tubal ligation    . Breast cyst aspiration Right 2005    NEG  . Coronary angioplasty    . Dilatation & curettage/hysteroscopy with myosure N/A 09/18/2015    Procedure: DILATATION & CURETTAGE/HYSTEROSCOPY WITH MYOSURE;  Surgeon: Boykin Nearing, MD;  Location: [facility]  ORS;  Service: Gynecology;  Laterality: N/A;     Family History  Problem Relation Age of Onset  . Heart disease Mother   . Heart attack Mother   . Hypertension Mother   . Hyperlipidemia Mother   . Breast cancer Sister      Social History   Social History  . Marital Status: Widowed    Spouse Name: N/A  . Number of Children: N/A  . Years of Education: N/A   Occupational History  . Not on file.   Social History Main Topics  . Smoking status: Current Every Day Smoker -- 0.25 packs/day for 40 years    Types: Cigarettes  . Smokeless tobacco: Never Used  . Alcohol Use: No  . Drug Use: No  . Sexual Activity: Not on file   Other Topics Concern  . Not on file   Social History Narrative      PHYSICAL EXAM   BP 184/78 mmHg  Pulse 96  Ht 5\' 3"  (1.6 m)  Wt 161 lb (73.029 kg)  BMI 28.53 kg/m2 Constitutional: She is oriented to person, place, and time. She appears well-developed and well-nourished. No distress.  HENT: No nasal discharge.  Head: Normocephalic and atraumatic.  Eyes: Pupils are equal and round. No discharge.  Neck: Normal range of motion. Neck supple. No JVD present. No thyromegaly present.  Cardiovascular: Normal rate, regular rhythm, normal heart sounds. Exam reveals no gallop and no friction rub. No murmur heard.  Pulmonary/Chest: Effort normal and breath sounds normal. No stridor. No respiratory distress. She has no wheezes. She has no rales. She exhibits no tenderness.  Abdominal: Soft. Bowel sounds are normal. She exhibits no distension. There is no tenderness. There is no rebound and no guarding.  Musculoskeletal: Normal range of motion. She exhibits no edema and no tenderness.  Neurological: She is alert and oriented to person, place, and time. Coordination normal.  Skin: Skin is warm and dry. No rash noted. She is not diaphoretic. No erythema. No pallor.  Psychiatric: She has a normal mood and affect. Her behavior is normal. Judgment and thought  content normal.     EKG: Sinus  Rhythm  -Left axis -anterior fascicular block.   -  T-abnormality  - Anterolateral ischemia.   ABNORMAL     ASSESSMENT AND PLAN

## 2015-10-26 NOTE — Patient Instructions (Signed)
Medication Instructions:  Your physician has recommended you make the following change in your medication:  STOP taking coreg STOP taking imdur STOP taking chlorthalidone START taking metoprolol 25mg  twice per day START taking amlodipine 5mg  once per day   Labwork: none  Testing/Procedures: none  Follow-Up: Your physician recommends that you schedule a follow-up appointment in: one month with Dr. Fletcher Anon.    Any Other Special Instructions Will Be Listed Below (If Applicable).     If you need a refill on your cardiac medications before your next appointment, please call your pharmacy.

## 2015-10-26 NOTE — Assessment & Plan Note (Signed)
Continue treatment with atorvastatin with a target LDL of less than 70. 

## 2015-10-27 LAB — KAPPA/LAMBDA LIGHT CHAINS
KAPPA FREE LGHT CHN: 32.41 mg/L — AB (ref 3.30–19.40)
KAPPA, LAMDA LIGHT CHAIN RATIO: 1.7 — AB (ref 0.26–1.65)
Lambda free light chains: 19.12 mg/L (ref 5.71–26.30)

## 2015-10-28 LAB — PROTEIN ELECTROPHORESIS, SERUM
A/G Ratio: 1.2 (ref 0.7–1.7)
ALBUMIN ELP: 3.9 g/dL (ref 2.9–4.4)
ALPHA-1-GLOBULIN: 0.2 g/dL (ref 0.0–0.4)
ALPHA-2-GLOBULIN: 1 g/dL (ref 0.4–1.0)
Beta Globulin: 1.1 g/dL (ref 0.7–1.3)
GLOBULIN, TOTAL: 3.2 g/dL (ref 2.2–3.9)
Gamma Globulin: 0.9 g/dL (ref 0.4–1.8)
TOTAL PROTEIN ELP: 7.1 g/dL (ref 6.0–8.5)

## 2015-11-26 ENCOUNTER — Encounter: Payer: Self-pay | Admitting: Nurse Practitioner

## 2015-11-26 ENCOUNTER — Ambulatory Visit (INDEPENDENT_AMBULATORY_CARE_PROVIDER_SITE_OTHER): Payer: Medicaid Other | Admitting: Nurse Practitioner

## 2015-11-26 VITALS — BP 150/84 | HR 73 | Ht 63.0 in | Wt 161.5 lb

## 2015-11-26 DIAGNOSIS — E785 Hyperlipidemia, unspecified: Secondary | ICD-10-CM | POA: Diagnosis not present

## 2015-11-26 DIAGNOSIS — I2510 Atherosclerotic heart disease of native coronary artery without angina pectoris: Secondary | ICD-10-CM | POA: Diagnosis not present

## 2015-11-26 DIAGNOSIS — N184 Chronic kidney disease, stage 4 (severe): Secondary | ICD-10-CM

## 2015-11-26 DIAGNOSIS — Z720 Tobacco use: Secondary | ICD-10-CM

## 2015-11-26 DIAGNOSIS — I119 Hypertensive heart disease without heart failure: Secondary | ICD-10-CM | POA: Diagnosis not present

## 2015-11-26 NOTE — Progress Notes (Signed)
Office Visit    Patient Name: Caitlin Bass Date of Encounter: 11/26/2015  Primary Care Provider:  [facility] Primary Cardiologist:  M. Fletcher Anon, MD   Chief Complaint    64 y/o female with a h/o CAD s/p NSTEMI in 03/2014 who presents for f/u.  Past Medical History    Past Medical History  Diagnosis Date  . Coronary artery disease     a. 03/2014 NSTEMI/Cath: LM nl, LAD 34m, 60d, D1 60, D2 nl, d3 40, LCX 100 w/ L->L collats, RCA non dom, min irregs, EF 55%-->Med Rx.  . Diabetes mellitus without complication ([place])   . Hypertensive heart disease     a. 03/2015 Echo: 55-60%, mild conc LVH.  . CKD (chronic kidney disease), stage IV ([place])   . GERD (gastroesophageal reflux disease)   . DDD (degenerative disc disease)     a. s/p upper back surgery spring 2015.  Marland Kitchen Arthritis   . Hiatal hernia   . Hyperlipidemia   . Syncope and collapse     a. Early 03/2014->did not seek medical attn.  . Anxiety   . Fibromyalgia   . Anemia   . Diverticulosis   . Tobacco abuse    Past Surgical History  Procedure Laterality Date  . Neck surgery      Degenerative Disk Disease and removal of a spinal cyst  . Cardiac catheterization  06/2005    [facility]  . Cardiac catheterization  04/2014    [facility]  . Tubal ligation    . Breast cyst aspiration Right 2005    NEG  . Coronary angioplasty    . Dilatation & curettage/hysteroscopy with myosure N/A 09/18/2015    Procedure: DILATATION & CURETTAGE/HYSTEROSCOPY WITH MYOSURE;  Surgeon: Boykin Nearing, MD;  Location: [facility] ORS;  Service: Gynecology;  Laterality: N/A;    Allergies  Allergies  Allergen Reactions  . Aspirin Other (See Comments)    "pt. has a history of ulcers"  . Hydrochlorothiazide Itching    History of Present Illness    64 y/o female with the above complex problem list.  She is s/p NSTEMI in 03/2014 with finding of an occluded LCX with L L collaterals and has since been medically managed. She was last seen on  11/28 with complaints of dizziness after taking her BP meds.  Chlorthalidone, coreg, and imdur were discontinued while lopressor 25 BID and amlodipine 5 mg were initiated as BP was 184/78 that day.  Since then, she has felt much better, noting that she now has much more energy and has not had recurrence of dizziness.  Her BP's have been variable @ home but notes that she [id number] systolic @ her PCP's office yesterday.  She denies chest pain, palpitations, dyspnea, pnd, orthopnea, n, v, dizziness, syncope, edema, weight gain, or early satiety.   Home Medications    Prior to Admission medications   Medication Sig Start Date End Date Taking? Authorizing Provider  allopurinol (ZYLOPRIM) 100 MG tablet Take 100 mg by mouth 2 (two) times daily.   Yes Historical Provider, MD  amLODipine (NORVASC) 5 MG tablet Take 1 tablet (5 mg total) by mouth daily. 10/26/15  Yes Wellington Hampshire, MD  aspirin 81 MG chewable tablet Chew 81 mg by mouth daily.   Yes Historical Provider, MD  atorvastatin (LIPITOR) 40 MG tablet Take 40 mg by mouth daily at 6 PM.    Yes Historical Provider, MD  cholecalciferol (VITAMIN D) 1000 UNITS tablet Take 2,000 Units by  mouth 2 (two) times daily.    Yes Historical Provider, MD  clopidogrel (PLAVIX) 75 MG tablet Take 1 tablet (75 mg total) by mouth daily. 04/21/15  Yes Wellington Hampshire, MD  cyclobenzaprine (FLEXERIL) 5 MG tablet Take 5 mg by mouth as needed for muscle spasms.   Yes Historical Provider, MD  HYDROcodone-acetaminophen (NORCO) 5-325 MG tablet Take 2 tablets by mouth every 6 (six) hours as needed for severe pain (take 1 tab every 6 hours for moderate pain - PLEASE TAKE WITH COLACE/STOOL SOFTENER TO PREVENT CONSTIPATION). 09/18/15  Yes Lorette Ang, MD  insulin glargine (LANTUS) 100 UNIT/ML injection Inject 35 Units into the skin every morning. 44 units at bedtime   Yes Historical Provider, MD  Insulin Lispro, Human, (HUMALOG KWIKPEN [place]) Inject 8 Units into the skin every morning. 28  units at lunch and supper   Yes Historical Provider, MD  lisinopril (PRINIVIL,ZESTRIL) 5 MG tablet Take 5 mg by mouth at bedtime.   Yes Historical Provider, MD  magnesium oxide (MAG-OX) 400 MG tablet Take 400 mg by mouth 2 (two) times daily.   Yes Historical Provider, MD  meloxicam (MOBIC) 15 MG tablet Take 15 mg by mouth daily.   Yes Historical Provider, MD  metoprolol tartrate (LOPRESSOR) 25 MG tablet Take 1 tablet (25 mg total) by mouth 2 (two) times daily. 10/26/15  Yes Wellington Hampshire, MD  nitroGLYCERIN (NITROSTAT) 0.4 MG SL tablet Place 1 tablet (0.4 mg total) under the tongue every 5 (five) minutes as needed for chest pain. 05/06/14  Yes Rogelia Mire, NP  ranitidine (ZANTAC) 150 MG tablet Take 150 mg by mouth 2 (two) times daily.   Yes Historical Provider, MD  triamcinolone cream (KENALOG) 0.1 % Apply 1 application topically 2 (two) times daily.   Yes Historical Provider, MD    Review of Systems    As above, feeling better w/o recurrent dizziness.  She denies chest pain, palpitations, dyspnea, pnd, orthopnea, n, v, dizziness, syncope, edema, weight gain, or early satiety.  All other systems reviewed and are otherwise negative except as noted above.  Physical Exam    VS:  BP 150/84 mmHg  Pulse 73  Ht 5\' 3"  (1.6 m)  Wt 161 lb 8 oz (73.256 kg)  BMI 28.62 kg/m2 , BMI Body mass index is 28.62 kg/(m^2).  BP 142/60 on repeat. GEN: Well nourished, well developed, in no acute distress. HEENT: normal. Neck: Supple, no JVD, carotid bruits, or masses. Cardiac: RRR, rubs, or gallops. 2/6 SEM throughout - loudest @ upper sternal borders.  No clubbing, cyanosis, edema.  Radials/DP/PT 2+ and equal bilaterally.  Respiratory:  Respirations regular and unlabored, clear to auscultation bilaterally. GI: Soft, nontender, nondistended, BS + x 4. MS: no deformity or atrophy. Skin: warm and dry, no rash. Neuro:  Strength and sensation are intact. Psych: Normal affect.  Accessory Clinical  Findings    None  Assessment & Plan    1.  CAD:  Doing well w/o chest pain or significant DOE.  She remains on asa, statin, bb, acei.  2.  Hypertensive Heart Disease:  BP improved since changing regimen @ last visit. She is tolerating metoprolol, amlodipine, and lisinopril.  BP 111 @ PCP office yesterday per pt report. BP 142/60 on repeat today.  She has a cuff @ home.  Given h/o dizziness on antihypertensive rx, rather than titrating meds today, I've asked her to check her BP daily @ home and inform us if she is consistently trending above  [id number] mmHg systolic.  If so, we could increase amlodipine to 10 mg daily.  3.  HL:  On statin therapy.  TC 291 in 06/2013.  LFTs nl in 09/2014.  This is followed by primary care.  4.  Stage IV CKD:  Creat recently stable on 11/28.  She is on lisinopril therapy.  5.  Tob Abuse:  Complete cessation advised.  She smokes 2-3 cigarettes/day.  She is not willing to commit to quitting at this time.  6.  Disposition:  Take BP daily and record.  F/U Dr. Fletcher Anon in 6 mos or sooner if necessary.  Murray Hodgkins, NP 11/26/2015, 11:46 AM

## 2015-11-26 NOTE — Patient Instructions (Addendum)
Medication Instructions:  Please continue your current medications STOP SMOKING!  Labwork: None  Testing/Procedures: None  Follow-Up: 6 months w/ Dr. Fletcher Anon  If you need a refill on your cardiac medications before your next appointment, please call your pharmacy.  Steps to Quit Smoking  Smoking tobacco can be harmful to your health and can affect almost every organ in your body. Smoking puts you, and those around you, at risk for developing many serious chronic diseases. Quitting smoking is difficult, but it is one of the best things that you can do for your health. It is never too late to quit. WHAT ARE THE BENEFITS OF QUITTING SMOKING? When you quit smoking, you lower your risk of developing serious diseases and conditions, such as:  Lung cancer or lung disease, such as COPD.  Heart disease.  Stroke.  Heart attack.  Infertility.  Osteoporosis and bone fractures. Additionally, symptoms such as coughing, wheezing, and shortness of breath may get better when you quit. You may also find that you get sick less often because your body is stronger at fighting off colds and infections. If you are pregnant, quitting smoking can help to reduce your chances of having a baby of low birth weight. HOW DO I GET READY TO QUIT? When you decide to quit smoking, create a plan to make sure that you are successful. Before you quit:  Pick a date to quit. Set a date within the next two weeks to give you time to prepare.  Write down the reasons why you are quitting. Keep this list in places where you will see it often, such as on your bathroom mirror or in your car or wallet.  Identify the people, places, things, and activities that make you want to smoke (triggers) and avoid them. Make sure to take these actions:  Throw away all cigarettes at home, at work, and in your car.  Throw away smoking accessories, such as Scientist, research (medical).  Clean your car and make sure to empty the  ashtray.  Clean your home, including curtains and carpets.  Tell your family, friends, and coworkers that you are quitting. Support from your loved ones can make quitting easier.  Talk with your health care provider about your options for quitting smoking.  Find out what treatment options are covered by your health insurance. WHAT STRATEGIES CAN I USE TO QUIT SMOKING?  Talk with your healthcare provider about different strategies to quit smoking. Some strategies include:  Quitting smoking altogether instead of gradually lessening how much you smoke over a period of time. Research shows that quitting "cold [place]" is more successful than gradually quitting.  Attending in-person counseling to help you build problem-solving skills. You are more likely to have success in quitting if you attend several counseling sessions. Even short sessions of 10 minutes can be effective.  Finding resources and support systems that can help you to quit smoking and remain smoke-free after you quit. These resources are most helpful when you use them often. They can include:  Online chats with a Social worker.  Telephone quitlines.  Printed Furniture conservator/restorer.  Support groups or group counseling.  Text messaging programs.  Mobile phone applications.  Taking medicines to help you quit smoking. (If you are pregnant or breastfeeding, talk with your health care provider first.) Some medicines contain nicotine and some do not. Both types of medicines help with cravings, but the medicines that include nicotine help to relieve withdrawal symptoms. Your health care provider may recommend:  Nicotine patches,  gum, or lozenges.  Nicotine inhalers or sprays.  Non-nicotine medicine that is taken by mouth. Talk with your health care provider about combining strategies, such as taking medicines while you are also receiving in-person counseling. Using these two strategies together makes you more likely to succeed in  quitting than if you used either strategy on its own. If you are pregnant or breastfeeding, talk with your health care provider about finding counseling or other support strategies to quit smoking. Do not take medicine to help you quit smoking unless told to do so by your health care provider. WHAT THINGS CAN I DO TO MAKE IT EASIER TO QUIT? Quitting smoking might feel overwhelming at first, but there is a lot that you can do to make it easier. Take these important actions:  Reach out to your family and friends and ask that they support and encourage you during this time. Call telephone quitlines, reach out to support groups, or work with a counselor for support.  Ask people who smoke to avoid smoking around you.  Avoid places that trigger you to smoke, such as bars, parties, or smoke-break areas at work.  Spend time around people who do not smoke.  Lessen stress in your life, because stress can be a smoking trigger for some people. To lessen stress, try:  Exercising regularly.  Deep-breathing exercises.  Yoga.  Meditating.  Performing a body scan. This involves closing your eyes, scanning your body from head to toe, and noticing which parts of your body are particularly tense. Purposefully relax the muscles in those areas.  Download or purchase mobile phone or tablet apps (applications) that can help you stick to your quit plan by providing reminders, tips, and encouragement. There are many free apps, such as QuitGuide from the [employer] Office manager for Disease Control and Prevention). You can find other support for quitting smoking (smoking cessation) through smokefree.gov and other websites. HOW WILL I FEEL WHEN I QUIT SMOKING? Within the first 24 hours of quitting smoking, you may start to feel some withdrawal symptoms. These symptoms are usually most noticeable 2-3 days after quitting, but they usually do not last beyond 2-3 weeks. Changes or symptoms that you might experience include:  Mood  swings.  Restlessness, anxiety, or irritation.  Difficulty concentrating.  Dizziness.  Strong cravings for sugary foods in addition to nicotine.  Mild weight gain.  Constipation.  Nausea.  Coughing or a sore throat.  Changes in how your medicines work in your body.  A depressed mood.  Difficulty sleeping (insomnia). After the first 2-3 weeks of quitting, you may start to notice more positive results, such as:  Improved sense of smell and taste.  Decreased coughing and sore throat.  Slower heart rate.  Lower blood pressure.  Clearer skin.  The ability to breathe more easily.  Fewer sick days. Quitting smoking is very challenging for most people. Do not get discouraged if you are not successful the first time. Some people need to make many attempts to quit before they achieve long-term success. Do your best to stick to your quit plan, and talk with your health care provider if you have any questions or concerns.   This information is not intended to replace advice given to you by your health care provider. Make sure you discuss any questions you have with your health care provider.   Document Released: 11/08/2001 Document Revised: 03/31/2015 Document Reviewed: 03/31/2015 Elsevier Interactive Patient Education 2016 [employer]. Smoking Hazards Smoking cigarettes is extremely bad for your  health. Tobacco smoke has over 200 known poisons in it. It contains the poisonous gases nitrogen oxide and carbon monoxide. There are over 60 chemicals in tobacco smoke that cause cancer. Some of the chemicals found in cigarette smoke include:   Cyanide.   Benzene.   Formaldehyde.   Methanol (wood alcohol).   Acetylene (fuel used in welding torches).   Ammonia.  Even smoking lightly shortens your life expectancy by several years. You can greatly reduce the risk of medical problems for you and your family by stopping now. Smoking is the most preventable cause of death and  disease in our society. Within days of quitting smoking, your circulation improves, you decrease the risk of having a heart attack, and your lung capacity improves. There may be some increased phlegm in the first few days after quitting, and it may take months for your lungs to clear up completely. Quitting for 10 years reduces your risk of developing lung cancer to almost that of a nonsmoker.  WHAT ARE THE RISKS OF SMOKING? Cigarette smokers have an increased risk of many serious medical problems, including:  Lung cancer.   Lung disease (such as pneumonia, bronchitis, and emphysema).   Heart attack and chest pain due to the heart not getting enough oxygen (angina).   Heart disease and peripheral blood vessel disease.   Hypertension.   Stroke.   Oral cancer (cancer of the lip, mouth, or voice box).   Bladder cancer.   Pancreatic cancer.   Cervical cancer.   Pregnancy complications, including premature birth.   Stillbirths and smaller newborn babies, birth defects, and genetic damage to sperm.   Early menopause.   Lower estrogen level for women.   Infertility.   Facial wrinkles.   Blindness.   Increased risk of broken bones (fractures).   Senile dementia.   Stomach ulcers and internal bleeding.   Delayed wound healing and increased risk of complications during surgery. Because of secondhand smoke exposure, children of smokers have an increased risk of the following:   Sudden infant death syndrome (SIDS).   Respiratory infections.   Lung cancer.   Heart disease.   Ear infections.  WHY IS SMOKING ADDICTIVE? Nicotine is the chemical agent in tobacco that is capable of causing addiction or dependence. When you smoke and inhale, nicotine is absorbed rapidly into the bloodstream through your lungs. Both inhaled and noninhaled nicotine may be addictive.  WHAT ARE THE BENEFITS OF QUITTING?  There are many health benefits to quitting smoking.  Some are:   The likelihood of developing cancer and heart disease decreases. Health improvements are seen almost immediately.   Blood pressure, pulse rate, and breathing patterns start returning to normal soon after quitting.   People who quit may see an improvement in their overall quality of life.  HOW DO YOU QUIT SMOKING? Smoking is an addiction with both physical and psychological effects, and longtime habits can be hard to change. Your health care provider can recommend:  Programs and community resources, which may include group support, education, or therapy.  Replacement products, such as patches, gum, and nasal sprays. Use these products only as directed. Do not replace cigarette smoking with electronic cigarettes (commonly called e-cigarettes). The safety of e-cigarettes is unknown, and some may contain harmful chemicals. FOR MORE INFORMATION  American Lung Association: www.lung.org  American Cancer Society: www.cancer.org   This information is not intended to replace advice given to you by your health care provider. Make sure you discuss any questions you have  with your health care provider.   Document Released: 12/22/2004 Document Revised: 09/04/2013 Document Reviewed: 05/06/2013 Elsevier Interactive Patient Education 2016 [place] WHAT IS SECONDHAND SMOKE? Secondhand smoke is smoke that comes from burning tobacco. It could be the smoke from a cigarette, a pipe, or a cigar. Even if you are not the one smoking, secondhand smoke exposes you to the dangers of smoking. This is called involuntary, or passive, smoking. There are two types of secondhand smoke:  Sidestream smoke is the smoke that comes off the lighted end of a cigarette, pipe, or cigar.  This type of smoke has the highest amount of cancer-causing agents (carcinogens).  The particles in sidestream smoke are smaller. They get into your lungs more easily.  Mainstream smoke is the smoke that  is exhaled by a person who is smoking.  This type of smoke is also dangerous to your health. HOW CAN SECONDHAND SMOKE AFFECT MY HEALTH? Studies show that there is no safe level of secondhand smoke. This smoke contains thousands of chemicals. At least 39 of them are known to cause cancer. Secondhand smoke can also cause many other health problems. It has been linked to:  Lung cancer.  Cancer of the voice box (larynx) or throat.  Cancer of the sinuses.  Brain cancer.  Bladder cancer.  Stomach cancer.  Breast cancer.  White blood cell cancers (lymphoma and leukemia).  Brain and liver tumors in children.  Heart disease and stroke in adults.  Pregnancy loss (miscarriage).  Diseases in children, such as:  Asthma.  Lung infections.  Ear infections.  Sudden infant death syndrome (SIDS).  Slow growth. WHERE CAN I BE AT RISK FOR EXPOSURE TO SECONDHAND SMOKE?   For adults, the workplace is the main source of exposure to secondhand smoke.  Your workplace should have a policy separating smoking areas from nonsmoking areas.  Smoking areas should have a system for ventilating and cleaning the air.  For children, the home may be the most dangerous place for exposure to secondhand smoke.  Children who live in apartment buildings may be at risk from smoke drifting from hallways or other people's homes.  For everyone, many public places are possible sources of exposure to secondhand smoke.  These places include restaurants, shopping centers, and parks. HOW CAN I REDUCE MY RISK FOR EXPOSURE TO SECONDHAND SMOKE? The most important thing you can do is not smoke. Discourage family members from smoking. Other ways to reduce exposure for you and your family include the following:  Keep your home smoke free.  Make sure your child care providers do not smoke.  Warn your child about the dangers of smoking and secondhand smoke.  Do not allow smoking in your car. When someone smokes  in a car, all the damaging chemicals from the smoke are confined in a small area.  Avoid public places where smoking is allowed.   This information is not intended to replace advice given to you by your health care provider. Make sure you discuss any questions you have with your health care provider.   Document Released: 12/22/2004 Document Revised: 12/05/2014 Document Reviewed: 02/28/2014 Elsevier Interactive Patient Education [employer].

## 2015-12-16 ENCOUNTER — Emergency Department
Admission: EM | Admit: 2015-12-16 | Discharge: 2015-12-16 | Disposition: A | Discharge status: Home / Self Care | Payer: Medicaid Other | Attending: Emergency Medicine | Admitting: Emergency Medicine

## 2015-12-16 ENCOUNTER — Emergency Department: Payer: Medicaid Other

## 2015-12-16 ENCOUNTER — Encounter: Payer: Self-pay | Admitting: *Deleted

## 2015-12-16 DIAGNOSIS — R2242 Localized swelling, mass and lump, left lower limb: Secondary | ICD-10-CM | POA: Diagnosis present

## 2015-12-16 DIAGNOSIS — I1310 Hypertensive heart and chronic kidney disease without heart failure, with stage 1 through stage 4 chronic kidney disease, or unspecified chronic kidney disease: Secondary | ICD-10-CM | POA: Insufficient documentation

## 2015-12-16 DIAGNOSIS — N184 Chronic kidney disease, stage 4 (severe): Secondary | ICD-10-CM | POA: Diagnosis not present

## 2015-12-16 DIAGNOSIS — D649 Anemia, unspecified: Secondary | ICD-10-CM | POA: Insufficient documentation

## 2015-12-16 DIAGNOSIS — Z79899 Other long term (current) drug therapy: Secondary | ICD-10-CM | POA: Diagnosis not present

## 2015-12-16 DIAGNOSIS — Z7902 Long term (current) use of antithrombotics/antiplatelets: Secondary | ICD-10-CM | POA: Insufficient documentation

## 2015-12-16 DIAGNOSIS — Z7982 Long term (current) use of aspirin: Secondary | ICD-10-CM | POA: Diagnosis not present

## 2015-12-16 DIAGNOSIS — M7122 Synovial cyst of popliteal space [Baker], left knee: Secondary | ICD-10-CM | POA: Diagnosis not present

## 2015-12-16 DIAGNOSIS — F17210 Nicotine dependence, cigarettes, uncomplicated: Secondary | ICD-10-CM | POA: Diagnosis not present

## 2015-12-16 DIAGNOSIS — Z794 Long term (current) use of insulin: Secondary | ICD-10-CM | POA: Insufficient documentation

## 2015-12-16 DIAGNOSIS — E119 Type 2 diabetes mellitus without complications: Secondary | ICD-10-CM | POA: Diagnosis not present

## 2015-12-16 DIAGNOSIS — M79605 Pain in left leg: Secondary | ICD-10-CM

## 2015-12-16 LAB — URINALYSIS COMPLETE WITH MICROSCOPIC (ARMC ONLY)
BILIRUBIN URINE: NEGATIVE
Bacteria, UA: NONE SEEN
GLUCOSE, UA: 150 mg/dL — AB
HGB URINE DIPSTICK: NEGATIVE
KETONES UR: NEGATIVE mg/dL
NITRITE: NEGATIVE
PH: 6 (ref 5.0–8.0)
Protein, ur: 30 mg/dL — AB
SPECIFIC GRAVITY, URINE: 1.013 (ref 1.005–1.030)

## 2015-12-16 LAB — CBC
HEMATOCRIT: 27.4 % — AB (ref 35.0–47.0)
Hemoglobin: 8.4 g/dL — ABNORMAL LOW (ref 12.0–16.0)
MCH: 25.3 pg — ABNORMAL LOW (ref 26.0–34.0)
MCHC: 30.7 g/dL — AB (ref 32.0–36.0)
MCV: 82.3 fL (ref 80.0–100.0)
Platelets: 336 10*3/uL (ref 150–440)
RBC: 3.33 MIL/uL — ABNORMAL LOW (ref 3.80–5.20)
RDW: 19.2 % — AB (ref 11.5–14.5)
WBC: 7.4 10*3/uL (ref 3.6–11.0)

## 2015-12-16 LAB — BASIC METABOLIC PANEL
ANION GAP: 9 (ref 5–15)
BUN: 25 mg/dL — AB (ref 6–20)
CALCIUM: 9.5 mg/dL (ref 8.9–10.3)
CO2: 24 mmol/L (ref 22–32)
Chloride: 108 mmol/L (ref 101–111)
Creatinine, Ser: 1.65 mg/dL — ABNORMAL HIGH (ref 0.44–1.00)
GFR calc Af Amer: 37 mL/min — ABNORMAL LOW (ref >60)
GFR, EST NON AFRICAN AMERICAN: 32 mL/min — AB (ref >60)
GLUCOSE: 223 mg/dL — AB (ref 65–99)
POTASSIUM: 4.7 mmol/L (ref 3.5–5.1)
SODIUM: 141 mmol/L (ref 135–145)

## 2015-12-16 NOTE — ED Notes (Signed)
Lab called and reported troponin 0.04, charge ns aware and will place in room as soon as available.  Pt skin w/d,  NAD

## 2015-12-16 NOTE — Discharge Instructions (Signed)
Ultrasound did not attacks a blood clot in her left leg. It did see a Baker cyst. Your hemoglobin level is low. Follow-up with your primary physician for reevaluation of this and ongoing care. Return to the emergency department if you have worsening symptoms or other urgent concerns.  Baker Cyst A Baker cyst is a sac-like structure that forms in the back of the knee. It is filled with the same fluid that is located in your knee. This fluid lubricates the bones and cartilage of the knee and allows them to move over each other more easily. CAUSES  When the knee becomes injured or inflamed, increased fluid forms in the knee. When this happens, the joint lining is pushed out behind the knee and forms the Baker cyst. This cyst may also be caused by inflammation from arthritic conditions and infections. SIGNS AND SYMPTOMS  A Baker cyst usually has no symptoms. When the cyst is substantially enlarged:  You may feel pressure behind the knee, stiffness in the knee, or a mass in the area behind the knee.  You may develop pain, redness, and swelling in the calf. This can suggest a blood clot and requires evaluation by your health care provider. DIAGNOSIS  A Baker cyst is most often found during an ultrasound exam. This exam may have been performed for other reasons, and the cyst was found incidentally. Sometimes an MRI is used. This picks up other problems within a joint that an ultrasound exam may not. If the Baker cyst developed immediately after an injury, X-ray exams may be used to diagnose the cyst. TREATMENT  The treatment depends on the cause of the cyst. Anti-inflammatory medicines and rest often will be prescribed. If the cyst is caused by a bacterial infection, antibiotic medicines may be prescribed.  HOME CARE INSTRUCTIONS   If the cyst was caused by an injury, for the first 24 hours, keep the injured leg elevated on 2 pillows while lying down.  For the first 24 hours while you are awake,  apply ice to the injured area:  Put ice in a plastic bag.  Place a towel between your skin and the bag.  Leave the ice on for 20 minutes, 2-3 times a day.  Only take over-the-counter or prescription medicines for pain, discomfort, or fever as directed by your health care provider.  Only take antibiotic medicine as directed. Make sure to finish it even if you start to feel better. MAKE SURE YOU:   Understand these instructions.  Will watch your condition.  Will get help right away if you are not doing well or get worse.   This information is not intended to replace advice given to you by your health care provider. Make sure you discuss any questions you have with your health care provider.   Document Released: 11/14/2005 Document Revised: 09/04/2013 Document Reviewed: 06/26/2013 Elsevier Interactive Patient Education 2016 Else[employer]  Anemia, Nonspecific Anemia is a condition in which the concentration of red blood cells or hemoglobin in the blood is below normal. Hemoglobin is a substance in red blood cells that carries oxygen to the tissues of the body. Anemia results in not enough oxygen reaching these tissues.  CAUSES  Common causes of anemia include:   Excessive bleeding. Bleeding may be internal or external. This includes excessive bleeding from periods (in women) or from the intestine.   Poor nutrition.   Chronic kidney, thyroid, and liver disease.  Bone marrow disorders that decrease red blood cell production.  Cancer and  treatments for cancer.  HIV, AIDS, and their treatments.  Spleen problems that increase red blood cell destruction.  Blood disorders.  Excess destruction of red blood cells due to infection, medicines, and autoimmune disorders. SIGNS AND SYMPTOMS   Minor weakness.   Dizziness.   Headache.  Palpitations.   Shortness of breath, especially with exercise.   Paleness.  Cold sensitivity.  Indigestion.  Nausea.  Difficulty  sleeping.  Difficulty concentrating. Symptoms may occur suddenly or they may develop slowly.  DIAGNOSIS  Additional blood tests are often needed. These help your health care provider determine the best treatment. Your health care provider will check your stool for blood and look for other causes of blood loss.  TREATMENT  Treatment varies depending on the cause of the anemia. Treatment can include:   Supplements of iron, vitamin 123456, or folic acid.   Hormone medicines.   A blood transfusion. This may be needed if blood loss is severe.   Hospitalization. This may be needed if there is significant continual blood loss.   Dietary changes.  Spleen removal. HOME CARE INSTRUCTIONS Keep all follow-up appointments. It often takes many weeks to correct anemia, and having your health care provider check on your condition and your response to treatment is very important. SEEK IMMEDIATE MEDICAL CARE IF:   You develop extreme weakness, shortness of breath, or chest pain.   You become dizzy or have trouble concentrating.  You develop heavy vaginal bleeding.   You develop a rash.   You have bloody or black, tarry stools.   You faint.   You vomit up blood.   You vomit repeatedly.   You have abdominal pain.  You have a fever or persistent symptoms for more than 2-3 days.   You have a fever and your symptoms suddenly get worse.   You are dehydrated.  MAKE SURE YOU:  Understand these instructions.  Will watch your condition.  Will get help right away if you are not doing well or get worse.   This information is not intended to replace advice given to you by your health care provider. Make sure you discuss any questions you have with your health care provider.   Document Released: 12/22/2004 Document Revised: 07/17/2013 Document Reviewed: 05/10/2013 Elsevier Interactive Patient Education [employer].

## 2015-12-16 NOTE — ED Provider Notes (Signed)
[facility] Emergency Department Provider Note  ____________________________________________  Time seen: 1520  I have reviewed the triage vital signs and the nursing notes.  History by:  Patient  HISTORY  Chief Complaint Abnormal Lab and Leg Swelling     HPI Caitlin Bass is a 65 y.o. female presents to the emergency department after having been called by her primary physician with some abnormal blood tests and discussing with them the swelling she sat in her left leg.  The patient had gone to her primary physician, [facility], approximately 9 days ago. At that time they drew some blood test. She went back at the end of last week and additional blood tests were done. The patient tells me she has an elevated triglyceride level. She also reports that her 32 office that she might have pancreatitis. Patient denies any abdominal pain, nausea, vomiting, or diarrhea.   The patient has had discomfort and swelling in her left leg. This is been present for proximally one month. She reports it is better today than it was earlier in the week. When she mentioned her concerns for the swollen leg on the telephone today, she was told to come to the emergency department to be evaluated for blood clot.   Past Medical History  Diagnosis Date  . Coronary artery disease     a. 03/2014 NSTEMI/Cath: LM nl, LAD 69m, 60d, D1 60, D2 nl, d3 40, LCX 100 w/ L->L collats, RCA non dom, min irregs, EF 55%-->Med Rx.  . Diabetes mellitus without complication ([place])   . Hypertensive heart disease     a. 03/2015 Echo: 55-60%, mild conc LVH.  . CKD (chronic kidney disease), stage IV ([place])   . GERD (gastroesophageal reflux disease)   . DDD (degenerative disc disease)     a. s/p upper back surgery spring 2015.  Marland Kitchen Arthritis   . Hiatal hernia   . Hyperlipidemia   . Syncope and collapse     a. Early 03/2014->did not seek medical attn.  . Anxiety   . Fibromyalgia   . Anemia   .  Diverticulosis   . Tobacco abuse     Patient Active Problem List   Diagnosis Date Noted  . Hypertensive heart disease   . Tobacco abuse   . Hyperlipidemia   . Coronary artery disease   . Diabetes mellitus without complication ([place])   . Hypertension   . CKD (chronic kidney disease), stage IV ([place])   . GERD (gastroesophageal reflux disease)   . DDD (degenerative disc disease)     Past Surgical History  Procedure Laterality Date  . Neck surgery      Degenerative Disk Disease and removal of a spinal cyst  . Cardiac catheterization  06/2005    [facility]  . Cardiac catheterization  04/2014    [facility]  . Tubal ligation    . Breast cyst aspiration Right 2005    NEG  . Coronary angioplasty    . Dilatation & curettage/hysteroscopy with myosure N/A 09/18/2015    Procedure: DILATATION & CURETTAGE/HYSTEROSCOPY WITH MYOSURE;  Surgeon: Boykin Nearing, MD;  Location: [facility] ORS;  Service: Gynecology;  Laterality: N/A;    Current Outpatient Rx  Name  Route  Sig  Dispense  Refill  . allopurinol (ZYLOPRIM) 100 MG tablet   Oral   Take 100 mg by mouth 2 (two) times daily.         Marland Kitchen amLODipine (NORVASC) 5 MG tablet   Oral   Take 1  tablet (5 mg total) by mouth daily.   30 tablet   3   . aspirin 81 MG chewable tablet   Oral   Chew 81 mg by mouth daily.         Marland Kitchen atorvastatin (LIPITOR) 40 MG tablet   Oral   Take 40 mg by mouth daily at 6 PM.          . cholecalciferol (VITAMIN D) 1000 UNITS tablet   Oral   Take 2,000 Units by mouth 2 (two) times daily.          . clopidogrel (PLAVIX) 75 MG tablet   Oral   Take 1 tablet (75 mg total) by mouth daily.   30 tablet   11   . cyclobenzaprine (FLEXERIL) 5 MG tablet   Oral   Take 5 mg by mouth as needed for muscle spasms.         Marland Kitchen HYDROcodone-acetaminophen (NORCO) 5-325 MG tablet   Oral   Take 2 tablets by mouth every 6 (six) hours as needed for severe pain (take 1 tab every 6 hours for moderate pain - PLEASE TAKE WITH  COLACE/STOOL SOFTENER TO PREVENT CONSTIPATION).   45 tablet   0   . insulin glargine (LANTUS) 100 UNIT/ML injection   Subcutaneous   Inject 35 Units into the skin every morning. 44 units at bedtime         . Insulin Lispro, Human, (HUMALOG KWIKPEN [place])   Subcutaneous   Inject 8 Units into the skin every morning. 28 units at lunch and supper         . lisinopril (PRINIVIL,ZESTRIL) 5 MG tablet   Oral   Take 5 mg by mouth at bedtime.         . magnesium oxide (MAG-OX) 400 MG tablet   Oral   Take 400 mg by mouth 2 (two) times daily.         . meloxicam (MOBIC) 15 MG tablet   Oral   Take 15 mg by mouth daily.         . metoprolol tartrate (LOPRESSOR) 25 MG tablet   Oral   Take 1 tablet (25 mg total) by mouth 2 (two) times daily.   60 tablet   3   . nitroGLYCERIN (NITROSTAT) 0.4 MG SL tablet   Sublingual   Place 1 tablet (0.4 mg total) under the tongue every 5 (five) minutes as needed for chest pain.   25 tablet   1   . ranitidine (ZANTAC) 150 MG tablet   Oral   Take 150 mg by mouth 2 (two) times daily.         Marland Kitchen triamcinolone cream (KENALOG) 0.1 %   Topical   Apply 1 application topically 2 (two) times daily.           Allergies Aspirin and Hydrochlorothiazide  Family History  Problem Relation Age of Onset  . Heart disease Mother   . Heart attack Mother   . Hypertension Mother   . Hyperlipidemia Mother   . Breast cancer Sister     Social History Social History  Substance Use Topics  . Smoking status: Current Every Day Smoker -- 0.25 packs/day for 40 years    Types: Cigarettes  . Smokeless tobacco: Never Used  . Alcohol Use: No    Review of Systems  Constitutional: Negative for fever/chills. ENT: Negative for congestion. Cardiovascular: Negative for chest pain. Respiratory: Negative for cough. Gastrointestinal: Negative for abdominal pain, vomiting and diarrhea.  Genitourinary: Negative for dysuria. Musculoskeletal: Some swelling with  discomfort in the left leg. History of sciatica.. Skin: Negative for rash. Neurological: Negative for headache or focal weakness   10-point ROS otherwise negative.  ____________________________________________   PHYSICAL EXAM:  VITAL SIGNS: ED Triage Vitals  Enc Vitals Group     BP 12/16/15 1133 139/90 mmHg     Pulse Rate 12/16/15 1133 84     Resp 12/16/15 1133 18     Temp 12/16/15 1133 98.1 F (36.7 C)     Temp Source 12/16/15 1133 Oral     SpO2 12/16/15 1133 100 %     Weight 12/16/15 1133 165 lb (74.844 kg)     Height 12/16/15 1133 5\' 5"  (1.651 m)     Head Cir --      Peak Flow --      Pain Score 12/16/15 1133 5     Pain Loc --      Pain Edu? --      Excl. in [place]? --     Constitutional: Alert and oriented. Well appearing and in no distress. ENT   Head: Normocephalic and atraumatic.   Nose: No congestion/rhinnorhea.       Mouth: No erythema, no swelling   Cardiovascular: Normal rate, regular rhythm, no murmur noted Respiratory:  Normal respiratory effort, no tachypnea.    Breath sounds are clear and equal bilaterally.  Gastrointestinal: Soft, no distention. Nontender Back: No muscle spasm, no tenderness, no CVA tenderness. Musculoskeletal: No deformity noted. Nontender with normal range of motion in all extremities.  There is a subtle loss of definition in the left foot due to some mild swelling. No pitting edema.  Neurologic:  Communicative. Normal appearing spontaneous movement in all 4 extremities. No gross focal neurologic deficits are appreciated.  Skin:  Skin is warm, dry. No rash noted. Psychiatric: Mood and affect are normal. Speech and behavior are normal.  ____________________________________________    LABS (pertinent positives/negatives)  Labs Reviewed  BASIC METABOLIC PANEL - Abnormal; Notable for the following:    Glucose, Bld 223 (*)    BUN 25 (*)    Creatinine, Ser 1.65 (*)    GFR calc non Af Amer 32 (*)    GFR calc Af Amer 37 (*)    All  other components within normal limits  CBC - Abnormal; Notable for the following:    RBC 3.33 (*)    Hemoglobin 8.4 (*)    HCT 27.4 (*)    MCH 25.3 (*)    MCHC 30.7 (*)    RDW 19.2 (*)    All other components within normal limits  URINALYSIS COMPLETEWITH MICROSCOPIC ([facility] ONLY) - Abnormal; Notable for the following:    Color, Urine STRAW (*)    APPearance HAZY (*)    Glucose, UA 150 (*)    Protein, ur 30 (*)    Leukocytes, UA 1+ (*)    Squamous Epithelial / LPF 6-30 (*)    All other components within normal limits     ____________________________________________   EKG  ED ECG REPORT I, Neriyah Cercone W, the attending physician, personally viewed and interpreted this ECG.   Date: 12/16/2015  EKG Time: 1137  Rate: 82  Rhythm:  Normal sinus rhythm  Axis: Left axis deviation  Intervals: Normal  ST&T Change: Downward T-wave in aVL, minimal ST elevation in V2 V3 with normal morphology.   ____________________________________________    RADIOLOGY   Doppler ultrasound, left leg   IMPRESSION: 1. No evidence of  deep venous thrombosis in the left lower extremity. 2. Probable moderate Baker's cyst in the left popliteal fossa. ____________________________________________   PROCEDURES ____________________________________________   INITIAL IMPRESSION / ASSESSMENT AND PLAN / ED COURSE  Pertinent labs & imaging results that were available during my care of the patient were reviewed by me and considered in my medical decision making (see chart for details).  65 year old female who appears well and reports she is feeling well today. She is here because of the concern for an abnormal blood test as well as for left leg swelling. We note that her hemoglobin is low. We will attempt to find a comparison. She denies any dark or black stools. We will obtain an ultrasound of the left leg that is slightly swollen and has been  uncomfortable.  ----------------------------------------- 5:11 PM on 12/16/2015 -----------------------------------------  Ultrasound is negative for DVT. There is a probable moderate Baker's cyst in the left popliteal fossa.  [facility] have called [facility]. They report she has not a patient there. They report she is a patient at [facility] clinic. We are unable to reach [place] [facility].  Hemoglobin was noted at 8.4. I discussed this with the patient and asked her to follow-up with her primary physician for serial hemoglobins. ____________________________________________   FINAL CLINICAL IMPRESSION(S) / ED DIAGNOSES  Final diagnoses:  Left leg pain  Baker's cyst, left  Anemia, unspecified anemia type        Ahmed Prima, MD 12/16/15 2302

## 2015-12-16 NOTE — ED Notes (Signed)
Patient states she only has pain when ambulating and then her pain scale is 10/10.

## 2015-12-16 NOTE — ED Notes (Addendum)
Pt states she was sent here form her PCP for abnormal labs but pt can not recall which labs, also states left leg swelling and redness, no redness or swelling noted upon assessment, also states trouble with her throat and that she coughs alot

## 2015-12-21 DIAGNOSIS — R011 Cardiac murmur, unspecified: Secondary | ICD-10-CM | POA: Insufficient documentation

## 2016-02-12 ENCOUNTER — Other Ambulatory Visit: Payer: Self-pay | Admitting: *Deleted

## 2016-02-12 DIAGNOSIS — D8989 Other specified disorders involving the immune mechanism, not elsewhere classified: Secondary | ICD-10-CM

## 2016-02-15 ENCOUNTER — Inpatient Hospital Stay: Payer: Medicaid Other | Admitting: Internal Medicine

## 2016-02-15 ENCOUNTER — Inpatient Hospital Stay: Payer: Medicaid Other

## 2016-02-20 ENCOUNTER — Encounter: Payer: Self-pay | Admitting: Emergency Medicine

## 2016-02-20 ENCOUNTER — Emergency Department
Admission: EM | Admit: 2016-02-20 | Discharge: 2016-02-20 | Disposition: A | Discharge status: Home / Self Care | Payer: Medicaid Other | Attending: Emergency Medicine | Admitting: Emergency Medicine

## 2016-02-20 DIAGNOSIS — I119 Hypertensive heart disease without heart failure: Secondary | ICD-10-CM | POA: Diagnosis not present

## 2016-02-20 DIAGNOSIS — Z791 Long term (current) use of non-steroidal anti-inflammatories (NSAID): Secondary | ICD-10-CM | POA: Diagnosis not present

## 2016-02-20 DIAGNOSIS — N184 Chronic kidney disease, stage 4 (severe): Secondary | ICD-10-CM | POA: Insufficient documentation

## 2016-02-20 DIAGNOSIS — F17210 Nicotine dependence, cigarettes, uncomplicated: Secondary | ICD-10-CM | POA: Diagnosis not present

## 2016-02-20 DIAGNOSIS — Z9861 Coronary angioplasty status: Secondary | ICD-10-CM | POA: Diagnosis not present

## 2016-02-20 DIAGNOSIS — E119 Type 2 diabetes mellitus without complications: Secondary | ICD-10-CM | POA: Insufficient documentation

## 2016-02-20 DIAGNOSIS — Z79899 Other long term (current) drug therapy: Secondary | ICD-10-CM | POA: Diagnosis not present

## 2016-02-20 DIAGNOSIS — Z794 Long term (current) use of insulin: Secondary | ICD-10-CM | POA: Insufficient documentation

## 2016-02-20 DIAGNOSIS — I129 Hypertensive chronic kidney disease with stage 1 through stage 4 chronic kidney disease, or unspecified chronic kidney disease: Secondary | ICD-10-CM | POA: Insufficient documentation

## 2016-02-20 DIAGNOSIS — K0889 Other specified disorders of teeth and supporting structures: Secondary | ICD-10-CM | POA: Diagnosis present

## 2016-02-20 DIAGNOSIS — E785 Hyperlipidemia, unspecified: Secondary | ICD-10-CM | POA: Insufficient documentation

## 2016-02-20 DIAGNOSIS — K047 Periapical abscess without sinus: Secondary | ICD-10-CM | POA: Insufficient documentation

## 2016-02-20 DIAGNOSIS — Z7982 Long term (current) use of aspirin: Secondary | ICD-10-CM | POA: Diagnosis not present

## 2016-02-20 DIAGNOSIS — I2510 Atherosclerotic heart disease of native coronary artery without angina pectoris: Secondary | ICD-10-CM | POA: Diagnosis not present

## 2016-02-20 MED ORDER — AMOXICILLIN 500 MG PO CAPS: 500.0000 mg | ORAL_CAPSULE | Freq: Three times a day (TID) | ORAL | Status: DC

## 2016-02-20 NOTE — ED Notes (Signed)
States abscess L lower dental x 1 day.

## 2016-02-20 NOTE — ED Provider Notes (Signed)
[facility] Emergency Department Provider Note  ____________________________________________  Time seen: Approximately 2:13 PM  I have reviewed the triage vital signs and the nursing notes.   HISTORY  Chief Complaint Dental Pain   HPI Caitlin Bass is a 65 y.o. female is here with complaint of left lower dental pain for one day. Patient states that she subjectively has had some fever but denies chills. She is denies any cough, congestion, nausea or vomiting. She states she has not taken any over-the-counter medication. Currently she does not have a dentist.Currently she rates her pain is 7 out of 10.   Past Medical History  Diagnosis Date  . Coronary artery disease     a. 03/2014 NSTEMI/Cath: LM nl, LAD 79m, 60d, D1 60, D2 nl, d3 40, LCX 100 w/ L->L collats, RCA non dom, min irregs, EF 55%-->Med Rx.  . Diabetes mellitus without complication ([place])   . Hypertensive heart disease     a. 03/2015 Echo: 55-60%, mild conc LVH.  . CKD (chronic kidney disease), stage IV ([place])   . GERD (gastroesophageal reflux disease)   . DDD (degenerative disc disease)     a. s/p upper back surgery spring 2015.  Marland Kitchen Arthritis   . Hiatal hernia   . Hyperlipidemia   . Syncope and collapse     a. Early 03/2014->did not seek medical attn.  . Anxiety   . Fibromyalgia   . Anemia   . Diverticulosis   . Tobacco abuse     Patient Active Problem List   Diagnosis Date Noted  . Hypertensive heart disease   . Tobacco abuse   . Hyperlipidemia   . Coronary artery disease   . Diabetes mellitus without complication ([place])   . Hypertension   . CKD (chronic kidney disease), stage IV ([place])   . GERD (gastroesophageal reflux disease)   . DDD (degenerative disc disease)     Past Surgical History  Procedure Laterality Date  . Neck surgery      Degenerative Disk Disease and removal of a spinal cyst  . Cardiac [facility]  06/[facility]  . Cardiac catheterization  04/2014    [facility]   . Tubal ligation    . Breast cyst aspiration Right 2005    NEG  . Coronary angioplasty    . Dilatation & curettage/hysteroscopy with myosure N/A 09/18/2015    Procedure: DILATATION & CURETTAGE/HYSTEROSCOPY WITH MYOSURE;  Surgeon: Boykin Nearing, MD;  Location: [facility] ORS;  Service: Gynecology;  Laterality: N/A;    Current Outpatient Rx  Name  Route  Sig  Dispense  Refill  . allopurinol (ZYLOPRIM) 100 MG tablet   Oral   Take 100 mg by mouth 2 (two) times daily.         Marland Kitchen amLODipine (NORVASC) 5 MG tablet   Oral   Take 1 tablet (5 mg total) by mouth daily.   30 tablet   3   . amoxicillin (AMOXIL) 500 MG capsule   Oral   Take 1 capsule (500 mg total) by mouth 3 (three) times daily.   30 capsule   0   . aspirin 81 MG chewable tablet   Oral   Chew 81 mg by mouth daily.         Marland Kitchen atorvastatin (LIPITOR) 40 MG tablet   Oral   Take 40 mg by mouth daily at 6 PM.          . cholecalciferol (VITAMIN D) 1000 UNITS tablet  Oral   Take 2,000 Units by mouth 2 (two) times daily.          . clopidogrel (PLAVIX) 75 MG tablet   Oral   Take 1 tablet (75 mg total) by mouth daily.   30 tablet   11   . cyclobenzaprine (FLEXERIL) 5 MG tablet   Oral   Take 5 mg by mouth as needed for muscle spasms.         Marland Kitchen HYDROcodone-acetaminophen (NORCO) 5-325 MG tablet   Oral   Take 2 tablets by mouth every 6 (six) hours as needed for severe pain (take 1 tab every 6 hours for moderate pain - PLEASE TAKE WITH COLACE/STOOL SOFTENER TO PREVENT CONSTIPATION).   45 tablet   0   . insulin glargine (LANTUS) 100 UNIT/ML injection   Subcutaneous   Inject 35 Units into the skin every morning. 44 units at bedtime         . Insulin Lispro, Human, (HUMALOG KWIKPEN [place])   Subcutaneous   Inject 8 Units into the skin every morning. 28 units at lunch and supper         . lisinopril (PRINIVIL,ZESTRIL) 5 MG tablet   Oral   Take 5 mg by mouth at bedtime.         . magnesium oxide  (MAG-OX) 400 MG tablet   Oral   Take 400 mg by mouth 2 (two) times daily.         . meloxicam (MOBIC) 15 MG tablet   Oral   Take 15 mg by mouth daily.         . metoprolol tartrate (LOPRESSOR) 25 MG tablet   Oral   Take 1 tablet (25 mg total) by mouth 2 (two) times daily.   60 tablet   3   . nitroGLYCERIN (NITROSTAT) 0.4 MG SL tablet   Sublingual   Place 1 tablet (0.4 mg total) under the tongue every 5 (five) minutes as needed for chest pain.   25 tablet   1   . ranitidine (ZANTAC) 150 MG tablet   Oral   Take 150 mg by mouth 2 (two) times daily.         Marland Kitchen triamcinolone cream (KENALOG) 0.1 %   Topical   Apply 1 application topically 2 (two) times daily.           Allergies Aspirin and Hydrochlorothiazide  Family History  Problem Relation Age of Onset  . Heart disease Mother   . Heart attack Mother   . Hypertension Mother   . Hyperlipidemia Mother   . Breast cancer Sister     Social History Social History  Substance Use Topics  . Smoking status: Current Every Day Smoker -- 0.25 packs/day for 40 years    Types: Cigarettes  . Smokeless tobacco: Never Used  . Alcohol Use: No    Review of Systems Constitutional: No fever/chills ENT: No sore throat.Left lower dental pain positive. Cardiovascular: Denies chest pain. Respiratory: Denies shortness of breath. Gastrointestinal: No nausea, no vomiting.   Musculoskeletal: Negative for back pain. Skin: Negative for rash. Neurological: Negative for headaches, focal weakness or numbness.  10-point ROS otherwise negative.  ____________________________________________   PHYSICAL EXAM:  VITAL SIGNS: ED Triage Vitals  Enc Vitals Group     BP 02/20/16 1336 144/63 mmHg     Pulse Rate 02/20/16 1336 89     Resp 02/20/16 1336 20     Temp 02/20/16 1336 98.4 F (36.9 C)  Temp src --      SpO2 02/20/16 1336 99 %     Weight 02/20/16 1336 160 lb (72.576 kg)     Height 02/20/16 1336 5\' 5"  (1.651 m)     Head  Cir --      Peak Flow --      Pain Score 02/20/16 1337 7     Pain Loc --      Pain Edu? --      Excl. in [place]? --     Constitutional: Alert and oriented. Well appearing and in no acute distress. Eyes: Conjunctivae are normal. PERRL. EOMI. Head: Atraumatic. Nose: No congestion/rhinnorhea. Mouth/Throat: Mucous membranes are moist.  Oropharynx non-erythematous.Left lower gum edema with very poor dental hygiene and repair. Neck: No stridor.   Hematological/Lymphatic/Immunilogical: No cervical lymphadenopathy. Cardiovascular: Normal rate, regular rhythm. Grossly normal heart sounds.  Good peripheral circulation. Respiratory: Normal respiratory effort.  No retractions. Lungs CTAB. Gastrointestinal: Soft and nontender. No distention.  Musculoskeletal: Moves upper and lower extremities without any difficulty. Normal gait was noted. Neurologic:  Normal speech and language. No gross focal neurologic deficits are appreciated. No gait instability. Skin:  Skin is warm, dry and intact. No rash noted. Psychiatric: Mood and affect are normal. Speech and behavior are normal.  ____________________________________________   LABS (all labs ordered are listed, but only abnormal results are displayed)  Labs Reviewed - No data to display  PROCEDURES  Procedure(s) performed: None  Critical Care performed: No  ____________________________________________   INITIAL IMPRESSION / ASSESSMENT AND PLAN / ED COURSE  Pertinent labs & imaging results that were available during my care of the patient were reviewed by me and considered in my medical decision making (see chart for details).  Patient is given prescription for amoxicillin 5 mg 3 times a day for 10 days. She is to follow-up with a dentist and a list of dental clinics was written for her. She'll also take Tylenol as needed for pain ____________________________________________   FINAL CLINICAL IMPRESSION(S) / ED DIAGNOSES  Final diagnoses:   Dental abscess      Johnn Hai, PA-C 02/20/16 1444  Eula Listen, MD 02/20/16 1654

## 2016-02-20 NOTE — Discharge Instructions (Signed)
Dental Abscess A dental abscess is pus in or around a tooth. HOME CARE  Take medicines only as told by your dentist.  If you were prescribed antibiotic medicine, finish all of it even if you start to feel better.  Rinse your mouth (gargle) often with salt water.  Do not drive or use heavy machinery, like a lawn mower, while taking pain medicine.  Do not apply heat to the outside of your mouth.  Keep all follow-up visits as told by your dentist. This is important. GET HELP IF:  Your pain is worse, and medicine does not help. GET HELP RIGHT AWAY IF:  You have a fever or chills.  Your symptoms suddenly get worse.  You have a very bad headache.  You have problems breathing or swallowing.  You have trouble opening your mouth.  You have puffiness (swelling) in your neck or around your eye.   This information is not intended to replace advice given to you by your health care provider. Make sure you discuss any questions you have with your health care provider.   Document Released: 03/31/2015 Document Reviewed: 03/31/2015 Elsevier Interactive Patient Education [employer].   Call to make an appointment with the dentist. Begin taking antibiotics as directed. Call the below clinics that are listed for a dentist. [place]  [facility] and [place] [email or web address] [phone])  [facility] [phone])  [facility] [phone])  [facility] [phone])  [facility] [phone]) This clinic caters to the indigent population and is on a lottery system. Location: [facility], [place], [place], [place] Clinic Hours: Wednesdays from 6pm - 9pm, patients seen by a lottery system. For dates, call or go to  [email or web address] Services: Cleanings, fillings and simple extractions. Payment Options: DENTAL WORK IS FREE OF CHARGE. Bring proof of income or support. Best way to get seen: Arrive at 5:15 pm - this is a lottery, NOT first come/first serve, so arriving earlier will not increase your chances of being seen.     [facility] [phone] Select option 1 for emergencies   Location: [facility], [place], [address] [place] Clinic Hours: No walk-ins accepted - call the day before to schedule an appointment. Check in times are 9:30 am and 1:30 pm. Services: Simple extractions, temporary fillings, pulpectomy/pulp debridement, uncomplicated abscess drainage. Payment Options: PAYMENT IS DUE AT THE TIME OF SERVICE.  Fee is usually $100-200, additional surgical procedures (e.g. abscess drainage) may be extra. Cash, checks, Visa/MasterCard accepted.  Can file Medicaid if patient is covered for dental - patient should call case worker to check. No discount for [facility] patients. Best way to get seen: MUST call the day before and get onto the schedule. Can usually be seen the next 1-2 days. No walk-ins accepted.     [place] [phone]   Location: [place], [place] Clinic Hours: M, W, Th, F 8am or 1:30pm, Tues 9a or 1:30 - first come/first served. Services: Simple extractions, temporary fillings, uncomplicated abscess drainage.  You do not need to be an [facility] resident. Payment Options: PAYMENT IS DUE AT THE TIME OF SERVICE. Dental insurance, otherwise sliding scale - bring proof of income or support. Depending on income and treatment needed, cost is usually $50-200. Best  way to get seen: Arrive early as it is first come/first served.     [facility] [phone]   Location: [place] [facility] Hours: Mon-Thu 8a-5p Services: Most basic dental services including extractions and fillings. Payment Options: PAYMENT IS DUE AT THE TIME OF SERVICE. Sliding scale, up to 50% off - bring proof if income or support. Medicaid with dental option accepted. Best way to get seen: Call to schedule an appointment, can usually be seen within 2 weeks OR they will try to see walk-ins - show up at [place] or 2p (you may have to wait).     [facility] [place] RESIDENTS ONLY   Location: [facility], [place] [address], [place], [place] [address] Clinic Hours: By appointment only. Monday - Thursday 8am-5pm, Friday 8am-12pm Services: Cleanings, fillings, extractions. Payment Options: PAYMENT IS DUE AT THE TIME OF SERVICE. Cash, Visa or MasterCard. Sliding scale - $30 minimum per service. Best way to get seen: Come in to office, complete packet and make an appointment - need proof of income or support monies for each household member and proof of [facility] residence. Usually takes about a month to get in.     [facility] [phone]   Location: [address], [place] Clinic Hours: Walk-in Urgent Care Dental Services are offered Monday-Friday mornings only. The numbers of emergencies accepted daily is limited to the number of providers available. Maximum 15 - Mondays, Wednesdays & Thursdays Maximum 10 - Tuesdays & Fridays Services: You do not need to be a [facility] resident to be seen for a dental emergency. Emergencies are defined as pain, swelling, abnormal bleeding, or dental trauma. Walkins will receive x-rays if needed. NOTE: Dental cleaning is not an emergency. Payment Options: PAYMENT IS DUE AT THE TIME OF SERVICE. Minimum co-pay is $40.00 for uninsured patients. Minimum co-pay is $3.00 for Medicaid with dental coverage. Dental Insurance is accepted and must be presented at time of  visit. Medicare does not cover dental. Forms of payment: Cash, credit card, checks. Best way to get seen: If not previously registered with the clinic, walk-in dental registration begins at 7:15 am and is on a first come/first serve basis. If previously registered with the clinic, call to make an appointment.     [facility] [place] ONLY   Location: [address], [place], [place] Clinic Hours: Mon-Thu 10a-2p Services: Extractions only! Payment Options: FREE (donations accepted) - bring proof of income or support Best way to get seen: Call and schedule an appointment OR come at 8am on the 1st Monday of every month (except for holidays) when it is first come/first served.     [facility] [phone]   Location: [place], [place] Clinic Hours: Friday mornings Services, Payment Options, Best way to get seen: Call for info

## 2016-02-25 ENCOUNTER — Telehealth: Payer: Self-pay | Admitting: Cardiovascular Disease

## 2016-02-25 NOTE — Telephone Encounter (Signed)
S/w Elisabeth Cara, NP at [facility] who requests advice regarding holding Plavix prior to dental procedure. She states pt will have at least one tooth extraction. Reviewed Dr. Tyrell Antonio recommendations. States she will have pt continue 81mg  ASA. Elmyra Ricks verbalized understanding with no further questions.

## 2016-02-25 NOTE — Telephone Encounter (Signed)
It depends what kind of dental procedure. For routine non surgical procedures that don't require extraction, no need to hold Plavix. If otherwise, can hold plavix 1 week before.

## 2016-02-25 NOTE — Telephone Encounter (Signed)
Forward to Elberta Leatherwood, [facility] Pharmacist to advise

## 2016-02-25 NOTE — Telephone Encounter (Signed)
Routing Plavix clearance to Dr. Fletcher Anon.

## 2016-02-25 NOTE — Telephone Encounter (Signed)
Request for surgical clearance:  1. What type of surgery is being performed?  Dental procedure  2. When is this surgery scheduled? Hasn't been schedule  Are there any medications that need to be held prior to surgery and how long? How long to hold clopidogrel (PLAVIX) 75 MG tablet 3.   4. Name of physician performing surgery? Health dept.  5. What is your office phone and fax number?    Please call Elisabeth Cara, NP   [phone] with [facility]. Thanks

## 2016-02-26 ENCOUNTER — Inpatient Hospital Stay: Payer: Medicaid Other

## 2016-02-26 ENCOUNTER — Inpatient Hospital Stay: Payer: Medicaid Other | Attending: Internal Medicine | Admitting: Internal Medicine

## 2016-02-26 ENCOUNTER — Telehealth: Payer: Self-pay | Admitting: Internal Medicine

## 2016-02-26 VITALS — BP 124/69 | HR 76 | Temp 97.2°F | Resp 18 | Wt 161.6 lb

## 2016-02-26 DIAGNOSIS — E119 Type 2 diabetes mellitus without complications: Secondary | ICD-10-CM | POA: Diagnosis not present

## 2016-02-26 DIAGNOSIS — K449 Diaphragmatic hernia without obstruction or gangrene: Secondary | ICD-10-CM | POA: Diagnosis not present

## 2016-02-26 DIAGNOSIS — D649 Anemia, unspecified: Secondary | ICD-10-CM | POA: Diagnosis not present

## 2016-02-26 DIAGNOSIS — K219 Gastro-esophageal reflux disease without esophagitis: Secondary | ICD-10-CM | POA: Insufficient documentation

## 2016-02-26 DIAGNOSIS — Z79899 Other long term (current) drug therapy: Secondary | ICD-10-CM | POA: Insufficient documentation

## 2016-02-26 DIAGNOSIS — I252 Old myocardial infarction: Secondary | ICD-10-CM | POA: Insufficient documentation

## 2016-02-26 DIAGNOSIS — M797 Fibromyalgia: Secondary | ICD-10-CM | POA: Diagnosis not present

## 2016-02-26 DIAGNOSIS — I2510 Atherosclerotic heart disease of native coronary artery without angina pectoris: Secondary | ICD-10-CM | POA: Diagnosis not present

## 2016-02-26 DIAGNOSIS — M1990 Unspecified osteoarthritis, unspecified site: Secondary | ICD-10-CM | POA: Diagnosis not present

## 2016-02-26 DIAGNOSIS — D472 Monoclonal gammopathy: Secondary | ICD-10-CM | POA: Insufficient documentation

## 2016-02-26 DIAGNOSIS — F419 Anxiety disorder, unspecified: Secondary | ICD-10-CM | POA: Diagnosis not present

## 2016-02-26 DIAGNOSIS — I119 Hypertensive heart disease without heart failure: Secondary | ICD-10-CM | POA: Diagnosis not present

## 2016-02-26 DIAGNOSIS — D8989 Other specified disorders involving the immune mechanism, not elsewhere classified: Secondary | ICD-10-CM

## 2016-02-26 DIAGNOSIS — Z7982 Long term (current) use of aspirin: Secondary | ICD-10-CM | POA: Insufficient documentation

## 2016-02-26 DIAGNOSIS — N184 Chronic kidney disease, stage 4 (severe): Secondary | ICD-10-CM | POA: Diagnosis not present

## 2016-02-26 DIAGNOSIS — Z794 Long term (current) use of insulin: Secondary | ICD-10-CM | POA: Insufficient documentation

## 2016-02-26 DIAGNOSIS — F17210 Nicotine dependence, cigarettes, uncomplicated: Secondary | ICD-10-CM

## 2016-02-26 DIAGNOSIS — E785 Hyperlipidemia, unspecified: Secondary | ICD-10-CM | POA: Insufficient documentation

## 2016-02-26 DIAGNOSIS — D508 Other iron deficiency anemias: Secondary | ICD-10-CM | POA: Insufficient documentation

## 2016-02-26 LAB — IRON AND TIBC
IRON: 146 ug/dL (ref 28–170)
SATURATION RATIOS: 38 % — AB (ref 10.4–31.8)
TIBC: 384 ug/dL (ref 250–450)
UIBC: 238 ug/dL

## 2016-02-26 LAB — CBC WITH DIFFERENTIAL/PLATELET
Basophils Absolute: 0.1 10*3/uL (ref 0–0.1)
EOS ABS: 0.1 10*3/uL (ref 0–0.7)
Eosinophils Relative: 2 %
HCT: 27.4 % — ABNORMAL LOW (ref 35.0–47.0)
HEMOGLOBIN: 8.8 g/dL — AB (ref 12.0–16.0)
Lymphocytes Relative: 31 %
Lymphs Abs: 2.2 10*3/uL (ref 1.0–3.6)
MCH: 28.3 pg (ref 26.0–34.0)
MCHC: 32.3 g/dL (ref 32.0–36.0)
MCV: 87.8 fL (ref 80.0–100.0)
Monocytes Absolute: 0.6 10*3/uL (ref 0.2–0.9)
Monocytes Relative: 8 %
NEUTROS ABS: 4.2 10*3/uL (ref 1.4–6.5)
Neutrophils Relative %: 58 %
PLATELETS: 429 10*3/uL (ref 150–440)
RBC: 3.12 MIL/uL — AB (ref 3.80–5.20)
RDW: 20.2 % — ABNORMAL HIGH (ref 11.5–14.5)
WBC: 7.3 10*3/uL (ref 3.6–11.0)

## 2016-02-26 LAB — COMPREHENSIVE METABOLIC PANEL
ALBUMIN: 3.7 g/dL (ref 3.5–5.0)
ALK PHOS: 77 U/L (ref 38–126)
ALT: 22 U/L (ref 14–54)
AST: 24 U/L (ref 15–41)
Anion gap: 5 (ref 5–15)
BUN: 22 mg/dL — AB (ref 6–20)
CALCIUM: 9.6 mg/dL (ref 8.9–10.3)
CHLORIDE: 107 mmol/L (ref 101–111)
CO2: 23 mmol/L (ref 22–32)
CREATININE: 1.72 mg/dL — AB (ref 0.44–1.00)
GFR calc non Af Amer: 30 mL/min — ABNORMAL LOW (ref >60)
GFR, EST AFRICAN AMERICAN: 35 mL/min — AB (ref >60)
GLUCOSE: 93 mg/dL (ref 65–99)
Potassium: 4.1 mmol/L (ref 3.5–5.1)
SODIUM: 135 mmol/L (ref 135–145)
Total Bilirubin: 0.3 mg/dL (ref 0.3–1.2)
Total Protein: 7.2 g/dL (ref 6.5–8.1)

## 2016-02-26 LAB — FERRITIN: Ferritin: 15 ng/mL (ref 11–307)

## 2016-02-26 NOTE — Progress Notes (Signed)
Patient states she has a tickling cough.

## 2016-02-26 NOTE — Telephone Encounter (Signed)
I ordered Feraheme weekly 2 for the patient;  This could be started next week.  Follow-up is planned. Please inform pt/schedule. Thx

## 2016-02-26 NOTE — Progress Notes (Signed)
[place] OFFICE PROGRESS NOTE  Patient Care Team: [facility] as PCP - General (General Practice)   SUMMARY OF ONCOLOGIC HISTORY:  # AUG 2016- ELEVATED K/L ratio 1.99; SPEP- Neg.   # March 2017- Anemia- Hb 8; plan Iv iron  # CKD [? nephrologist as per pt; ? Dr.Kruska]/ CAD  INTERVAL HISTORY:  This is my first interaction with the patient since I joined the practice September 2016. I reviewed the patient's prior charts/pertinent labs/imaging in detail; findings are summarized above.    65 year old pleasant female patient with above history of elevated Lambda light chain ratio is here for follow-up.  She does complain of fatigue especially exertion. Otherwise no  Blood in stools black stools.  She had colonoscopy many years ago.  She denies any difficulty swallowing pain with swallowing or any significant weight loss.  REVIEW OF SYSTEMS:  A complete 10 point review of system is done which is negative except mentioned above/history of present illness.   PAST MEDICAL HISTORY :  Past Medical History  Diagnosis Date  . Coronary artery disease     a. 03/2014 NSTEMI/Cath: LM nl, LAD 54m, 60d, D1 60, D2 nl, d3 40, LCX 100 w/ L->L collats, RCA non dom, min irregs, EF 55%-->Med Rx.  . Diabetes mellitus without complication ([place])   . Hypertensive heart disease     a. 03/2015 Echo: 55-60%, mild conc LVH.  . CKD (chronic kidney disease), stage IV ([place])   . GERD (gastroesophageal reflux disease)   . DDD (degenerative disc disease)     a. s/p upper back surgery spring 2015.  Marland Kitchen Arthritis   . Hiatal hernia   . Hyperlipidemia   . Syncope and collapse     a. Early 03/2014->did not seek medical attn.  . Anxiety   . Fibromyalgia   . Anemia   . Diverticulosis   . Tobacco abuse     PAST SURGICAL HISTORY :   Past Surgical History  Procedure Laterality Date  . Neck surgery      Degenerative Disk Disease and removal of a spinal cyst  . Cardiac catheterization   06/2005    [facility]  . Cardiac catheterization  04/2014    [facility]  . Tubal ligation    . Breast cyst aspiration Right 2005    NEG  . Coronary angioplasty    . Dilatation & curettage/hysteroscopy with myosure N/A 09/18/2015    Procedure: DILATATION & CURETTAGE/HYSTEROSCOPY WITH MYOSURE;  Surgeon: Boykin Nearing, MD;  Location: [facility] ORS;  Service: Gynecology;  Laterality: N/A;    FAMILY HISTORY :   Family History  Problem Relation Age of Onset  . Heart disease Mother   . Heart attack Mother   . Hypertension Mother   . Hyperlipidemia Mother   . Breast cancer Sister     SOCIAL HISTORY:   Social History  Substance Use Topics  . Smoking status: Current Every Day Smoker -- 0.25 packs/day for 40 years    Types: Cigarettes  . Smokeless tobacco: Never Used  . Alcohol Use: No    ALLERGIES:  is allergic to aspirin and hydrochlorothiazide.  MEDICATIONS:  Current Outpatient Prescriptions  Medication Sig Dispense Refill  . allopurinol (ZYLOPRIM) 100 MG tablet Take 100 mg by mouth 2 (two) times daily.    Marland Kitchen amLODipine (NORVASC) 5 MG tablet Take 1 tablet (5 mg total) by mouth daily. 30 tablet 3  . amoxicillin (AMOXIL) 500 MG capsule Take 1 capsule (500 mg total) by  mouth 3 (three) times daily. 30 capsule 0  . aspirin 81 MG chewable tablet Chew 81 mg by mouth daily.    Marland Kitchen atorvastatin (LIPITOR) 40 MG tablet Take 40 mg by mouth daily at 6 PM.     . cholecalciferol (VITAMIN D) 1000 UNITS tablet Take 2,000 Units by mouth 2 (two) times daily.     . cyclobenzaprine (FLEXERIL) 5 MG tablet Take 5 mg by mouth as needed for muscle spasms.    . insulin glargine (LANTUS) 100 UNIT/ML injection Inject 35 Units into the skin every morning. 44 units at bedtime    . Insulin Lispro, Human, (HUMALOG KWIKPEN [place]) Inject 8 Units into the skin every morning. 28 units at lunch and supper    . lisinopril (PRINIVIL,ZESTRIL) 5 MG tablet Take 5 mg by mouth at bedtime.    . magnesium oxide (MAG-OX) 400 MG tablet Take  400 mg by mouth 2 (two) times daily.    . meloxicam (MOBIC) 15 MG tablet Take 15 mg by mouth daily.    . metoprolol tartrate (LOPRESSOR) 25 MG tablet Take 1 tablet (25 mg total) by mouth 2 (two) times daily. 60 tablet 3  . ranitidine (ZANTAC) 150 MG tablet Take 150 mg by mouth 2 (two) times daily.    Marland Kitchen triamcinolone cream (KENALOG) 0.1 % Apply 1 application topically 2 (two) times daily.    . clopidogrel (PLAVIX) 75 MG tablet Take 1 tablet (75 mg total) by mouth daily. (Patient not taking: Reported on 02/26/2016) 30 tablet 11  . nitroGLYCERIN (NITROSTAT) 0.4 MG SL tablet Place 1 tablet (0.4 mg total) under the tongue every 5 (five) minutes as needed for chest pain. (Patient not taking: Reported on 02/26/2016) 25 tablet 1  . Omega-3 Fatty Acids (FISH OIL MAXIMUM STRENGTH) 1200 MG CAPS   0   No current facility-administered medications for this visit.    PHYSICAL EXAMINATION:   BP 124/69 mmHg  Pulse 76  Temp(Src) 97.2 F (36.2 C) (Tympanic)  Resp 18  Wt 161 lb 9.6 oz (73.3 kg)  Filed Weights   02/26/16 1530  Weight: 161 lb 9.6 oz (73.3 kg)    GENERAL: Well-nourished well-developed; Alert, no distress and comfortable.  Alone.  EYES: no pallor or icterus OROPHARYNX: no thrush or ulceration; good dentition  NECK: supple, no masses felt LYMPH:  no palpable lymphadenopathy in the cervical, axillary or inguinal regions LUNGS: clear to auscultation and  No wheeze or crackles HEART/CVS: regular rate & rhythm and no murmurs; No lower extremity edema ABDOMEN:abdomen soft, non-tender and normal bowel sounds Musculoskeletal:no cyanosis of digits and no clubbing  PSYCH: alert & oriented x 3 with fluent speech NEURO: no focal motor/sensory deficits SKIN:  no rashes or significant lesions  LABORATORY DATA:  I have reviewed the data as listed    Component Value Date/Time   NA 135 02/26/2016 1452   NA 139 10/21/2014 1332   K 4.1 02/26/2016 1452   K 4.4 10/21/2014 1332   CL 107 02/26/2016  1452   CL 108* 10/21/2014 1332   CO2 23 02/26/2016 1452   CO2 24 10/21/2014 1332   GLUCOSE 93 02/26/2016 1452   GLUCOSE 103* 10/21/2014 1332   BUN 22* 02/26/2016 1452   BUN 21* 10/21/2014 1332   CREATININE 1.72* 02/26/2016 1452   CREATININE 1.45* 10/21/2014 1332   CALCIUM 9.6 02/26/2016 1452   CALCIUM 9.1 10/21/2014 1332   PROT 7.2 02/26/2016 1452   PROT 7.5 10/21/2014 1332   ALBUMIN 3.7 02/26/2016 1452  ALBUMIN 3.6 10/21/2014 1332   AST 24 02/26/2016 1452   AST 15 10/21/2014 1332   ALT 22 02/26/2016 1452   ALT 27 10/21/2014 1332   ALKPHOS 77 02/26/2016 1452   ALKPHOS 94 10/21/2014 1332   BILITOT 0.3 02/26/2016 1452   BILITOT 0.2 10/21/2014 1332   GFRNONAA 30* 02/26/2016 1452   GFRNONAA 39* 10/21/2014 1332   GFRNONAA 27* 05/10/2014 0031   GFRAA 35* 02/26/2016 1452   GFRAA 47* 10/21/2014 1332   GFRAA 32* 05/10/2014 0031    No results found for: SPEP, UPEP  Lab Results  Component Value Date   WBC 7.3 02/26/2016   NEUTROABS 4.2 02/26/2016   HGB 8.8* 02/26/2016   HCT 27.4* 02/26/2016   MCV 87.8 02/26/2016   PLT 429 02/26/2016      Chemistry      Component Value Date/Time   NA 135 02/26/2016 1452   NA 139 10/21/2014 1332   K 4.1 02/26/2016 1452   K 4.4 10/21/2014 1332   CL 107 02/26/2016 1452   CL 108* 10/21/2014 1332   CO2 23 02/26/2016 1452   CO2 24 10/21/2014 1332   BUN 22* 02/26/2016 1452   BUN 21* 10/21/2014 1332   CREATININE 1.72* 02/26/2016 1452   CREATININE 1.45* 10/21/2014 1332      Component Value Date/Time   CALCIUM 9.6 02/26/2016 1452   CALCIUM 9.1 10/21/2014 1332   ALKPHOS 77 02/26/2016 1452   ALKPHOS 94 10/21/2014 1332   AST 24 02/26/2016 1452   AST 15 10/21/2014 1332   ALT 22 02/26/2016 1452   ALT 27 10/21/2014 1332   BILITOT 0.3 02/26/2016 1452   BILITOT 0.2 10/21/2014 1332       ASSESSMENT & PLAN:   #  Anemia- . Hemoglobin is 8.8.  I suspect this is from anemia of chronic kidney disease/iron deficiency.  I would recommend  checking iron studies today- ferritin iron studies;  And if low recommend IV iron.  Discussed with the patient regarding IV iron patient is open to the idea. 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  Will await repeat workup at this time.  # CKD- creatinine-1.72  #  Patient follow-up with me in approximately 2 months/ with CBC and studies.    Cammie Sickle, MD 02/26/2016 5:06 PM

## 2016-02-29 ENCOUNTER — Other Ambulatory Visit: Payer: Self-pay | Admitting: Internal Medicine

## 2016-02-29 ENCOUNTER — Telehealth: Payer: Self-pay | Admitting: *Deleted

## 2016-02-29 ENCOUNTER — Other Ambulatory Visit: Payer: Self-pay | Admitting: *Deleted

## 2016-02-29 DIAGNOSIS — D508 Other iron deficiency anemias: Secondary | ICD-10-CM

## 2016-02-29 LAB — KAPPA/LAMBDA LIGHT CHAINS
KAPPA, LAMDA LIGHT CHAIN RATIO: 2.16 — AB (ref 0.26–1.65)
Kappa free light chain: 36.58 mg/L — ABNORMAL HIGH (ref 3.30–19.40)
Lambda free light chains: 16.95 mg/L (ref 5.71–26.30)

## 2016-02-29 LAB — PROTEIN ELECTROPHORESIS, SERUM
A/G Ratio: 1.1 (ref 0.7–1.7)
ALBUMIN ELP: 3.5 g/dL (ref 2.9–4.4)
ALPHA-1-GLOBULIN: 0.3 g/dL (ref 0.0–0.4)
Alpha-2-Globulin: 1.1 g/dL — ABNORMAL HIGH (ref 0.4–1.0)
Beta Globulin: 1.1 g/dL (ref 0.7–1.3)
GAMMA GLOBULIN: 0.9 g/dL (ref 0.4–1.8)
GLOBULIN, TOTAL: 3.3 g/dL (ref 2.2–3.9)
TOTAL PROTEIN ELP: 6.8 g/dL (ref 6.0–8.5)

## 2016-02-29 NOTE — Telephone Encounter (Signed)
Called patient to inform her that MD recommends IV iron weekly for 2 weeks. She should also pick up stool cards to rule out GI bleed when she comes for next appointment.  Informed that a scheduler will be contacting her with her appointment information.

## 2016-02-29 NOTE — Telephone Encounter (Signed)
-----   Message from Cammie Sickle, MD sent at 02/29/2016  7:59 AM EDT ----- I recommend IV Ferrahem x2- please schedule. Thx

## 2016-02-29 NOTE — Telephone Encounter (Signed)
-----   Message from Cammie Sickle, MD sent at 02/29/2016  8:00 AM EDT ----- Also, Ferrel [place]ington- please order stool occult cards x 2 for the patient. Thanks.

## 2016-03-01 DIAGNOSIS — R1310 Dysphagia, unspecified: Secondary | ICD-10-CM | POA: Insufficient documentation

## 2016-03-02 ENCOUNTER — Inpatient Hospital Stay: Payer: Medicaid Other | Attending: Internal Medicine

## 2016-03-02 ENCOUNTER — Inpatient Hospital Stay: Payer: Medicaid Other

## 2016-03-02 VITALS — BP 141/78 | HR 98 | Temp 96.7°F | Resp 20

## 2016-03-02 DIAGNOSIS — D508 Other iron deficiency anemias: Secondary | ICD-10-CM

## 2016-03-02 DIAGNOSIS — D472 Monoclonal gammopathy: Secondary | ICD-10-CM | POA: Insufficient documentation

## 2016-03-02 DIAGNOSIS — D649 Anemia, unspecified: Secondary | ICD-10-CM | POA: Insufficient documentation

## 2016-03-02 DIAGNOSIS — F17210 Nicotine dependence, cigarettes, uncomplicated: Secondary | ICD-10-CM | POA: Insufficient documentation

## 2016-03-02 DIAGNOSIS — N184 Chronic kidney disease, stage 4 (severe): Secondary | ICD-10-CM | POA: Diagnosis not present

## 2016-03-02 DIAGNOSIS — Z79899 Other long term (current) drug therapy: Secondary | ICD-10-CM | POA: Insufficient documentation

## 2016-03-02 MED ORDER — SODIUM CHLORIDE 0.9 % IV SOLN
510.0000 mg | Freq: Once | INTRAVENOUS | Status: AC
  Administered 2016-03-02: 510 mg via INTRAVENOUS
  Filled 2016-03-02: qty 17

## 2016-03-02 MED ORDER — DIPHENHYDRAMINE HCL 50 MG/ML IJ SOLN
25.0000 mg | Freq: Once | INTRAMUSCULAR | Status: AC
  Administered 2016-03-02: 25 mg via INTRAVENOUS
  Filled 2016-03-02: qty 1

## 2016-03-02 MED ORDER — SODIUM CHLORIDE 0.9 % IV SOLN
Freq: Once | INTRAVENOUS | Status: AC
  Administered 2016-03-02: 15:00:00 via INTRAVENOUS
  Filled 2016-03-02: qty 1000

## 2016-03-03 ENCOUNTER — Other Ambulatory Visit: Payer: Self-pay

## 2016-03-03 DIAGNOSIS — D508 Other iron deficiency anemias: Secondary | ICD-10-CM

## 2016-03-03 DIAGNOSIS — D649 Anemia, unspecified: Secondary | ICD-10-CM | POA: Diagnosis not present

## 2016-03-03 LAB — OCCULT BLOOD X 1 CARD TO LAB, STOOL
FECAL OCCULT BLD: POSITIVE — AB
Fecal Occult Bld: POSITIVE — AB

## 2016-03-04 ENCOUNTER — Telehealth: Payer: Self-pay | Admitting: *Deleted

## 2016-03-04 NOTE — Telephone Encounter (Signed)
Contacted patient. She provided verbal understanding on hem-pos. Stool cards. She is agreeable to go to GI. She called her primary care provider this morning, who will set up the GI ref at [facility].

## 2016-03-04 NOTE — Telephone Encounter (Signed)
-----   Message from Cammie Sickle, MD sent at 03/03/2016  5:12 PM EDT ----- Recommend follow-up with GI/ for heme-positive stools/iron deficiency anemia. Please check with PCP re: GI referral. Cillian Gwinner- Please inform patient.

## 2016-03-07 ENCOUNTER — Other Ambulatory Visit: Payer: Self-pay | Admitting: *Deleted

## 2016-03-07 DIAGNOSIS — D509 Iron deficiency anemia, unspecified: Secondary | ICD-10-CM

## 2016-03-07 MED ORDER — METOPROLOL TARTRATE 25 MG PO TABS: 25.0000 mg | ORAL_TABLET | Freq: Two times a day (BID) | ORAL | Status: DC

## 2016-03-09 ENCOUNTER — Inpatient Hospital Stay: Payer: Medicaid Other

## 2016-03-09 VITALS — BP 115/68 | HR 76 | Temp 96.3°F | Resp 20

## 2016-03-09 DIAGNOSIS — D508 Other iron deficiency anemias: Secondary | ICD-10-CM

## 2016-03-09 DIAGNOSIS — D649 Anemia, unspecified: Secondary | ICD-10-CM | POA: Diagnosis not present

## 2016-03-09 MED ORDER — SODIUM CHLORIDE 0.9 % IV SOLN
Freq: Once | INTRAVENOUS | Status: AC
  Administered 2016-03-09: 15:00:00 via INTRAVENOUS
  Filled 2016-03-09: qty 1000

## 2016-03-09 MED ORDER — SODIUM CHLORIDE 0.9 % IV SOLN
510.0000 mg | Freq: Once | INTRAVENOUS | Status: AC
  Administered 2016-03-09: 510 mg via INTRAVENOUS
  Filled 2016-03-09: qty 17

## 2016-03-09 MED ORDER — DIPHENHYDRAMINE HCL 50 MG/ML IJ SOLN
25.0000 mg | Freq: Once | INTRAMUSCULAR | Status: AC
  Administered 2016-03-09: 25 mg via INTRAVENOUS
  Filled 2016-03-09: qty 1

## 2016-03-21 ENCOUNTER — Other Ambulatory Visit: Payer: Self-pay | Admitting: Physician Assistant

## 2016-03-21 ENCOUNTER — Telehealth: Payer: Self-pay | Admitting: Cardiovascular Disease

## 2016-03-21 DIAGNOSIS — R1319 Other dysphagia: Secondary | ICD-10-CM

## 2016-03-21 NOTE — Telephone Encounter (Signed)
Clearance/Risk Assesment Request from [facility] for May 31 EGD received and placed in MD basket

## 2016-03-23 NOTE — Telephone Encounter (Signed)
Faxed EGD clearance to [facility], [phone]

## 2016-03-24 ENCOUNTER — Ambulatory Visit
Admission: RE | Admit: 2016-03-24 | Discharge: 2016-03-24 | Disposition: A | Discharge status: Home / Self Care | Payer: Medicaid Other | Source: Ambulatory Visit | Attending: Physician Assistant | Admitting: Physician Assistant

## 2016-03-24 DIAGNOSIS — R1319 Other dysphagia: Secondary | ICD-10-CM | POA: Diagnosis not present

## 2016-03-24 DIAGNOSIS — K219 Gastro-esophageal reflux disease without esophagitis: Secondary | ICD-10-CM | POA: Diagnosis not present

## 2016-03-24 DIAGNOSIS — K224 Dyskinesia of esophagus: Secondary | ICD-10-CM | POA: Insufficient documentation

## 2016-03-24 DIAGNOSIS — K449 Diaphragmatic hernia without obstruction or gangrene: Secondary | ICD-10-CM | POA: Insufficient documentation

## 2016-04-22 ENCOUNTER — Inpatient Hospital Stay: Payer: Medicaid Other | Attending: Internal Medicine

## 2016-04-22 ENCOUNTER — Inpatient Hospital Stay (HOSPITAL_BASED_OUTPATIENT_CLINIC_OR_DEPARTMENT_OTHER): Payer: Medicaid Other | Admitting: Internal Medicine

## 2016-04-22 VITALS — BP 132/80 | HR 76 | Temp 97.6°F | Resp 18 | Wt 169.5 lb

## 2016-04-22 DIAGNOSIS — R768 Other specified abnormal immunological findings in serum: Secondary | ICD-10-CM

## 2016-04-22 DIAGNOSIS — D509 Iron deficiency anemia, unspecified: Secondary | ICD-10-CM | POA: Diagnosis not present

## 2016-04-22 DIAGNOSIS — N184 Chronic kidney disease, stage 4 (severe): Secondary | ICD-10-CM | POA: Insufficient documentation

## 2016-04-22 DIAGNOSIS — E119 Type 2 diabetes mellitus without complications: Secondary | ICD-10-CM | POA: Insufficient documentation

## 2016-04-22 DIAGNOSIS — I2510 Atherosclerotic heart disease of native coronary artery without angina pectoris: Secondary | ICD-10-CM | POA: Diagnosis not present

## 2016-04-22 DIAGNOSIS — M1990 Unspecified osteoarthritis, unspecified site: Secondary | ICD-10-CM | POA: Insufficient documentation

## 2016-04-22 DIAGNOSIS — F17210 Nicotine dependence, cigarettes, uncomplicated: Secondary | ICD-10-CM

## 2016-04-22 DIAGNOSIS — Z79899 Other long term (current) drug therapy: Secondary | ICD-10-CM | POA: Insufficient documentation

## 2016-04-22 DIAGNOSIS — Z794 Long term (current) use of insulin: Secondary | ICD-10-CM | POA: Diagnosis not present

## 2016-04-22 DIAGNOSIS — I252 Old myocardial infarction: Secondary | ICD-10-CM | POA: Diagnosis not present

## 2016-04-22 DIAGNOSIS — Z7982 Long term (current) use of aspirin: Secondary | ICD-10-CM | POA: Insufficient documentation

## 2016-04-22 DIAGNOSIS — I119 Hypertensive heart disease without heart failure: Secondary | ICD-10-CM | POA: Insufficient documentation

## 2016-04-22 DIAGNOSIS — K219 Gastro-esophageal reflux disease without esophagitis: Secondary | ICD-10-CM | POA: Diagnosis not present

## 2016-04-22 DIAGNOSIS — F419 Anxiety disorder, unspecified: Secondary | ICD-10-CM | POA: Insufficient documentation

## 2016-04-22 DIAGNOSIS — E785 Hyperlipidemia, unspecified: Secondary | ICD-10-CM | POA: Insufficient documentation

## 2016-04-22 DIAGNOSIS — D508 Other iron deficiency anemias: Secondary | ICD-10-CM

## 2016-04-22 DIAGNOSIS — M797 Fibromyalgia: Secondary | ICD-10-CM | POA: Diagnosis not present

## 2016-04-22 LAB — BASIC METABOLIC PANEL
ANION GAP: 7 (ref 5–15)
BUN: 30 mg/dL — ABNORMAL HIGH (ref 6–20)
CALCIUM: 9.4 mg/dL (ref 8.9–10.3)
CO2: 23 mmol/L (ref 22–32)
Chloride: 107 mmol/L (ref 101–111)
Creatinine, Ser: 1.71 mg/dL — ABNORMAL HIGH (ref 0.44–1.00)
GFR, EST AFRICAN AMERICAN: 35 mL/min — AB (ref >60)
GFR, EST NON AFRICAN AMERICAN: 30 mL/min — AB (ref >60)
Glucose, Bld: 156 mg/dL — ABNORMAL HIGH (ref 65–99)
Potassium: 4.6 mmol/L (ref 3.5–5.1)
Sodium: 137 mmol/L (ref 135–145)

## 2016-04-22 LAB — CBC WITH DIFFERENTIAL/PLATELET
BASOS ABS: 0.1 10*3/uL (ref 0–0.1)
Basophils Relative: 1 %
EOS ABS: 0.1 10*3/uL (ref 0–0.7)
EOS PCT: 2 %
HEMATOCRIT: 37.7 % (ref 35.0–47.0)
Hemoglobin: 12.4 g/dL (ref 12.0–16.0)
LYMPHS ABS: 2 10*3/uL (ref 1.0–3.6)
Lymphocytes Relative: 35 %
MCH: 29.8 pg (ref 26.0–34.0)
MCHC: 32.9 g/dL (ref 32.0–36.0)
MCV: 90.5 fL (ref 80.0–100.0)
Monocytes Absolute: 0.5 10*3/uL (ref 0.2–0.9)
Monocytes Relative: 9 %
NEUTROS ABS: 3 10*3/uL (ref 1.4–6.5)
Neutrophils Relative %: 53 %
PLATELETS: 326 10*3/uL (ref 150–440)
RBC: 4.16 MIL/uL (ref 3.80–5.20)
RDW: 17.8 % — AB (ref 11.5–14.5)
WBC: 5.8 10*3/uL (ref 3.6–11.0)

## 2016-04-22 LAB — IRON AND TIBC
IRON: 60 ug/dL (ref 28–170)
Saturation Ratios: 19 % (ref 10.4–31.8)
TIBC: 312 ug/dL (ref 250–450)
UIBC: 252 ug/dL

## 2016-04-22 LAB — FERRITIN: Ferritin: 87 ng/mL (ref 11–307)

## 2016-04-22 NOTE — Progress Notes (Signed)
[place] OFFICE PROGRESS NOTE  Patient Care Team: [facility] as PCP - General (General Practice)   SUMMARY OF ONCOLOGIC HISTORY:  # AUG 2016- ELEVATED K/L ratio 1.99; SPEP- Neg.   # March 2017- Anemia- Hb 8; plan Iv iron s/p IV iron x2 [[april 2017]  # CKD [? nephrologist as per pt; ? Dr.Kruska]/ CAD  INTERVAL HISTORY:  65 year old pleasant female patient with above history of elevated Lambda light chain ratio is here for follow-up.   Patient denies any blood in stools. Patient is on by mouth iron. Intermittent constipation. She is awaiting a colonoscopy by Dr. Vira Agar on May 30. No difficulty swallowing or weight loss.  A significant improvement of her fatigue levels post IV iron.   REVIEW OF SYSTEMS:  A complete 10 point review of system is done which is negative except mentioned above/history of present illness.   PAST MEDICAL HISTORY :  Past Medical History  Diagnosis Date  . Coronary artery disease     a. 03/2014 NSTEMI/Cath: LM nl, LAD 52m, 60d, D1 60, D2 nl, d3 40, LCX 100 w/ L->L collats, RCA non dom, min irregs, EF 55%-->Med Rx.  . Diabetes mellitus without complication ([place])   . Hypertensive heart disease     a. 03/2015 Echo: 55-60%, mild conc LVH.  . CKD (chronic kidney disease), stage IV ([place])   . GERD (gastroesophageal reflux disease)   . DDD (degenerative disc disease)     a. s/p upper back surgery spring 2015.  Marland Kitchen Arthritis   . Hiatal hernia   . Hyperlipidemia   . Syncope and collapse     a. Early 03/2014->did not seek medical attn.  . Anxiety   . Fibromyalgia   . Anemia   . Diverticulosis   . Tobacco abuse     PAST SURGICAL HISTORY :   Past Surgical History  Procedure Laterality Date  . Neck surgery      Degenerative Disk Disease and removal of a spinal cyst  . Cardiac catheterization  06/2005    [facility]  . Cardiac catheterization  04/2014    [facility]  . Tubal ligation    . Breast cyst aspiration Right 2005    NEG  .  Coronary angioplasty    . Dilatation & curettage/hysteroscopy with myosure N/A 09/18/2015    Procedure: DILATATION & CURETTAGE/HYSTEROSCOPY WITH MYOSURE;  Surgeon: Boykin Nearing, MD;  Location: [facility] ORS;  Service: Gynecology;  Laterality: N/A;    FAMILY HISTORY :   Family History  Problem Relation Age of Onset  . Heart disease Mother   . Heart attack Mother   . Hypertension Mother   . Hyperlipidemia Mother   . Breast cancer Sister     SOCIAL HISTORY:   Social History  Substance Use Topics  . Smoking status: Current Every Day Smoker -- 0.25 packs/day for 40 years    Types: Cigarettes  . Smokeless tobacco: Never Used  . Alcohol Use: No    ALLERGIES:  is allergic to aspirin and hydrochlorothiazide.  MEDICATIONS:  Current Outpatient Prescriptions  Medication Sig Dispense Refill  . allopurinol (ZYLOPRIM) 100 MG tablet Take 100 mg by mouth 2 (two) times daily.    Marland Kitchen amLODipine (NORVASC) 5 MG tablet Take 1 tablet (5 mg total) by mouth daily. 30 tablet 3  . aspirin 81 MG chewable tablet Chew 81 mg by mouth daily.    Marland Kitchen atorvastatin (LIPITOR) 40 MG tablet Take 40 mg by mouth daily at 6 PM.     .  clopidogrel (PLAVIX) 75 MG tablet Take 1 tablet (75 mg total) by mouth daily. 30 tablet 11  . cyclobenzaprine (FLEXERIL) 5 MG tablet Take 5 mg by mouth as needed for muscle spasms.    . insulin glargine (LANTUS) 100 UNIT/ML injection Inject 35 Units into the skin every morning. 44 units at bedtime    . Insulin Lispro, Human, (HUMALOG KWIKPEN [place]) Inject 8 Units into the skin every morning. 28 units at lunch and supper    . lisinopril (PRINIVIL,ZESTRIL) 5 MG tablet Take 5 mg by mouth at bedtime.    . magnesium oxide (MAG-OX) 400 MG tablet Take 400 mg by mouth 2 (two) times daily.    . meloxicam (MOBIC) 15 MG tablet Take 15 mg by mouth daily.    . metoprolol tartrate (LOPRESSOR) 25 MG tablet Take 1 tablet (25 mg total) by mouth 2 (two) times daily. 60 tablet 3  . Omega-3 Fatty Acids (FISH  OIL MAXIMUM STRENGTH) 1200 MG CAPS   0  . ranitidine (ZANTAC) 150 MG tablet Take 150 mg by mouth 2 (two) times daily.    Marland Kitchen triamcinolone cream (KENALOG) 0.1 % Apply 1 application topically 2 (two) times daily.     No current facility-administered medications for this visit.    PHYSICAL EXAMINATION:   BP 132/80 mmHg  Pulse 76  Temp(Src) 97.6 F (36.4 C) (Tympanic)  Resp 18  Wt 169 lb 8.5 oz (76.9 kg)  Filed Weights   04/22/16 1404  Weight: 169 lb 8.5 oz (76.9 kg)    GENERAL: Well-nourished well-developed; Alert, no distress and comfortable.  Alone.  EYES: no pallor or icterus OROPHARYNX: no thrush or ulceration; good dentition  NECK: supple, no masses felt LYMPH:  no palpable lymphadenopathy in the cervical, axillary or inguinal regions LUNGS: clear to auscultation and  No wheeze or crackles HEART/CVS: regular rate & rhythm and no murmurs; No lower extremity edema ABDOMEN:abdomen soft, non-tender and normal bowel sounds Musculoskeletal:no cyanosis of digits and no clubbing  PSYCH: alert & oriented x 3 with fluent speech NEURO: no focal motor/sensory deficits SKIN:  no rashes or significant lesions  LABORATORY DATA:  I have reviewed the data as listed    Component Value Date/Time   NA 137 04/22/2016 1333   NA 139 10/21/2014 1332   K 4.6 04/22/2016 1333   K 4.4 10/21/2014 1332   CL 107 04/22/2016 1333   CL 108* 10/21/2014 1332   CO2 23 04/22/2016 1333   CO2 24 10/21/2014 1332   GLUCOSE 156* 04/22/2016 1333   GLUCOSE 103* 10/21/2014 1332   BUN 30* 04/22/2016 1333   BUN 21* 10/21/2014 1332   CREATININE 1.71* 04/22/2016 1333   CREATININE 1.45* 10/21/2014 1332   CALCIUM 9.4 04/22/2016 1333   CALCIUM 9.1 10/21/2014 1332   PROT 7.2 02/26/2016 1452   PROT 7.5 10/21/2014 1332   ALBUMIN 3.7 02/26/2016 1452   ALBUMIN 3.6 10/21/2014 1332   AST 24 02/26/2016 1452   AST 15 10/21/2014 1332   ALT 22 02/26/2016 1452   ALT 27 10/21/2014 1332   ALKPHOS 77 02/26/2016 1452    ALKPHOS 94 10/21/2014 1332   BILITOT 0.3 02/26/2016 1452   BILITOT 0.2 10/21/2014 1332   GFRNONAA 30* 04/22/2016 1333   GFRNONAA 39* 10/21/2014 1332   GFRNONAA 27* 05/10/2014 0031   GFRAA 35* 04/22/2016 1333   GFRAA 47* 10/21/2014 1332   GFRAA 32* 05/10/2014 0031    No results found for: SPEP, UPEP  Lab Results  Component Value  Date   WBC 5.8 04/22/2016   NEUTROABS PENDING 04/22/2016   HGB 12.4 04/22/2016   HCT 37.7 04/22/2016   MCV 90.5 04/22/2016   PLT 326 04/22/2016      Chemistry      Component Value Date/Time   NA 137 04/22/2016 1333   NA 139 10/21/2014 1332   K 4.6 04/22/2016 1333   K 4.4 10/21/2014 1332   CL 107 04/22/2016 1333   CL 108* 10/21/2014 1332   CO2 23 04/22/2016 1333   CO2 24 10/21/2014 1332   BUN 30* 04/22/2016 1333   BUN 21* 10/21/2014 1332   CREATININE 1.71* 04/22/2016 1333   CREATININE 1.45* 10/21/2014 1332      Component Value Date/Time   CALCIUM 9.4 04/22/2016 1333   CALCIUM 9.1 10/21/2014 1332   ALKPHOS 77 02/26/2016 1452   ALKPHOS 94 10/21/2014 1332   AST 24 02/26/2016 1452   AST 15 10/21/2014 1332   ALT 22 02/26/2016 1452   ALT 27 10/21/2014 1332   BILITOT 0.3 02/26/2016 1452   BILITOT 0.2 10/21/2014 1332       ASSESSMENT & PLAN:   #  Anemia Iron deficiency- hemoglobin significant improved to 12.2 post IV iron in April 2017. Awaiting iron studies from today. If saturation less than 30% I recommend IV iron. Otherwise patient will follow-up with me in approximate 4 months for possible ferriheme; labs 1 week prior.  #  Lambda light chain ratio slightly elevated-  Likely from a chronic renal insufficiency; unlikely  MGUS.  Ratio 2.27 February 2016. Repeat One year.   # CKD- creatinine-1.72/stable     Cammie Sickle, MD 04/22/2016 2:14 PM

## 2016-04-22 NOTE — Progress Notes (Signed)
Pt states that she has a colonoscopy scheduled for next Wednesday with Dr. Vira Agar at [place].

## 2016-04-22 NOTE — Progress Notes (Signed)
Patient complains of her hands being numb.  States she has carpal tunnel.  Otherwise no complaints.

## 2016-04-27 ENCOUNTER — Ambulatory Visit: Payer: Medicaid Other | Admitting: Anesthesiology

## 2016-04-27 ENCOUNTER — Encounter: Payer: Self-pay | Admitting: Anesthesiology

## 2016-04-27 ENCOUNTER — Encounter
Admission: RE | Disposition: A | Discharge status: Home / Self Care | Payer: Self-pay | Source: Ambulatory Visit | Attending: Unknown Physician Specialty

## 2016-04-27 ENCOUNTER — Ambulatory Visit
Admission: RE | Admit: 2016-04-27 | Discharge: 2016-04-27 | Disposition: A | Discharge status: Home / Self Care | Payer: Medicaid Other | Source: Ambulatory Visit | Attending: Unknown Physician Specialty | Admitting: Unknown Physician Specialty

## 2016-04-27 DIAGNOSIS — I2510 Atherosclerotic heart disease of native coronary artery without angina pectoris: Secondary | ICD-10-CM | POA: Diagnosis not present

## 2016-04-27 DIAGNOSIS — R1310 Dysphagia, unspecified: Secondary | ICD-10-CM | POA: Diagnosis not present

## 2016-04-27 DIAGNOSIS — E785 Hyperlipidemia, unspecified: Secondary | ICD-10-CM | POA: Insufficient documentation

## 2016-04-27 DIAGNOSIS — K635 Polyp of colon: Secondary | ICD-10-CM | POA: Diagnosis not present

## 2016-04-27 DIAGNOSIS — F419 Anxiety disorder, unspecified: Secondary | ICD-10-CM | POA: Diagnosis not present

## 2016-04-27 DIAGNOSIS — D509 Iron deficiency anemia, unspecified: Secondary | ICD-10-CM | POA: Diagnosis present

## 2016-04-27 DIAGNOSIS — K640 First degree hemorrhoids: Secondary | ICD-10-CM | POA: Diagnosis not present

## 2016-04-27 DIAGNOSIS — Z7984 Long term (current) use of oral hypoglycemic drugs: Secondary | ICD-10-CM | POA: Diagnosis not present

## 2016-04-27 DIAGNOSIS — M1990 Unspecified osteoarthritis, unspecified site: Secondary | ICD-10-CM | POA: Diagnosis not present

## 2016-04-27 DIAGNOSIS — K2970 Gastritis, unspecified, without bleeding: Secondary | ICD-10-CM | POA: Diagnosis not present

## 2016-04-27 DIAGNOSIS — E1122 Type 2 diabetes mellitus with diabetic chronic kidney disease: Secondary | ICD-10-CM | POA: Insufficient documentation

## 2016-04-27 DIAGNOSIS — M797 Fibromyalgia: Secondary | ICD-10-CM | POA: Insufficient documentation

## 2016-04-27 DIAGNOSIS — I1310 Hypertensive heart and chronic kidney disease without heart failure, with stage 1 through stage 4 chronic kidney disease, or unspecified chronic kidney disease: Secondary | ICD-10-CM | POA: Insufficient documentation

## 2016-04-27 DIAGNOSIS — K5790 Diverticulosis of intestine, part unspecified, without perforation or abscess without bleeding: Secondary | ICD-10-CM | POA: Diagnosis not present

## 2016-04-27 DIAGNOSIS — K210 Gastro-esophageal reflux disease with esophagitis: Secondary | ICD-10-CM | POA: Diagnosis not present

## 2016-04-27 DIAGNOSIS — N184 Chronic kidney disease, stage 4 (severe): Secondary | ICD-10-CM | POA: Insufficient documentation

## 2016-04-27 DIAGNOSIS — F17210 Nicotine dependence, cigarettes, uncomplicated: Secondary | ICD-10-CM | POA: Diagnosis not present

## 2016-04-27 DIAGNOSIS — Z7982 Long term (current) use of aspirin: Secondary | ICD-10-CM | POA: Insufficient documentation

## 2016-04-27 DIAGNOSIS — Z79899 Other long term (current) drug therapy: Secondary | ICD-10-CM | POA: Diagnosis not present

## 2016-04-27 HISTORY — PX: ESOPHAGOGASTRODUODENOSCOPY (EGD) WITH PROPOFOL: SHX5813

## 2016-04-27 HISTORY — PX: COLONOSCOPY WITH PROPOFOL: SHX5780

## 2016-04-27 LAB — GLUCOSE, CAPILLARY: Glucose-Capillary: 141 mg/dL — ABNORMAL HIGH (ref 65–99)

## 2016-04-27 SURGERY — ESOPHAGOGASTRODUODENOSCOPY (EGD) WITH PROPOFOL
Anesthesia: General

## 2016-04-27 MED ORDER — MIDAZOLAM HCL 2 MG/2ML IJ SOLN
INTRAMUSCULAR | Status: DC | PRN
  Administered 2016-04-27: 1 mg via INTRAVENOUS

## 2016-04-27 MED ORDER — IPRATROPIUM-ALBUTEROL 0.5-2.5 (3) MG/3ML IN SOLN
RESPIRATORY_TRACT | Status: AC
  Filled 2016-04-27: qty 3

## 2016-04-27 MED ORDER — PROPOFOL 500 MG/50ML IV EMUL
INTRAVENOUS | Status: DC | PRN
  Administered 2016-04-27: 75 ug/kg/min via INTRAVENOUS

## 2016-04-27 MED ORDER — FENTANYL CITRATE (PF) 100 MCG/2ML IJ SOLN
INTRAMUSCULAR | Status: DC | PRN
  Administered 2016-04-27: 50 ug via INTRAVENOUS

## 2016-04-27 MED ORDER — SODIUM CHLORIDE 0.9 % IV SOLN
INTRAVENOUS | Status: DC
  Administered 2016-04-27: 1000 mL via INTRAVENOUS

## 2016-04-27 MED ORDER — LACTATED RINGERS IV SOLN
INTRAVENOUS | Status: DC | PRN
  Administered 2016-04-27: 09:00:00 via INTRAVENOUS

## 2016-04-27 MED ORDER — PROPOFOL 10 MG/ML IV BOLUS
INTRAVENOUS | Status: DC | PRN
  Administered 2016-04-27: 100 mg via INTRAVENOUS

## 2016-04-27 MED ORDER — IPRATROPIUM-ALBUTEROL 0.5-2.5 (3) MG/3ML IN SOLN
3.0000 mL | Freq: Once | RESPIRATORY_TRACT | Status: AC
  Administered 2016-04-27: 3 mL via RESPIRATORY_TRACT

## 2016-04-27 MED ORDER — SODIUM CHLORIDE 0.9 % IV SOLN
INTRAVENOUS | Status: DC
Start: 2016-04-27 — End: 2016-04-27

## 2016-04-27 NOTE — Op Note (Signed)
[facility] Gastroenterology Patient Name: Caitlin Bass Procedure Date: 04/27/2016 8:26 AM MRN: [id number] Account #: [id number] Date of Birth: 1951/11/27 Admit Type: Outpatient Age: 65 Room: [facility] ENDO ROOM 4 Gender: Female Note Status: Finalized Procedure:            Colonoscopy Indications:          Iron deficiency anemia Providers:            Manya Silvas, MD Referring MD:         Daniel Nones, MD (Referring MD) Medicines:            Propofol per Anesthesia Complications:        No immediate complications. Procedure:            Pre-Anesthesia Assessment:                       - After reviewing the risks and benefits, the patient                        was deemed in satisfactory condition to undergo the                        procedure.                       After obtaining informed consent, the colonoscope was                        passed under direct vision. Throughout the procedure,                        the patient's blood pressure, pulse, and oxygen                        saturations were monitored continuously. The                        Colonoscope was introduced through the anus and                        advanced to the the cecum, identified by appendiceal                        orifice and ileocecal valve. The colonoscopy was                        performed without difficulty. The patient tolerated the                        procedure well. The quality of the bowel preparation                        was good. Findings:      A small polyp was found in the sigmoid colon. The polyp was sessile. The       polyp was removed with a hot snare. Resection and retrieval were       complete.      Internal hemorrhoids were found during endoscopy. The hemorrhoids were       small and Grade I (internal hemorrhoids that do not prolapse). A clip       was applied to  The polypectomy site to lessen the chance of delayed       bleeding.      Scattered  very small erythematous spots seen but of no significance. Impression:           - One small polyp in the sigmoid colon, removed with a                        hot snare. Resected and retrieved.                       - Internal hemorrhoids. Recommendation:       - Await pathology results. Manya Silvas, MD 04/27/2016 9:22:26 AM This report has been signed electronically. Number of Addenda: 0 Note Initiated On: 04/27/2016 8:26 AM Scope Withdrawal Time: 0 hours 8 minutes 13 seconds  Total Procedure Duration: 0 hours 9 minutes 38 seconds       [facility]

## 2016-04-27 NOTE — Anesthesia Postprocedure Evaluation (Signed)
Anesthesia Post Note  Patient: Caitlin Bass  Procedure(s) Performed: Procedure(s) (LRB): ESOPHAGOGASTRODUODENOSCOPY (EGD) WITH PROPOFOL (N/A) COLONOSCOPY WITH PROPOFOL (N/A)  Patient location during evaluation: Endoscopy Anesthesia Type: General Level of consciousness: awake and alert Pain management: pain level controlled Vital Signs Assessment: post-procedure vital signs reviewed and stable Respiratory status: spontaneous breathing, nonlabored ventilation, respiratory function stable and patient connected to nasal cannula oxygen Cardiovascular status: blood pressure returned to baseline and stable Postop Assessment: no signs of nausea or vomiting Anesthetic complications: no    Last Vitals:  Filed Vitals:   04/27/16 0940 04/27/16 0950  BP: 116/82 100/57  Pulse: 77 72  Temp:    Resp: 19 18    Last Pain: There were no vitals filed for this visit.               Precious Haws Pat Sires

## 2016-04-27 NOTE — Op Note (Signed)
[facility] Gastroenterology Patient Name: Caitlin Bass Procedure Date: 04/27/2016 8:34 AM MRN: [id number] Account #: [id number] Date of Birth: [birth year] Admit Type: Outpatient Age: 65 Room: [facility] ENDO ROOM 4 Gender: Female Note Status: Finalized Procedure:            Upper GI endoscopy Indications:          Dysphagia, Heartburn, Follow-up of esophageal reflux Providers:            Manya Silvas, MD Referring MD:         Daniel Nones, MD (Referring MD) Medicines:            Propofol per Anesthesia Complications:        No immediate complications. Procedure:            Pre-Anesthesia Assessment:                       - After reviewing the risks and benefits, the patient                        was deemed in satisfactory condition to undergo the                        procedure.                       After obtaining informed consent, the endoscope was                        passed under direct vision. Throughout the procedure,                        the patient's blood pressure, pulse, and oxygen                        saturations were monitored continuously. The Endoscope                        was introduced through the mouth, and advanced to the                        second part of duodenum. The upper GI endoscopy was                        accomplished without difficulty. The patient tolerated                        the procedure well. Findings:      LA Grade A (one or more mucosal breaks less than 5 mm, not extending       between tops of 2 mucosal folds) esophagitis with no bleeding was found       40 cm from the incisors. A guidewire was placed and the scope was       withdrawn. Dilation was performed with a Savary dilator with no       resistance at 16 mm and 17 mm.      Diffuse nodular mucosa was found in the gastric body. Biopsies were       taken with a cold forceps for histology. Biopsies were taken with a cold       forceps for Helicobacter  pylori testing.  The examined duodenum was normal. Impression:           - LA Grade A reflux esophagitis. Dilated.                       - Nodular mucosa in the gastric body. Biopsied.                       - Normal examined duodenum. Recommendation:       - Await pathology results. Manya Silvas, MD 04/27/2016 9:06:07 AM This report has been signed electronically. Number of Addenda: 0 Note Initiated On: 04/27/2016 8:34 AM      [facility]

## 2016-04-27 NOTE — H&P (Signed)
Primary Care Physician:  [facility] Primary Gastroenterologist:  Dr. Vira Agar  Pre-Procedure History & Physical: HPI:  Caitlin Bass is a 65 y.o. female is here for an endoscopy and colonoscopy.   Past Medical History  Diagnosis Date  . Coronary artery disease     a. 03/2014 NSTEMI/Cath: LM nl, LAD 19m, 60d, D1 60, D2 nl, d3 40, LCX 100 w/ L->L collats, RCA non dom, min irregs, EF 55%-->Med Rx.  . Diabetes mellitus without complication ([place])   . Hypertensive heart disease     a. 03/2015 Echo: 55-60%, mild conc LVH.  . CKD (chronic kidney disease), stage IV ([place])   . GERD (gastroesophageal reflux disease)   . DDD (degenerative disc disease)     a. s/p upper back surgery spring 2015.  Marland Kitchen Arthritis   . Hiatal hernia   . Hyperlipidemia   . Syncope and collapse     a. Early 03/2014->did not seek medical attn.  . Anxiety   . Fibromyalgia   . Anemia   . Diverticulosis   . Tobacco abuse     Past Surgical History  Procedure Laterality Date  . Neck surgery      Degenerative Disk Disease and removal of a spinal cyst  . Cardiac catheterization  06/2005    [facility]  . Cardiac catheterization  04/2014    [facility]  . Tubal ligation    . Breast cyst aspiration Right 2005    NEG  . Coronary angioplasty    . Dilatation & curettage/hysteroscopy with myosure N/A 09/18/2015    Procedure: DILATATION & CURETTAGE/HYSTEROSCOPY WITH MYOSURE;  Surgeon: Boykin Nearing, MD;  Location: [facility] ORS;  Service: Gynecology;  Laterality: N/A;    Prior to Admission medications   Medication Sig Start Date End Date Taking? Authorizing Provider  allopurinol (ZYLOPRIM) 100 MG tablet Take 100 mg by mouth 2 (two) times daily.   Yes Historical Provider, MD  amLODipine (NORVASC) 5 MG tablet Take 1 tablet (5 mg total) by mouth daily. 10/26/15  Yes Wellington Hampshire, MD  aspirin 81 MG chewable tablet Chew 81 mg by mouth daily.   Yes Historical Provider, MD  atorvastatin (LIPITOR) 40 MG tablet  Take 40 mg by mouth daily at 6 PM.    Yes Historical Provider, MD  clopidogrel (PLAVIX) 75 MG tablet Take 1 tablet (75 mg total) by mouth daily. 04/21/15  Yes Wellington Hampshire, MD  cyclobenzaprine (FLEXERIL) 5 MG tablet Take 5 mg by mouth as needed for muscle spasms.   Yes Historical Provider, MD  insulin glargine (LANTUS) 100 UNIT/ML injection Inject 35 Units into the skin every morning. 44 units at bedtime   Yes Historical Provider, MD  Insulin Lispro, Human, (HUMALOG KWIKPEN [place]) Inject 8 Units into the skin every morning. 28 units at lunch and supper   Yes Historical Provider, MD  lisinopril (PRINIVIL,ZESTRIL) 5 MG tablet Take 5 mg by mouth at bedtime.   Yes Historical Provider, MD  magnesium oxide (MAG-OX) 400 MG tablet Take 400 mg by mouth 2 (two) times daily.   Yes Historical Provider, MD  meloxicam (MOBIC) 15 MG tablet Take 15 mg by mouth daily.   Yes Historical Provider, MD  metoprolol tartrate (LOPRESSOR) 25 MG tablet Take 1 tablet (25 mg total) by mouth 2 (two) times daily. 03/07/16  Yes Wellington Hampshire, MD  Omega-3 Fatty Acids (FISH OIL MAXIMUM STRENGTH) 1200 MG CAPS  02/10/16  Yes Historical Provider, MD  ranitidine (ZANTAC) 150 MG tablet  Take 150 mg by mouth 2 (two) times daily.   Yes Historical Provider, MD  triamcinolone cream (KENALOG) 0.1 % Apply 1 application topically 2 (two) times daily.   Yes Historical Provider, MD    Allergies as of 04/14/2016 - Review Complete 02/26/2016  Allergen Reaction Noted  . Aspirin Other (See Comments) 04/29/2014  . Hydrochlorothiazide Itching 04/29/2014    Family History  Problem Relation Age of Onset  . Heart disease Mother   . Heart attack Mother   . Hypertension Mother   . Hyperlipidemia Mother   . Breast cancer Sister     Social History   Social History  . Marital Status: Widowed    Spouse Name: N/A  . Number of Children: N/A  . Years of Education: N/A   Occupational History  . Not on file.   Social History Main Topics  .  Smoking status: Current Every Day Smoker -- 0.25 packs/day for 40 years    Types: Cigarettes  . Smokeless tobacco: Never Used  . Alcohol Use: No  . Drug Use: No  . Sexual Activity: Not on file   Other Topics Concern  . Not on file   Social History Narrative    Review of Systems: See HPI, otherwise negative ROS  Physical Exam: BP 130/75 mmHg  Pulse 65  Temp(Src) 96.2 F (35.7 C) (Tympanic)  Resp 16  Ht 5\' 4"  (1.626 m)  Wt 72.576 kg (160 lb)  BMI 27.45 kg/m2  SpO2 99% General:   Alert,  pleasant and cooperative in NAD Head:  Normocephalic and atraumatic. Neck:  Supple; no masses or thyromegaly. Lungs:  Clear throughout to auscultation.    Heart:  Regular rate and rhythm. Abdomen:  Soft, nontender and nondistended. Normal bowel sounds, without guarding, and without rebound.   Neurologic:  Alert and  oriented x4;  grossly normal neurologically.  Impression/Plan: [name] is here for an endoscopy and colonoscopy to be performed for Iron def anemia, GERD, dysphagia  Risks, benefits, limitations, and alternatives regarding  endoscopy and colonoscopy have been reviewed with the patient.  Questions have been answered.  All parties agreeable.   Gaylyn Cheers, MD  04/27/2016, 8:49 AM

## 2016-04-27 NOTE — Anesthesia Preprocedure Evaluation (Signed)
Anesthesia Evaluation  Patient identified by MRN, date of birth, ID band Patient awake    Reviewed: Allergy & Precautions, H&P , NPO status , Patient's Chart, lab work & pertinent test results  History of Anesthesia Complications Negative for: history of anesthetic complications  Airway Mallampati: III  TM Distance: <3 FB Neck ROM: limited    Dental  (+) Poor Dentition, Chipped, Missing, Upper Dentures   Pulmonary neg shortness of breath, Current Smoker,    Pulmonary exam normal breath sounds clear to auscultation       Cardiovascular Exercise Tolerance: Good hypertension, (-) angina+ CAD, + Past MI and + Cardiac Stents  (-) DOE Normal cardiovascular exam Rhythm:regular Rate:Normal     Neuro/Psych PSYCHIATRIC DISORDERS Anxiety  Neuromuscular disease    GI/Hepatic Neg liver ROS, hiatal hernia, GERD  Controlled and Medicated,  Endo/Other  diabetes, Type 2, Insulin Dependent  Renal/GU Renal disease  negative genitourinary   Musculoskeletal  (+) Arthritis , Fibromyalgia -  Abdominal   Peds  Hematology negative hematology ROS (+)   Anesthesia Other Findings Past Medical History:   Coronary artery disease                                        Comment:a. 03/2014 NSTEMI/Cath: LM nl, LAD 65m, 60d, D1               60, D2 nl, d3 40, LCX 100 w/ L->L collats, RCA               non dom, min irregs, EF 55%-->Med Rx.   Diabetes mellitus without complication ([place])                 Hypertensive heart disease                                     Comment:a. 03/2015 Echo: 55-60%, mild conc LVH.   CKD (chronic kidney disease), stage IV ([facility])                 GERD (gastroesophageal reflux disease)                       DDD (degenerative disc disease)                                Comment:a. s/p upper back surgery spring 2015.   Arthritis                                                    Hiatal hernia                                                 Hyperlipidemia                                               Syncope and  collapse                                           Comment:a. Early 03/2014->did not seek medical attn.   Anxiety                                                      Fibromyalgia                                                 Anemia                                                       Diverticulosis                                               Tobacco abuse                                               Past Surgical History:   NECK SURGERY                                                    Comment:Degenerative Disk Disease and removal of a               spinal cyst   CARDIAC CATHETERIZATION                          06/2005         Comment:[facility]   CARDIAC CATHETERIZATION                          04/2014         Comment:[facility]   TUBAL LIGATION                                                BREAST CYST ASPIRATION                          Right 2005           Comment:NEG   CORONARY ANGIOPLASTY  DILATATION & CURETTAGE/HYSTEROSCOPY WITH MYOSU* N/A 09/18/2015     Comment:Procedure: DILATATION & CURETTAGE/HYSTEROSCOPY               WITH MYOSURE;  Surgeon: Boykin Nearing,               MD;  Location: [facility] ORS;  Service: Gynecology;               Laterality: N/A;  BMI    Body Mass Index   27.45 kg/m 2      Reproductive/Obstetrics negative OB ROS                             Anesthesia Physical Anesthesia Plan  ASA: III  Anesthesia Plan: General   Post-op Pain Management:    Induction:   Airway Management Planned:   Additional Equipment:   Intra-op Plan:   Post-operative Plan:   Informed Consent: I have reviewed the patients History and Physical, chart, labs and discussed the procedure including the risks, benefits and alternatives for the proposed anesthesia with the patient or authorized representative who has indicated  his/her understanding and acceptance.   Dental Advisory Given  Plan Discussed with: Anesthesiologist, CRNA and Surgeon  Anesthesia Plan Comments:         Anesthesia Quick Evaluation

## 2016-04-27 NOTE — Transfer of Care (Signed)
Immediate Anesthesia Transfer of Care Note  Patient: Caitlin Bass  Procedure(s) Performed: Procedure(s): ESOPHAGOGASTRODUODENOSCOPY (EGD) WITH PROPOFOL (N/A) COLONOSCOPY WITH PROPOFOL (N/A)  Patient Location: PACU  Anesthesia Type:General  Level of Consciousness: awake and alert   Airway & Oxygen Therapy: Patient Spontanous Breathing and Patient connected to nasal cannula oxygen  Post-op Assessment: Report given to RN  Post vital signs: Reviewed and stable  Last Vitals:  Filed Vitals:   04/27/16 0750 04/27/16 0919  BP: 130/75 114/66  Pulse: 65 85  Temp: 35.7 C 36.4 C  Resp: 16 20    Last Pain: There were no vitals filed for this visit.       Complications: No apparent anesthesia complications

## 2016-04-27 NOTE — Anesthesia Procedure Notes (Signed)
Performed by: Santia Labate Oxygen Delivery Method: Nasal cannula     

## 2016-04-28 ENCOUNTER — Encounter: Payer: Self-pay | Admitting: Unknown Physician Specialty

## 2016-04-28 LAB — SURGICAL PATHOLOGY

## 2016-05-02 NOTE — H&P (Addendum)
Caitlin Bass is a 65 y.o. female here for [facility] and BSO  .pt is s/p a myosure endometrial resection ( path - benign) by me in Oct 2016 . No bleeding until one month ago and now is daily . Pt also c/o of back pain and leg pain .  PAp nl , u/s nl( 4 small fibroids noted) , embx benign    Past Medical History:  has a past medical history of Anemia, unspecified; Aneurysm (CMS-HCC); B12 deficiency; CAD (coronary artery disease); Cervical scoliosis; Diabetes mellitus type 2, uncomplicated (CMS-HCC); Hypertension; and Insomnia.  Past Surgical History:  has a past surgical history that includes Breast surgery (Right); other surgery; other surgery; and Fx D&C and Myosure. Family History: family history includes Heart disease in her mother. Social History:  reports that she has been smoking.  She has been smoking about 0.25 packs per day. She does not have any smokeless tobacco history on file. She reports that she does not drink alcohol or use illicit drugs. OB/GYN History:  OB History    Gravida Para Term Preterm AB TAB SAB Ectopic Multiple Living   '1    1  1         ' Allergies: is allergic to aspirin and hydrochlorothiazide. Medications:  Current Outpatient Prescriptions:  .  allopurinol (ZYLOPRIM) 100 MG tablet, Take 200 mg by mouth., Disp: , Rfl:  .  amLODIPine (NORVASC) 5 MG tablet, Take by mouth., Disp: , Rfl:  .  aspirin 81 MG EC tablet, Take 81 mg by mouth once daily., Disp: , Rfl:  .  atorvastatin (LIPITOR) 40 MG tablet, Take 40 mg by mouth once daily., Disp: , Rfl:  .  blood glucose diagnostic test strip, Use 3 (three) times daily. Use as instructed., Disp: , Rfl:  .  blood glucose meter kit, Use as directed., Disp: , Rfl:  .  clobetasol (TEMOVATE) 0.05 % cream, Apply topically 2 (two) times daily., Disp: , Rfl:  .  clopidogrel (PLAVIX) 75 mg tablet, Take 75 mg by mouth once daily., Disp: , Rfl:  .  colchicine (COLCRYS) 0.6 mg tablet, Take 1 tablet twice daily for 3 days then 1  tablet daily., Disp: , Rfl:  .  cromolyn (CROLOM) 4 % ophthalmic solution, 1 drop 4 (four) times daily., Disp: , Rfl:  .  cyclobenzaprine (FLEXERIL) 5 MG tablet, Take 5 mg by mouth 3 (three) times daily as needed for Muscle spasms., Disp: , Rfl:  .  dextrose 1 gram Chew, Take by mouth., Disp: , Rfl:  .  ERGOCALCIFEROL, VITAMIN D2, (VITAMIN D2 ORAL), Take by mouth. Reported on 04/05/2016 , Disp: , Rfl:  .  fluticasone (FLONASE) 50 mcg/actuation nasal spray, Reported on 04/05/2016, Disp: , Rfl:  .  insulin ASPART (NOVOLOG) 100 unit/mL injection, Inject subcutaneously., Disp: , Rfl:  .  insulin GLARGINE (LANTUS) 100 unit/mL injection, Inject subcutaneously., Disp: , Rfl:  .  insulin GLARGINE (LANTUS) 100 unit/mL injection, Inject subcutaneously nightly. 35 units in am and 40 units in pm, Disp: , Rfl:  .  isosorbide mononitrate (IMDUR) 30 MG ER tablet, Take 30 mg by mouth once daily., Disp: , Rfl:  .  lancets, Use 1 each 3 (three) times daily. Use as instructed., Disp: , Rfl:  .  lisinopril (PRINIVIL,ZESTRIL) 5 MG tablet, Take 5 mg by mouth., Disp: , Rfl:  .  magnesium oxide (MAG-OX) 400 mg tablet, Take 400 mg by mouth once daily. Reported on 04/05/2016 , Disp: , Rfl:  .  meloxicam ([place])  15 MG tablet, Take 1 tablet (15 mg total) by mouth once daily., Disp: 30 tablet, Rfl: 1 .  nicotine (NICOTROL) 10 mg cartridge for inhalation, Inhale 1 inhalation into the lungs as needed for Smoking cessation., Disp: , Rfl:  .  nicotine polacrilex (NICORETTE) 2 mg gum, Take 2 mg by mouth as needed for Smoking cessation. Chew slowly until it tingles, then park gum between cheek and gum until tingle is gone; repeat process until most of the tingle is gone (~30 minutes)., Disp: , Rfl:  .  nitroGLYcerin (NITROSTAT) 0.4 MG SL tablet, Place 0.4 mg under the tongue every 5 (five) minutes as needed for Chest pain. Reported on 04/05/2016 , Disp: , Rfl:  .  omega-3 fatty acids-fish oil (FISH OIL) 360-1,200 mg Cap, , Disp: , Rfl:  .   omeprazole (PRILOSEC) 20 MG DR capsule, Take 1 capsule (20 mg total) by mouth 2 (two) times daily., Disp: 60 capsule, Rfl: 1 .  triamcinolone 0.1 % ointment, Apply topically 2 (two) times daily., Disp: , Rfl:  .  cholecalciferol (VITAMIN D3) 2,000 unit tablet, Take 2,000 Units by mouth once daily. Reported on 04/05/2016 , Disp: , Rfl:  .  insulin LISPRO (HUMALOG) injection, Inject subcutaneously 3 (three) times daily with meals. 8 units in am, 28 units at lunch, 28 units with supper, Disp: , Rfl:  .  metroNIDAZOLE (FLAGYL) 500 MG tablet, Take 1 tablet (500 mg total) by mouth 2 (two) times daily., Disp: 14 tablet, Rfl: 0  Review of Systems: General:                                          No fatigue or weight loss Eyes:                                                         No vision changes Ears:                                                          No hearing difficulty Respiratory:                No cough or shortness of breath Pulmonary:                                      No asthma or shortness of breath Cardiovascular:                     No chest pain, palpitations, dyspnea on exertion Gastrointestinal:                    No abdominal bloating, chronic diarrhea, constipations, masses, pain or hematochezia Genitourinary:                                 No hematuria, dysuria, abnormal vaginal discharge, pelvic pain, Menometrorrhagia, + PMB  Lymphatic:  No swollen lymph nodes Musculoskeletal:                   No muscle weakness Neurologic:                                      No extremity weakness, syncope, seizure disorder Psychiatric:                                      No history of depression, delusions or suicidal/homicidal ideation    Exam:      Vitals:   05/03/16  BP: 125/69  Pulse: 78    Body mass index is 27.98 kg/(m^2).  WDWN black  in NAD   Lungs: CTA  CV : RRR without murmur   Neck:  no thyromegaly Abdomen: soft , no  mass, normal active bowel sounds,  non-tender, no rebound tenderness Pelvic: tanner stage 5 ,  External genitalia: vulva /labia no lesions Urethra: no prolapse Vagina: grey d/c . Wet mount : + trich  Cervix: no lesions, no cervical motion tenderness   Uterus: normal size shape and contour, non-tender Adnexa: no mass,  non-tender   Endometrial biopsy: The cervix was cleaned with betadine and a single tooth tenaculum is applied to the anterior cervix. The [place] catheter was placed into the endometrial cavity. It sounds to 7 cm and adequate tissue was removed.  Impression:   The primary encounter diagnosis was PMB (postmenopausal bleeding). Previous resection of endometrial mass 08/2015., Plan:  LSH and bilateral salpingoophorectomy . U/s        Orders Placed This Encounter  Procedures  . Chlamydia/GC Amplification - [facility]  . Wet Prep  . Rapid HIV  . RPR - [facility]  . Pap IG (Image Guided) - [facility]    Order Specific Question:   [facility] Specimen source for cytology test:    Answer:   Cervical    Order Specific Question:   [facility] Collection method:    Answer:   [place]  . Pathology Report - [facility]    Endometrial Biopsy    Return if symptoms worsen or fail to improve, for preop.  Caroline Sauger, MD

## 2016-05-03 ENCOUNTER — Inpatient Hospital Stay: Admission: RE | Admit: 2016-05-03 | Payer: Medicaid Other | Source: Ambulatory Visit

## 2016-05-09 ENCOUNTER — Encounter: Admission: RE | Payer: Self-pay | Source: Ambulatory Visit

## 2016-05-09 ENCOUNTER — Ambulatory Visit
Admission: RE | Admit: 2016-05-09 | Payer: Medicaid Other | Source: Ambulatory Visit | Admitting: Obstetrics and Gynecology

## 2016-05-09 SURGERY — HYSTERECTOMY, SUPRACERVICAL, LAPAROSCOPIC
Anesthesia: General

## 2016-05-17 ENCOUNTER — Encounter (INDEPENDENT_AMBULATORY_CARE_PROVIDER_SITE_OTHER): Payer: Self-pay

## 2016-05-17 ENCOUNTER — Encounter: Payer: Self-pay | Admitting: Cardiovascular Disease

## 2016-05-17 ENCOUNTER — Ambulatory Visit (INDEPENDENT_AMBULATORY_CARE_PROVIDER_SITE_OTHER): Payer: Medicaid Other | Admitting: Cardiovascular Disease

## 2016-05-17 VITALS — BP 118/64 | HR 104 | Ht 64.0 in | Wt 167.1 lb

## 2016-05-17 DIAGNOSIS — I2510 Atherosclerotic heart disease of native coronary artery without angina pectoris: Secondary | ICD-10-CM

## 2016-05-17 NOTE — Patient Instructions (Signed)
Medication Instructions:  Your physician has recommended you make the following change in your medication:  STOP taking Plavix   Labwork: none  Testing/Procedures: none  Follow-Up: Your physician wants you to follow-up in: six months with Dr. Arida.  You will receive a reminder letter in the mail two months in advance. If you don't receive a letter, please call our office to schedule the follow-up appointment.   Any Other Special Instructions Will Be Listed Below (If Applicable).     If you need a refill on your cardiac medications before your next appointment, please call your pharmacy.   

## 2016-05-17 NOTE — Progress Notes (Signed)
Cardiology Office Note   Date:  05/17/2016   ID:  Caitlin Bass, DOB 10-18-1951, MRN [id number]  PCP:  [place]  Cardiologist:   Kathlyn Sacramento, MD   Chief Complaint  Patient presents with  . Follow-up    SWELLING IN FEET, NO CP OR SOB. NO OTHER COMPLAINTS.      History of Present Illness: Caitlin Bass is a 65 y.o. female who presents for a followup visit regarding coronary artery disease.  She had non-ST elevation myocardial infarction in May 2015. Echo showed nl LV fxn. Cardiac catheterization showed an occluded LCX with L-L collats and otherwise nonobstructive CAD. She was medically managed . She was most recently seen in November 2016. At that time, she was having symptoms of dizziness and stopped taking carvedilol and Imdur. Chlorthalidone was discontinued due to gout. During last visit, I started her on metoprolol 25 mg twice daily and amlodipine 5 mg once daily due to significantly elevated blood pressure. 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 She was treated with IV iron but was found to have heme positive stools. She was referred to gastroenterology. She underwent EGD and colonoscopy which showed a small polyp and internal hemorrhoids. She reports significant improvement in dizziness after switching her blood pressure medications. However, she ran out of metoprolol 2 days ago which might explain the mild tachycardia today. She denies any chest pain or shortness of breath.    Past Medical History  Diagnosis Date  . Coronary artery disease     a. 03/2014 NSTEMI/Cath: LM nl, LAD 36m, 60d, D1 60, D2 nl, d3 40, LCX 100 w/ L->L collats, RCA non dom, min irregs, EF 55%-->Med Rx.  . Diabetes mellitus without complication ([place])   . Hypertensive heart disease     a. 03/2015 Echo: 55-60%, mild conc LVH.  . CKD (chronic kidney disease), stage IV ([place])   . GERD (gastroesophageal reflux disease)   . DDD  (degenerative disc disease)     a. s/p upper back surgery spring 2015.  Marland Kitchen Arthritis   . Hiatal hernia   . Hyperlipidemia   . Syncope and collapse     a. Early 03/2014->did not seek medical attn.  . Anxiety   . Fibromyalgia   . Anemia   . Diverticulosis   . Tobacco abuse     Past Surgical History  Procedure Laterality Date  . Neck surgery      Degenerative Disk Disease and removal of a spinal cyst  . Cardiac catheterization  06/2005    [facility]  . Cardiac catheterization  04/2014    [facility]  . Tubal ligation    . Breast cyst aspiration Right 2005    NEG  . Coronary angioplasty    . Dilatation & curettage/hysteroscopy with myosure N/A 09/18/2015    Procedure: DILATATION & CURETTAGE/HYSTEROSCOPY WITH MYOSURE;  Surgeon: Boykin Nearing, MD;  Location: [facility] ORS;  Service: Gynecology;  Laterality: N/A;  . Esophagogastroduodenoscopy (egd) with propofol N/A 04/27/2016    Procedure: ESOPHAGOGASTRODUODENOSCOPY (EGD) WITH PROPOFOL;  Surgeon: Manya Silvas, MD;  Location: [facility] ENDOSCOPY;  Service: Endoscopy;  Laterality: N/A;  . Colonoscopy with propofol N/A 04/27/2016    Procedure: COLONOSCOPY WITH PROPOFOL;  Surgeon: Manya Silvas, MD;  Location: [facility] ENDOSCOPY;  Service: Endoscopy;  Laterality: N/A;     Current Outpatient Prescriptions  Medication Sig Dispense Refill  . allopurinol (ZYLOPRIM) 100 MG tablet Take 100 mg by mouth  2 (two) times daily.    Marland Kitchen amLODipine (NORVASC) 5 MG tablet Take 1 tablet (5 mg total) by mouth daily. 30 tablet 3  . aspirin 81 MG chewable tablet Chew 81 mg by mouth daily.    Marland Kitchen atorvastatin (LIPITOR) 40 MG tablet Take 40 mg by mouth daily at 6 PM.     . cetirizine (ZYRTEC) 10 MG tablet Take 10 mg by mouth daily.    . insulin glargine (LANTUS) 100 UNIT/ML injection Inject 35 Units into the skin every morning. 44 units at bedtime    . Insulin Lispro, Human, (HUMALOG KWIKPEN [place]) Inject 8 Units into the skin every morning. 28 units at lunch and supper    .  lisinopril (PRINIVIL,ZESTRIL) 5 MG tablet Take 5 mg by mouth at bedtime.    . magnesium oxide (MAG-OX) 400 MG tablet Take 400 mg by mouth 2 (two) times daily.    . metoprolol tartrate (LOPRESSOR) 25 MG tablet Take 1 tablet (25 mg total) by mouth 2 (two) times daily. 60 tablet 3  . Omega-3 Fatty Acids (FISH OIL MAXIMUM STRENGTH) 1200 MG CAPS Take 1,000 mg by mouth 2 (two) times daily.   0  . ranitidine (ZANTAC) 150 MG tablet Take 150 mg by mouth 2 (two) times daily.    Marland Kitchen triamcinolone cream (KENALOG) 0.1 % Apply 1 application topically 2 (two) times daily.    . cyclobenzaprine (FLEXERIL) 5 MG tablet Take 5 mg by mouth as needed for muscle spasms. Reported on 05/17/2016    . meloxicam (MOBIC) 15 MG tablet Take 15 mg by mouth daily. Reported on 05/17/2016     No current facility-administered medications for this visit.    Allergies:   Aspirin and Hydrochlorothiazide    Social History:  The patient  reports that she has been smoking Cigarettes.  She has a 10 pack-year smoking history. She has never used smokeless tobacco. She reports that she does not drink alcohol or use illicit drugs.   Family History:  The patient's family history includes Breast cancer in her sister; Heart attack in her mother; Heart disease in her mother; Hyperlipidemia in her mother; Hypertension in her mother.    ROS:  Please see the history of present illness.   Otherwise, review of systems are positive for none.   All other systems are reviewed and negative.    PHYSICAL EXAM: VS:  BP 118/64 mmHg  Pulse 104  Ht 5\' 4"  (1.626 m)  Wt 167 lb 1.9 oz (75.805 kg)  BMI 28.67 kg/m2 , BMI Body mass index is 28.67 kg/(m^2). GEN: Well nourished, well developed, in no acute distress HEENT: normal Neck: no JVD, carotid bruits, or masses Cardiac: RRR; no  rubs, or gallops,no edema . 2/6 SEM at the aortic area.  Respiratory:  clear to auscultation bilaterally, normal work of breathing GI: soft, nontender, nondistended, + BS MS:  no deformity or atrophy Skin: warm and dry, no rash Neuro:  Strength and sensation are intact Psych: euthymic mood, full affect   EKG:  EKG is ordered today. The ekg ordered today demonstrates sinus tachycardia with left atrial enlargement and lateral T wave changes suggestive of ischemia.   Recent Labs: 02/26/2016: ALT 22 04/22/2016: BUN 30*; Creatinine, Ser 1.71*; Hemoglobin 12.4; Platelets 326; Potassium 4.6; Sodium 137    Lipid Panel    Component Value Date/Time   CHOL 291* 07/23/2013 0429   TRIG 550* 07/23/2013 0429   HDL 37* 07/23/2013 0429   VLDL SEE COMMENT 07/23/2013 0429  [place] SEE COMMENT 07/23/2013 0429      Wt Readings from Last 3 Encounters:  05/17/16 167 lb 1.9 oz (75.805 kg)  04/27/16 160 lb (72.576 kg)  04/22/16 169 lb 8.5 oz (76.9 kg)        ASSESSMENT AND PLAN:  1.  Coronary artery disease involving native coronary arteries without angina: It has been more than one year since her myocardial infarction. Thus, I decided to discontinue Plavix. Continue low-dose aspirin.  2. Essential hypertension: Blood pressure is controlled. Mild sinus tachycardia is likely due to rebound tachycardia from missing metoprolol. She got the medication back today.  3. Hyperlipidemia: Currently on atorvastatin 40 mg once daily. I'm not sure if her primary care physician is following this she will need a follow-up lipid and liver profile.  4. Chronic kidney disease: Followed by nephrology.    Disposition:   FU with me in 6 months  Signed,  Kathlyn Sacramento, MD  05/17/2016 4:22 PM    [facility]

## 2016-06-21 ENCOUNTER — Emergency Department: Payer: Medicaid Other

## 2016-06-21 ENCOUNTER — Emergency Department
Admission: EM | Admit: 2016-06-21 | Discharge: 2016-06-21 | Disposition: A | Discharge status: Home / Self Care | Payer: Medicaid Other | Attending: Emergency Medicine | Admitting: Emergency Medicine

## 2016-06-21 ENCOUNTER — Encounter: Payer: Self-pay | Admitting: Emergency Medicine

## 2016-06-21 DIAGNOSIS — M1990 Unspecified osteoarthritis, unspecified site: Secondary | ICD-10-CM | POA: Diagnosis not present

## 2016-06-21 DIAGNOSIS — N184 Chronic kidney disease, stage 4 (severe): Secondary | ICD-10-CM | POA: Insufficient documentation

## 2016-06-21 DIAGNOSIS — M436 Torticollis: Secondary | ICD-10-CM

## 2016-06-21 DIAGNOSIS — I130 Hypertensive heart and chronic kidney disease with heart failure and stage 1 through stage 4 chronic kidney disease, or unspecified chronic kidney disease: Secondary | ICD-10-CM | POA: Diagnosis not present

## 2016-06-21 DIAGNOSIS — Z79899 Other long term (current) drug therapy: Secondary | ICD-10-CM | POA: Insufficient documentation

## 2016-06-21 DIAGNOSIS — F17210 Nicotine dependence, cigarettes, uncomplicated: Secondary | ICD-10-CM | POA: Insufficient documentation

## 2016-06-21 DIAGNOSIS — Z794 Long term (current) use of insulin: Secondary | ICD-10-CM | POA: Insufficient documentation

## 2016-06-21 DIAGNOSIS — I509 Heart failure, unspecified: Secondary | ICD-10-CM | POA: Diagnosis not present

## 2016-06-21 DIAGNOSIS — E785 Hyperlipidemia, unspecified: Secondary | ICD-10-CM | POA: Diagnosis not present

## 2016-06-21 DIAGNOSIS — I2510 Atherosclerotic heart disease of native coronary artery without angina pectoris: Secondary | ICD-10-CM | POA: Insufficient documentation

## 2016-06-21 DIAGNOSIS — Z7982 Long term (current) use of aspirin: Secondary | ICD-10-CM | POA: Diagnosis not present

## 2016-06-21 DIAGNOSIS — E1122 Type 2 diabetes mellitus with diabetic chronic kidney disease: Secondary | ICD-10-CM | POA: Diagnosis not present

## 2016-06-21 MED ORDER — HYDROCODONE-ACETAMINOPHEN 5-325 MG PO TABS: ORAL_TABLET | ORAL | 0 refills | Status: DC

## 2016-06-21 MED ORDER — DIAZEPAM 5 MG PO TABS
5.0000 mg | ORAL_TABLET | Freq: Once | ORAL | Status: AC
  Administered 2016-06-21: 5 mg via ORAL
  Filled 2016-06-21: qty 1

## 2016-06-21 MED ORDER — OXYCODONE-ACETAMINOPHEN 5-325 MG PO TABS
1.0000 | ORAL_TABLET | Freq: Once | ORAL | Status: AC
  Administered 2016-06-21: 1 via ORAL
  Filled 2016-06-21: qty 1

## 2016-06-21 MED ORDER — DIAZEPAM 2 MG PO TABS: 2.0000 mg | ORAL_TABLET | Freq: Three times a day (TID) | ORAL | 0 refills | Status: DC | PRN

## 2016-06-21 NOTE — ED Triage Notes (Signed)
Pt reports right sided neck pain upon waking this morning; denies injury. Pt with hx of the same pain.

## 2016-06-21 NOTE — ED Provider Notes (Signed)
[facility] Emergency Department Provider Note   ____________________________________________  Time seen: Approximately 8:51 AM  I have reviewed the triage vital signs and the nursing notes.   HISTORY  Chief Complaint Torticollis   HPI Caitlin Bass is a 65 y.o. female is here with complaint of right-sided neck pain when she woke up this morning. Patient has history of the same problem and it was approximately 6 months ago. She denies any recent injury. She denies any numbness in her upper extremities. Patient has taken over-the-counter medication at home without any relief. Patient complains of difficulty with movement of her neck but no severe pain with sitting still. Currently she rates her pain as 5/10. Nothing is helping her pain and movement increases her pain.      Past Medical History:  Diagnosis Date  . Anemia   . Anxiety   . Arthritis   . CKD (chronic kidney disease), stage IV ([place])   . Coronary artery disease    a. 03/2014 NSTEMI/Cath: LM nl, LAD 66m, 60d, D1 60, D2 nl, d3 40, LCX 100 w/ L->L collats, RCA non dom, min irregs, EF 55%-->Med Rx.  . DDD (degenerative disc disease)    a. s/p upper back surgery spring 2015.  . Diabetes mellitus without complication ([place])   . Diverticulosis   . Fibromyalgia   . GERD (gastroesophageal reflux disease)   . Hiatal hernia   . Hyperlipidemia   . Hypertensive heart disease    a. 03/2015 Echo: 55-60%, mild conc LVH.  Marland Kitchen Syncope and collapse    a. Early 03/2014->did not seek medical attn.  . Tobacco abuse     Patient Active Problem List   Diagnosis Date Noted  . Other iron deficiency anemias 02/26/2016  . Hypertensive heart disease   . Tobacco abuse   . Hyperlipidemia   . Coronary artery disease   . Diabetes mellitus without complication ([place])   . Hypertension   . CKD (chronic kidney disease), stage IV ([place])   . GERD (gastroesophageal reflux disease)   . DDD (degenerative disc disease)       Past Surgical History:  Procedure Laterality Date  . BREAST CYST ASPIRATION Right 2005   NEG  . CARDIAC CATHETERIZATION  06/2005   [facility]  . CARDIAC CATHETERIZATION  04/2014   [facility]  . COLONOSCOPY WITH PROPOFOL N/A 04/27/2016   Procedure: COLONOSCOPY WITH PROPOFOL;  Surgeon: Manya Silvas, MD;  Location: [facility] ENDOSCOPY;  Service: Endoscopy;  Laterality: N/A;  . CORONARY ANGIOPLASTY    . DILATATION & CURETTAGE/HYSTEROSCOPY WITH MYOSURE N/A 09/18/2015   Procedure: DILATATION & CURETTAGE/HYSTEROSCOPY WITH MYOSURE;  Surgeon: Boykin Nearing, MD;  Location: [facility] ORS;  Service: Gynecology;  Laterality: N/A;  . ESOPHAGOGASTRODUODENOSCOPY (EGD) WITH PROPOFOL N/A 04/27/2016   Procedure: ESOPHAGOGASTRODUODENOSCOPY (EGD) WITH PROPOFOL;  Surgeon: Manya Silvas, MD;  Location: [facility] ENDOSCOPY;  Service: Endoscopy;  Laterality: N/A;  . NECK SURGERY     Degenerative Disk Disease and removal of a spinal cyst  . TUBAL LIGATION      Current Outpatient Rx  . Order #: [id number] Class: Historical Med  . Order #: [id number] Class: Normal  . Order #: [id number] Class: Historical Med  . Order #: [id number] Class: Historical Med  . Order #: [id number] Class: Historical Med  . Order #: [id number] Class: Historical Med  . Order #: [id number] Class: Print  . Order #: [id number] Class: Print  . Order #: [id number] Class: Historical Med  . Order #: [id number] Class: Historical Med  . Order #: [id number] Class: Historical  Med  . Order #: [id number] Class: Historical Med  . Order #: [id number] Class: Historical Med  . Order #: [id number] Class: Normal  . Order #: [id number] Class: Historical Med  . Order #: [id number] Class: Historical Med  . Order #: [id number] Class: Historical Med    Allergies Aspirin and Hydrochlorothiazide  Family History  Problem Relation Age of Onset  . Heart disease Mother   . Heart attack Mother   . Hypertension Mother   . Hyperlipidemia Mother   . Breast cancer Sister     Social  History Social History  Substance Use Topics  . Smoking status: Current Every Day Smoker    Packs/day: 0.25    Years: 40.00    Types: Cigarettes  . Smokeless tobacco: Never Used  . Alcohol use No    Review of Systems Constitutional: No fever/chills Eyes: No visual changes. ENT: No sore throat. Cardiovascular: Denies chest pain. Respiratory: Denies shortness of breath. Gastrointestinal:   No nausea, no vomiting.  Musculoskeletal: Negative for back pain. Positive neck pain. Skin: Negative for rash. Neurological: Negative for headaches, focal weakness or numbness.  10-point ROS otherwise negative.  ____________________________________________   PHYSICAL EXAM:  VITAL SIGNS: ED Triage Vitals [06/21/16 0821]  Enc Vitals Group     BP (!) 158/84     Pulse Rate 73     Resp 16     Temp 98.1 F (36.7 C)     Temp Source Oral     SpO2 95 %     Weight 165 lb (74.8 kg)     Height 5\' 3"  (1.6 m)     Head Circumference      Peak Flow      Pain Score 5     Pain Loc      Pain Edu?      Excl. in [place]?     Constitutional: Alert and oriented. Well appearing and in no acute distress. Eyes: Conjunctivae are normal. PERRL. EOMI. Head: Atraumatic. Nose: No congestion/rhinnorhea. Neck: No stridor.  Nontender cervical spine on palpation posteriorly. Range of motion is restricted secondary to patient's pain. Patient is able to move laterally in each direction approximately 30 without discomfort. Patient is able flex and extend with out any discomfort. Hematological/Lymphatic/Immunilogical: No cervical lymphadenopathy. Cardiovascular: Normal rate, regular rhythm. Grossly normal heart sounds.  Good peripheral circulation. Respiratory: Normal respiratory effort.  No retractions. Lungs CTAB. Musculoskeletal: Moves upper and lower extremities without any difficulty. Normal gait was noted. There is no gross deformity or active muscle spasms noted of the upper or lower back. Neurologic:  Normal  speech and language. No gross focal neurologic deficits are appreciated. No gait instability. Reflexes are 2+ bilaterally. Skin:  Skin is warm, dry and intact. No rash noted. Psychiatric: Mood and affect are normal. Speech and behavior are normal.  ____________________________________________   LABS (all labs ordered are listed, but only abnormal results are displayed)  Labs Reviewed - No data to display RADIOLOGY  Cervical spine x-ray per radiologist showed no acute bony abnormality. Patient had diffuse osteopenia and degenerative changes. I, Johnn Hai, personally viewed and evaluated these images (plain radiographs) as part of my medical decision making, as well as reviewing the written report by the radiologist. ____________________________________________   PROCEDURES  Procedure(s) performed: None  Procedures  Critical Care performed: No  ____________________________________________   INITIAL IMPRESSION / ASSESSMENT AND PLAN / ED COURSE  Pertinent labs & imaging results that were available during my care of the patient were reviewed by me and considered in  my medical decision making (see chart for details).    Clinical Course   Patient was given Percocet and 5 mg Valium while in the emergency room and states that her neck is feeling somewhat better but she can still feel that it "catches". Range of motion did improve with medication. Patient is continue taking Norco one every 4-6 hours as needed for pain and Valium 2 mg 1 3 times a day for 3 days. Patient is encouraged to use ice or heat to her neck and follow-up with her primary care doctor if any continued problems.  ____________________________________________   FINAL CLINICAL IMPRESSION(S) / ED DIAGNOSES  Final diagnoses:  Torticollis, acute      NEW MEDICATIONS STARTED DURING THIS VISIT:  Discharge Medication List as of 06/21/2016 10:13 AM    START taking these medications   Details  diazepam  (VALIUM) 2 MG tablet Take 1 tablet (2 mg total) by mouth every 8 (eight) hours as needed for muscle spasms., Starting Tue 06/21/2016, Print    HYDROcodone-acetaminophen (NORCO/VICODIN) 5-325 MG tablet Take one tablet every 4-6 hours if needed for pain., Print         Note:  This document was prepared using Dragon voice recognition software and may include unintentional dictation errors.    Johnn Hai, PA-C 06/21/16 1604    Rudene Re, MD 06/21/16 2216

## 2016-06-21 NOTE — Discharge Instructions (Signed)
Take medications only as directed. You may use ice or heat to your neck for comfort. Follow-up with your primary care doctor if any continued problems. The aware that these medications can cause drowsiness. This could increase your risk for falling.

## 2016-07-14 ENCOUNTER — Other Ambulatory Visit: Payer: Self-pay | Admitting: Nurse Practitioner

## 2016-07-14 ENCOUNTER — Other Ambulatory Visit: Payer: Self-pay | Admitting: *Deleted

## 2016-07-14 DIAGNOSIS — Z1231 Encounter for screening mammogram for malignant neoplasm of breast: Secondary | ICD-10-CM

## 2016-07-14 MED ORDER — METOPROLOL TARTRATE 25 MG PO TABS: 25.0000 mg | ORAL_TABLET | Freq: Two times a day (BID) | ORAL | 3 refills | Status: DC

## 2016-07-14 NOTE — Telephone Encounter (Signed)
Requested Prescriptions   Signed Prescriptions Disp Refills  . metoprolol tartrate (LOPRESSOR) 25 MG tablet 60 tablet 3    Sig: Take 1 tablet (25 mg total) by mouth 2 (two) times daily.    Authorizing Provider: Kathlyn Sacramento A    Ordering User: Britt Bottom

## 2016-07-28 ENCOUNTER — Other Ambulatory Visit: Payer: Self-pay | Admitting: Nurse Practitioner

## 2016-07-28 DIAGNOSIS — Z1231 Encounter for screening mammogram for malignant neoplasm of breast: Secondary | ICD-10-CM

## 2016-08-19 ENCOUNTER — Ambulatory Visit
Admission: RE | Admit: 2016-08-19 | Discharge: 2016-08-19 | Disposition: A | Discharge status: Home / Self Care | Payer: Medicaid Other | Source: Ambulatory Visit | Attending: Nurse Practitioner | Admitting: Nurse Practitioner

## 2016-08-19 DIAGNOSIS — Z1231 Encounter for screening mammogram for malignant neoplasm of breast: Secondary | ICD-10-CM

## 2016-08-23 ENCOUNTER — Inpatient Hospital Stay: Payer: Medicaid Other

## 2016-08-23 DIAGNOSIS — Z79899 Other long term (current) drug therapy: Secondary | ICD-10-CM | POA: Diagnosis not present

## 2016-08-23 DIAGNOSIS — I252 Old myocardial infarction: Secondary | ICD-10-CM | POA: Diagnosis not present

## 2016-08-23 DIAGNOSIS — E785 Hyperlipidemia, unspecified: Secondary | ICD-10-CM | POA: Insufficient documentation

## 2016-08-23 DIAGNOSIS — K219 Gastro-esophageal reflux disease without esophagitis: Secondary | ICD-10-CM | POA: Insufficient documentation

## 2016-08-23 DIAGNOSIS — I2510 Atherosclerotic heart disease of native coronary artery without angina pectoris: Secondary | ICD-10-CM | POA: Insufficient documentation

## 2016-08-23 DIAGNOSIS — I1310 Hypertensive heart and chronic kidney disease without heart failure, with stage 1 through stage 4 chronic kidney disease, or unspecified chronic kidney disease: Secondary | ICD-10-CM | POA: Diagnosis not present

## 2016-08-23 DIAGNOSIS — R7989 Other specified abnormal findings of blood chemistry: Secondary | ICD-10-CM | POA: Insufficient documentation

## 2016-08-23 DIAGNOSIS — F17210 Nicotine dependence, cigarettes, uncomplicated: Secondary | ICD-10-CM | POA: Diagnosis not present

## 2016-08-23 DIAGNOSIS — E1122 Type 2 diabetes mellitus with diabetic chronic kidney disease: Secondary | ICD-10-CM | POA: Insufficient documentation

## 2016-08-23 DIAGNOSIS — M797 Fibromyalgia: Secondary | ICD-10-CM | POA: Diagnosis not present

## 2016-08-23 DIAGNOSIS — D508 Other iron deficiency anemias: Secondary | ICD-10-CM | POA: Insufficient documentation

## 2016-08-23 DIAGNOSIS — M1990 Unspecified osteoarthritis, unspecified site: Secondary | ICD-10-CM | POA: Diagnosis not present

## 2016-08-23 DIAGNOSIS — F419 Anxiety disorder, unspecified: Secondary | ICD-10-CM | POA: Insufficient documentation

## 2016-08-23 DIAGNOSIS — Z7982 Long term (current) use of aspirin: Secondary | ICD-10-CM | POA: Insufficient documentation

## 2016-08-23 DIAGNOSIS — K449 Diaphragmatic hernia without obstruction or gangrene: Secondary | ICD-10-CM | POA: Insufficient documentation

## 2016-08-23 DIAGNOSIS — N184 Chronic kidney disease, stage 4 (severe): Secondary | ICD-10-CM | POA: Insufficient documentation

## 2016-08-23 DIAGNOSIS — Z794 Long term (current) use of insulin: Secondary | ICD-10-CM | POA: Diagnosis not present

## 2016-08-23 LAB — CBC WITH DIFFERENTIAL/PLATELET
BASOS PCT: 1 %
Basophils Absolute: 0.1 10*3/uL (ref 0–0.1)
EOS ABS: 0.1 10*3/uL (ref 0–0.7)
EOS PCT: 2 %
HCT: 40.5 % (ref 35.0–47.0)
HEMOGLOBIN: 13.7 g/dL (ref 12.0–16.0)
Lymphocytes Relative: 36 %
Lymphs Abs: 2.2 10*3/uL (ref 1.0–3.6)
MCH: 29.2 pg (ref 26.0–34.0)
MCHC: 33.7 g/dL (ref 32.0–36.0)
MCV: 86.8 fL (ref 80.0–100.0)
MONOS PCT: 9 %
Monocytes Absolute: 0.6 10*3/uL (ref 0.2–0.9)
NEUTROS PCT: 52 %
Neutro Abs: 3.2 10*3/uL (ref 1.4–6.5)
PLATELETS: 273 10*3/uL (ref 150–440)
RBC: 4.67 MIL/uL (ref 3.80–5.20)
RDW: 17.1 % — ABNORMAL HIGH (ref 11.5–14.5)
WBC: 6.2 10*3/uL (ref 3.6–11.0)

## 2016-08-23 LAB — BASIC METABOLIC PANEL
ANION GAP: 9 (ref 5–15)
BUN: 25 mg/dL — AB (ref 6–20)
CHLORIDE: 107 mmol/L (ref 101–111)
CO2: 23 mmol/L (ref 22–32)
Calcium: 9.8 mg/dL (ref 8.9–10.3)
Creatinine, Ser: 1.78 mg/dL — ABNORMAL HIGH (ref 0.44–1.00)
GFR calc Af Amer: 34 mL/min — ABNORMAL LOW (ref >60)
GFR, EST NON AFRICAN AMERICAN: 29 mL/min — AB (ref >60)
GLUCOSE: 102 mg/dL — AB (ref 65–99)
POTASSIUM: 4.1 mmol/L (ref 3.5–5.1)
Sodium: 139 mmol/L (ref 135–145)

## 2016-08-23 LAB — IRON AND TIBC
IRON: 90 ug/dL (ref 28–170)
Saturation Ratios: 28 % (ref 10.4–31.8)
TIBC: 326 ug/dL (ref 250–450)
UIBC: 236 ug/dL

## 2016-08-23 LAB — FERRITIN: FERRITIN: 72 ng/mL (ref 11–307)

## 2016-08-25 ENCOUNTER — Inpatient Hospital Stay: Payer: Medicaid Other | Attending: Internal Medicine | Admitting: Internal Medicine

## 2016-08-25 ENCOUNTER — Inpatient Hospital Stay: Payer: Medicaid Other

## 2016-08-25 ENCOUNTER — Encounter: Payer: Self-pay | Admitting: Internal Medicine

## 2016-08-25 VITALS — BP 138/82 | HR 73 | Resp 20

## 2016-08-25 VITALS — BP 134/83 | HR 72 | Temp 96.3°F | Resp 19 | Ht 63.0 in | Wt 170.8 lb

## 2016-08-25 DIAGNOSIS — I1310 Hypertensive heart and chronic kidney disease without heart failure, with stage 1 through stage 4 chronic kidney disease, or unspecified chronic kidney disease: Secondary | ICD-10-CM

## 2016-08-25 DIAGNOSIS — R7989 Other specified abnormal findings of blood chemistry: Secondary | ICD-10-CM

## 2016-08-25 DIAGNOSIS — D508 Other iron deficiency anemias: Secondary | ICD-10-CM | POA: Diagnosis not present

## 2016-08-25 DIAGNOSIS — N184 Chronic kidney disease, stage 4 (severe): Secondary | ICD-10-CM

## 2016-08-25 DIAGNOSIS — F17210 Nicotine dependence, cigarettes, uncomplicated: Secondary | ICD-10-CM

## 2016-08-25 DIAGNOSIS — Z79899 Other long term (current) drug therapy: Secondary | ICD-10-CM

## 2016-08-25 MED ORDER — FERUMOXYTOL INJECTION 510 MG/17 ML
510.0000 mg | Freq: Once | INTRAVENOUS | Status: AC
  Administered 2016-08-25: 510 mg via INTRAVENOUS
  Filled 2016-08-25: qty 17

## 2016-08-25 MED ORDER — SODIUM CHLORIDE 0.9 % IV SOLN
Freq: Once | INTRAVENOUS | Status: AC
  Administered 2016-08-25: 15:00:00 via INTRAVENOUS
  Filled 2016-08-25: qty 1000

## 2016-08-25 MED ORDER — DIPHENHYDRAMINE HCL 50 MG/ML IJ SOLN
25.0000 mg | Freq: Once | INTRAMUSCULAR | Status: AC
  Administered 2016-08-25: 25 mg via INTRAVENOUS
  Filled 2016-08-25: qty 1

## 2016-08-25 NOTE — Progress Notes (Signed)
[place] OFFICE PROGRESS NOTE  Patient Care Team: [facility] as PCP - General (General Practice)   SUMMARY OF ONCOLOGIC HISTORY:  # AUG 2016- ELEVATED K/L ratio 1.99; SPEP- Neg.   # March 2017- Anemia- Hb 8; plan Iv iron s/p IV iron x2 [[april 2017]; [place] May 2017.   # CKD [? nephrologist as per pt; ? Dr.Kruska]/ CAD  INTERVAL HISTORY:  65 year old pleasant female patient with above history of elevated Lambda light chain ratio/ IDA sec to CKD is here for follow up.  Complains of fatigue. Significant improvement in the fatigue levels post IV infusion the past. Colonoscopy interim is negative as per patient.  Patient denies any blood in stools. Patient is on by mouth iron. Intermittent constipation.    REVIEW OF SYSTEMS:  A complete 10 point review of system is done which is negative except mentioned above/history of present illness.   PAST MEDICAL HISTORY :  Past Medical History:  Diagnosis Date  . Anemia   . Anxiety   . Arthritis   . CKD (chronic kidney disease), stage IV ([place])   . Coronary artery disease    a. 03/2014 NSTEMI/Cath: LM nl, LAD 54m, 60d, D1 60, D2 nl, d3 40, LCX 100 w/ L->L collats, RCA non dom, min irregs, EF 55%-->Med Rx.  . DDD (degenerative disc disease)    a. s/p upper back surgery spring 2015.  . Diabetes mellitus without complication ([place])   . Diverticulosis   . Fibromyalgia   . GERD (gastroesophageal reflux disease)   . Hiatal hernia   . Hyperlipidemia   . Hypertensive heart disease    a. 03/2015 Echo: 55-60%, mild conc LVH.  Marland Kitchen Syncope and collapse    a. Early 03/2014->did not seek medical attn.  . Tobacco abuse     PAST SURGICAL HISTORY :   Past Surgical History:  Procedure Laterality Date  . BREAST CYST ASPIRATION Right 2005   NEG  . CARDIAC CATHETERIZATION  06/2005   [facility]  . CARDIAC CATHETERIZATION  04/2014   [facility]  . COLONOSCOPY WITH PROPOFOL N/A 04/27/2016   Procedure: COLONOSCOPY WITH  PROPOFOL;  Surgeon: Manya Silvas, MD;  Location: [facility] ENDOSCOPY;  Service: Endoscopy;  Laterality: N/A;  . CORONARY ANGIOPLASTY    . DILATATION & CURETTAGE/HYSTEROSCOPY WITH MYOSURE N/A 09/18/2015   Procedure: DILATATION & CURETTAGE/HYSTEROSCOPY WITH MYOSURE;  Surgeon: Boykin Nearing, MD;  Location: [facility] ORS;  Service: Gynecology;  Laterality: N/A;  . ESOPHAGOGASTRODUODENOSCOPY (EGD) WITH PROPOFOL N/A 04/27/2016   Procedure: ESOPHAGOGASTRODUODENOSCOPY (EGD) WITH PROPOFOL;  Surgeon: Manya Silvas, MD;  Location: [facility] ENDOSCOPY;  Service: Endoscopy;  Laterality: N/A;  . NECK SURGERY     Degenerative Disk Disease and removal of a spinal cyst  . TUBAL LIGATION      FAMILY HISTORY :   Family History  Problem Relation Age of Onset  . Heart disease Mother   . Heart attack Mother   . Hypertension Mother   . Hyperlipidemia Mother   . Breast cancer Sister     SOCIAL HISTORY:   Social History  Substance Use Topics  . Smoking status: Current Every Day Smoker    Packs/day: 0.25    Years: 40.00    Types: Cigarettes  . Smokeless tobacco: Never Used  . Alcohol use No    ALLERGIES:  is allergic to aspirin and hydrochlorothiazide.  MEDICATIONS:  Current Outpatient Prescriptions  Medication Sig Dispense Refill  . allopurinol (ZYLOPRIM) 100 MG tablet Take 100  mg by mouth 2 (two) times daily.    Marland Kitchen amLODipine (NORVASC) 5 MG tablet Take 1 tablet (5 mg total) by mouth daily. 30 tablet 3  . aspirin 81 MG chewable tablet Chew 81 mg by mouth daily.    Marland Kitchen atorvastatin (LIPITOR) 40 MG tablet Take 40 mg by mouth daily at 6 PM.     . cetirizine (ZYRTEC) 10 MG tablet Take 10 mg by mouth daily.    . colchicine 0.6 MG tablet Take 1 tablet twice daily for 3 days then 1 tablet daily.    . cromolyn (OPTICROM) 4 % ophthalmic solution 1 drop 4 (four) times daily.    . cyclobenzaprine (FLEXERIL) 5 MG tablet Take 5 mg by mouth as needed for muscle spasms. Reported on 05/17/2016    . fluticasone  (FLONASE) 50 MCG/ACT nasal spray 1 spray by Each Nare route daily.    . insulin glargine (LANTUS) 100 UNIT/ML injection Inject 35 Units into the skin every morning. 44 units at bedtime    . Insulin Lispro, Human, (HUMALOG KWIKPEN [place]) Inject 8 Units into the skin every morning. 28 units at lunch and supper    . lisinopril (PRINIVIL,ZESTRIL) 5 MG tablet Take 5 mg by mouth at bedtime.    . magnesium oxide (MAG-OX) 400 MG tablet Take 400 mg by mouth 2 (two) times daily.    . metoprolol tartrate (LOPRESSOR) 25 MG tablet Take 1 tablet (25 mg total) by mouth 2 (two) times daily. 60 tablet 3  . Omega-3 Fatty Acids (FISH OIL MAXIMUM STRENGTH) 1200 MG CAPS Take 1,000 mg by mouth 2 (two) times daily.   0  . Omega-3 Fatty Acids (FISH OIL PO)     . triamcinolone cream (KENALOG) 0.1 % Apply 1 application topically 2 (two) times daily.    . pantoprazole (PROTONIX) 20 MG tablet   0   No current facility-administered medications for this visit.     PHYSICAL EXAMINATION:   BP 134/83 (BP Location: Left Arm, Patient Position: Sitting)   Pulse 72   Temp (!) 96.3 F (35.7 C) (Tympanic)   Resp 19   Ht 5\' 3"  (1.6 m)   Wt 170 lb 12.8 oz (77.5 kg)   BMI 30.26 kg/m   Filed Weights   08/25/16 1333  Weight: 170 lb 12.8 oz (77.5 kg)    GENERAL: Well-nourished well-developed; Alert, no distress and comfortable.  Alone.  EYES: no pallor or icterus OROPHARYNX: no thrush or ulceration; good dentition  NECK: supple, no masses felt LYMPH:  no palpable lymphadenopathy in the cervical, axillary or inguinal regions LUNGS: clear to auscultation and  No wheeze or crackles HEART/CVS: regular rate & rhythm and no murmurs; No lower extremity edema ABDOMEN:abdomen soft, non-tender and normal bowel sounds Musculoskeletal:no cyanosis of digits and no clubbing  PSYCH: alert & oriented x 3 with fluent speech NEURO: no focal motor/sensory deficits SKIN:  no rashes or significant lesions  LABORATORY DATA:  I have  reviewed the data as listed    Component Value Date/Time   NA 139 08/23/2016 1354   NA 139 10/21/2014 1332   K 4.1 08/23/2016 1354   K 4.4 10/21/2014 1332   CL 107 08/23/2016 1354   CL 108 (H) 10/21/2014 1332   CO2 23 08/23/2016 1354   CO2 24 10/21/2014 1332   GLUCOSE 102 (H) 08/23/2016 1354   GLUCOSE 103 (H) 10/21/2014 1332   BUN 25 (H) 08/23/2016 1354   BUN 21 (H) 10/21/2014 1332   CREATININE 1.78 (  H) 08/23/2016 1354   CREATININE 1.45 (H) 10/21/2014 1332   CALCIUM 9.8 08/23/2016 1354   CALCIUM 9.1 10/21/2014 1332   PROT 7.2 02/26/2016 1452   PROT 7.5 10/21/2014 1332   ALBUMIN 3.7 02/26/2016 1452   ALBUMIN 3.6 10/21/2014 1332   AST 24 02/26/2016 1452   AST 15 10/21/2014 1332   ALT 22 02/26/2016 1452   ALT 27 10/21/2014 1332   ALKPHOS 77 02/26/2016 1452   ALKPHOS 94 10/21/2014 1332   BILITOT 0.3 02/26/2016 1452   BILITOT 0.2 10/21/2014 1332   GFRNONAA 29 (L) 08/23/2016 1354   GFRNONAA 39 (L) 10/21/2014 1332   GFRNONAA 27 (L) 05/10/2014 0031   GFRAA 34 (L) 08/23/2016 1354   GFRAA 47 (L) 10/21/2014 1332   GFRAA 32 (L) 05/10/2014 0031    No results found for: SPEP, UPEP  Lab Results  Component Value Date   WBC 6.2 08/23/2016   NEUTROABS 3.2 08/23/2016   HGB 13.7 08/23/2016   HCT 40.5 08/23/2016   MCV 86.8 08/23/2016   PLT 273 08/23/2016      Chemistry      Component Value Date/Time   NA 139 08/23/2016 1354   NA 139 10/21/2014 1332   K 4.1 08/23/2016 1354   K 4.4 10/21/2014 1332   CL 107 08/23/2016 1354   CL 108 (H) 10/21/2014 1332   CO2 23 08/23/2016 1354   CO2 24 10/21/2014 1332   BUN 25 (H) 08/23/2016 1354   BUN 21 (H) 10/21/2014 1332   CREATININE 1.78 (H) 08/23/2016 1354   CREATININE 1.45 (H) 10/21/2014 1332      Component Value Date/Time   CALCIUM 9.8 08/23/2016 1354   CALCIUM 9.1 10/21/2014 1332   ALKPHOS 77 02/26/2016 1452   ALKPHOS 94 10/21/2014 1332   AST 24 02/26/2016 1452   AST 15 10/21/2014 1332   ALT 22 02/26/2016 1452   ALT 27  10/21/2014 1332   BILITOT 0.3 02/26/2016 1452   BILITOT 0.2 10/21/2014 1332       ASSESSMENT & PLAN:   Other iron deficiency anemias #  Anemia Iron deficiency- hemoglobin significant improved to 12.2 post IV iron in April 2017. Hemoglobin 13/ ferritin 70 saturation 20%- the context of CKD/fatigue- recommend IV Feraheme infusion again today.   #  Lambda light chain ratio slightly elevated-  Likely from a chronic renal insufficiency; unlikely  MGUS.  Ratio 2.27 February 2016. We'll repeat at next visit..   # CKD- creatinine-1.72/stable   # Follow-up in 6 months/labs and studies 2 days before; possible Feraheme.       Cammie Sickle, MD 08/25/2016 2:02 PM

## 2016-08-25 NOTE — Assessment & Plan Note (Addendum)
#    Anemia Iron deficiency- hemoglobin significant improved to 12.2 post IV iron in April 2017. Hemoglobin 13/ ferritin 70 saturation 20%- the context of CKD/fatigue- recommend IV Feraheme infusion again today.   #  Lambda light chain ratio slightly elevated-  Likely from a chronic renal insufficiency; unlikely  MGUS.  Ratio 2.27 February 2016. We'll repeat at next visit..   # CKD- creatinine-1.72/stable   # Follow-up in 6 months/labs and studies 2 days before; possible Feraheme.

## 2016-08-25 NOTE — Progress Notes (Signed)
Pt complains today of feeling stiff.  Pt states overall she is feeling better.  Has had diarrhea now for 7 daysd

## 2016-12-20 ENCOUNTER — Ambulatory Visit: Payer: Medicare Other | Attending: Internal Medicine

## 2016-12-20 DIAGNOSIS — G5603 Carpal tunnel syndrome, bilateral upper limbs: Secondary | ICD-10-CM | POA: Insufficient documentation

## 2016-12-20 DIAGNOSIS — G4733 Obstructive sleep apnea (adult) (pediatric): Secondary | ICD-10-CM | POA: Insufficient documentation

## 2016-12-20 DIAGNOSIS — R0683 Snoring: Secondary | ICD-10-CM | POA: Diagnosis present

## 2016-12-20 DIAGNOSIS — M2550 Pain in unspecified joint: Secondary | ICD-10-CM | POA: Diagnosis not present

## 2017-01-10 ENCOUNTER — Encounter: Payer: Self-pay | Admitting: Cardiovascular Disease

## 2017-01-10 ENCOUNTER — Ambulatory Visit (INDEPENDENT_AMBULATORY_CARE_PROVIDER_SITE_OTHER): Payer: Medicare Other | Admitting: Cardiovascular Disease

## 2017-01-10 VITALS — BP 138/64 | HR 82 | Ht 63.0 in | Wt 170.2 lb

## 2017-01-10 DIAGNOSIS — I2510 Atherosclerotic heart disease of native coronary artery without angina pectoris: Secondary | ICD-10-CM | POA: Diagnosis not present

## 2017-01-10 DIAGNOSIS — R011 Cardiac murmur, unspecified: Secondary | ICD-10-CM

## 2017-01-10 DIAGNOSIS — I10 Essential (primary) hypertension: Secondary | ICD-10-CM | POA: Diagnosis not present

## 2017-01-10 DIAGNOSIS — Z720 Tobacco use: Secondary | ICD-10-CM

## 2017-01-10 NOTE — Patient Instructions (Signed)
Medication Instructions:  Your physician recommends that you continue on your current medications as directed. Please refer to the Current Medication list given to you today.   Labwork: none  Testing/Procedures: Your physician has requested that you have an echocardiogram. Echocardiography is a painless test that uses sound waves to create images of your heart. It provides your doctor with information about the size and shape of your heart and how well your heart's chambers and valves are working. This procedure takes approximately one hour. There are no restrictions for this procedure.    Follow-Up: Your physician wants you to follow-up in: one year with Dr. Fletcher Anon.  You will receive a reminder letter in the mail two months in advance. If you don't receive a letter, please call our office to schedule the follow-up appointment.   Any Other Special Instructions Will Be Listed Below (If Applicable).     If you need a refill on your cardiac medications before your next appointment, please call your pharmacy.

## 2017-01-10 NOTE — Progress Notes (Signed)
Cardiology Office Note   Date:  01/10/2017   ID:  [name], DOB 06-10-1951, MRN [id number]  PCP:  [place]  Cardiologist:   Kathlyn Sacramento, MD   Chief Complaint  Patient presents with  . other    6 month follow up. Meds reviewed by the pt. verbally. "doing well."      History of Present Illness: Caitlin Bass is a 66 y.o. female who presents for a followup visit regarding coronary artery disease.  She had non-ST elevation myocardial infarction in May 2015. Echo showed nl LV fxn. Cardiac catheterization showed an occluded LCX with L-L collats and otherwise nonobstructive CAD. She was medically managed . 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 She was treated with IV iron but was found to have heme positive stools. She was referred to gastroenterology. She underwent EGD and colonoscopy which showed a small polyp and internal hemorrhoids.  Clopidogrel was discontinued last visit. She continues to take aspirin 81 mg once daily. She has been doing reasonably well and denies any chest pain or shortness of breath. Unfortunately, he continues to smoke.   Past Medical History:  Diagnosis Date  . Anemia   . Anxiety   . Arthritis   . CKD (chronic kidney disease), stage IV ([place])   . Coronary artery disease    a. 03/2014 NSTEMI/Cath: LM nl, LAD 45m, 60d, D1 60, D2 nl, d3 40, LCX 100 w/ L->L collats, RCA non dom, min irregs, EF 55%-->Med Rx.  . DDD (degenerative disc disease)    a. s/p upper back surgery spring 2015.  . Diabetes mellitus without complication ([place])   . Diverticulosis   . Fibromyalgia   . GERD (gastroesophageal reflux disease)   . Hiatal hernia   . Hyperlipidemia   . Hypertensive heart disease    a. 03/2015 Echo: 55-60%, mild conc LVH.  Marland Kitchen Syncope and collapse    a. Early 03/2014->did not seek medical attn.  . Tobacco abuse     Past Surgical History:  Procedure Laterality Date  . BREAST CYST  ASPIRATION Right 2005   NEG  . CARDIAC CATHETERIZATION  06/2005   [facility]  . CARDIAC CATHETERIZATION  04/2014   [facility]  . COLONOSCOPY WITH PROPOFOL N/A 04/27/2016   Procedure: COLONOSCOPY WITH PROPOFOL;  Surgeon: Manya Silvas, MD;  Location: [facility] ENDOSCOPY;  Service: Endoscopy;  Laterality: N/A;  . CORONARY ANGIOPLASTY    . DILATATION & CURETTAGE/HYSTEROSCOPY WITH MYOSURE N/A 09/18/2015   Procedure: DILATATION & CURETTAGE/HYSTEROSCOPY WITH MYOSURE;  Surgeon: Boykin Nearing, MD;  Location: [facility] ORS;  Service: Gynecology;  Laterality: N/A;  . ESOPHAGOGASTRODUODENOSCOPY (EGD) WITH PROPOFOL N/A 04/27/2016   Procedure: ESOPHAGOGASTRODUODENOSCOPY (EGD) WITH PROPOFOL;  Surgeon: Manya Silvas, MD;  Location: [facility] ENDOSCOPY;  Service: Endoscopy;  Laterality: N/A;  . NECK SURGERY     Degenerative Disk Disease and removal of a spinal cyst  . TUBAL LIGATION       Current Outpatient Prescriptions  Medication Sig Dispense Refill  . allopurinol (ZYLOPRIM) 100 MG tablet Take 100 mg by mouth 2 (two) times daily.    Marland Kitchen amLODipine (NORVASC) 5 MG tablet Take 1 tablet (5 mg total) by mouth daily. 30 tablet 3  . aspirin 81 MG chewable tablet Chew 81 mg by mouth daily.    Marland Kitchen atorvastatin (LIPITOR) 40 MG tablet Take 40 mg by mouth daily at 6 PM.     . cetirizine (ZYRTEC) 10 MG  tablet Take 10 mg by mouth daily.    . colchicine 0.6 MG tablet Take 1 tablet twice daily for 3 days then 1 tablet daily.    . cromolyn (OPTICROM) 4 % ophthalmic solution 1 drop 4 (four) times daily.    . cyclobenzaprine (FLEXERIL) 5 MG tablet Take 5 mg by mouth as needed for muscle spasms. Reported on 05/17/2016    . fluticasone (FLONASE) 50 MCG/ACT nasal spray 1 spray by Each Nare route daily.    . insulin glargine (LANTUS) 100 UNIT/ML injection Inject 35 Units into the skin every morning. 44 units at bedtime    . Insulin Lispro, Human, (HUMALOG KWIKPEN [place]) Inject 8 Units into the skin every morning. 28 units at lunch and supper      . lisinopril (PRINIVIL,ZESTRIL) 5 MG tablet Take 5 mg by mouth at bedtime.    . magnesium oxide (MAG-OX) 400 MG tablet Take 400 mg by mouth 2 (two) times daily.    . metoprolol tartrate (LOPRESSOR) 25 MG tablet Take 1 tablet (25 mg total) by mouth 2 (two) times daily. 60 tablet 3  . Omega-3 Fatty Acids (FISH OIL MAXIMUM STRENGTH) 1200 MG CAPS Take 1,000 mg by mouth 2 (two) times daily.   0  . Omega-3 Fatty Acids (FISH OIL PO)     . pantoprazole (PROTONIX) 20 MG tablet   0  . triamcinolone cream (KENALOG) 0.1 % Apply 1 application topically 2 (two) times daily.     No current facility-administered medications for this visit.     Allergies:   Aspirin and Hydrochlorothiazide    Social History:  The patient  reports that she has been smoking Cigarettes.  She has a 10.00 pack-year smoking history. She has never used smokeless tobacco. She reports that she does not drink alcohol or use drugs.   Family History:  The patient's family history includes Breast cancer in her sister; Heart attack in her mother; Heart disease in her mother; Hyperlipidemia in her mother; Hypertension in her mother.    ROS:  Please see the history of present illness.   Otherwise, review of systems are positive for none.   All other systems are reviewed and negative.    PHYSICAL EXAM: VS:  BP 138/64 (BP Location: Left Arm, Patient Position: Sitting, Cuff Size: Normal)   Pulse 82   Ht 5\' 3"  (1.6 m)   Wt 170 lb 4 oz (77.2 kg)   BMI 30.16 kg/m  , BMI Body mass index is 30.16 kg/m. GEN: Well nourished, well developed, in no acute distress  HEENT: normal  Neck: no JVD, carotid bruits, or masses Cardiac: RRR; no  rubs, or gallops,no edema . 2/6 SEM at the aortic area.  Respiratory:  clear to auscultation bilaterally, normal work of breathing GI: soft, nontender, nondistended, + BS MS: no deformity or atrophy  Skin: warm and dry, no rash Neuro:  Strength and sensation are intact Psych: euthymic mood, full  affect   EKG:  EKG is ordered today. The ekg ordered today demonstrates  Normal sinus rhythm with nonspecific ST disc.  Recent Labs: 02/26/2016: ALT 22 08/23/2016: BUN 25; Creatinine, Ser 1.78; Hemoglobin 13.7; Platelets 273; Potassium 4.1; Sodium 139    Lipid Panel    Component Value Date/Time   CHOL 291 (H) 07/23/2013 0429   TRIG 550 (H) 07/23/2013 0429   HDL 37 (L) 07/23/2013 0429   VLDL SEE COMMENT 07/23/2013 0429   LDLCALC SEE COMMENT 07/23/2013 0429      Wt Readings  from Last 3 Encounters:  01/10/17 170 lb 4 oz (77.2 kg)  08/25/16 170 lb 12.8 oz (77.5 kg)  06/21/16 165 lb (74.8 kg)        ASSESSMENT AND PLAN:  1.  Coronary artery disease involving native coronary arteries without angina:  She is doing well from a cardiac standpoint. Continue medical therapy.  2. Essential hypertension: Blood pressure is  reasonably controlled now on amlodipine and metoprolol.  3. Hyperlipidemia: Currently on atorvastatin 40 mg once daily. This is being followed by her primary care physician. She reports that she was started recently on fish oil.  4. Chronic kidney disease: Followed by nephrology.  5. Tobacco use: still anxious. Still 7 cig /day.  I discussed with her the importance of smoking cessation.  6. Cardiac murmur with no recent evaluation: I requested an echocardiogram.  Disposition:   FU with me in 12 months  Signed,  Kathlyn Sacramento, MD  01/10/2017 2:38 PM    [place]

## 2017-01-16 ENCOUNTER — Ambulatory Visit
Admission: RE | Admit: 2017-01-16 | Discharge: 2017-01-16 | Disposition: A | Discharge status: Home / Self Care | Payer: Medicare Other | Source: Ambulatory Visit | Attending: Nurse Practitioner | Admitting: Nurse Practitioner

## 2017-01-16 ENCOUNTER — Other Ambulatory Visit: Payer: Self-pay | Admitting: Nurse Practitioner

## 2017-01-16 DIAGNOSIS — M79672 Pain in left foot: Secondary | ICD-10-CM

## 2017-01-16 DIAGNOSIS — M799 Soft tissue disorder, unspecified: Secondary | ICD-10-CM | POA: Diagnosis not present

## 2017-01-16 DIAGNOSIS — Z8781 Personal history of (healed) traumatic fracture: Secondary | ICD-10-CM | POA: Insufficient documentation

## 2017-02-06 ENCOUNTER — Ambulatory Visit (INDEPENDENT_AMBULATORY_CARE_PROVIDER_SITE_OTHER): Payer: Medicare Other

## 2017-02-06 ENCOUNTER — Other Ambulatory Visit: Payer: Self-pay

## 2017-02-06 DIAGNOSIS — R011 Cardiac murmur, unspecified: Secondary | ICD-10-CM | POA: Diagnosis not present

## 2017-02-06 LAB — ECHOCARDIOGRAM COMPLETE
Area-P 1/2: 1.8 cm2
CHL CUP DOP CALC LVOT VTI: 26.4 cm
CHL CUP LVOT MV VTI: 1.52
CHL CUP MV DEC (S): 254
CHL CUP MV M VEL: 99.7
CHL CUP STROKE VOLUME: 31 mL
E decel time: 254 msec
E/e' ratio: 24.28
FS: 51 % — AB (ref 28–44)
IVS/LV PW RATIO, ED: 1.23
LA ID, A-P, ES: 35 mm
LA diam end sys: 35 mm
LA vol: 64.2 mL
LADIAMINDEX: 1.93 cm/m2
LAVOLA4C: 46.9 mL
LAVOLIN: 35.5 mL/m2
LV PW d: 13 mm — AB (ref 0.6–1.1)
LV dias vol: 45 mL — AB (ref 46–106)
LV e' LATERAL: 4.53 cm/s
LV sys vol index: 8 mL/m2
LV sys vol: 14 mL (ref 14–42)
LVDIAVOLIN: 25 mL/m2
LVEEAVG: 24.28
LVEEMED: 24.28
LVOT MV VTI INDEX: 0.84 cm2/m2
LVOT area: 2.84 cm2
LVOT diameter: 19 mm
LVOTSV: 75 mL
MV Annulus VTI: 49.2 cm
MV pk A vel: 162 m/s
MVPG: 5 mmHg
MVPKEVEL: 110 m/s
Mean grad: 5 mmHg
P 1/2 time: 122 ms
RV LATERAL S' VELOCITY: 12.2 cm/s
RV TAPSE: 19.9 mm
Reg peak vel: 208 cm/s
Simpson's disk: 69
TDI e' lateral: 4.53
TDI e' medial: 2.89
TRMAXVEL: 208 cm/s

## 2017-02-08 ENCOUNTER — Telehealth: Payer: Self-pay | Admitting: Cardiovascular Disease

## 2017-02-08 NOTE — Telephone Encounter (Signed)
Received records request from [facility], forwarded to [facility] for processing.

## 2017-02-15 ENCOUNTER — Inpatient Hospital Stay: Payer: Medicare Other | Attending: Internal Medicine

## 2017-02-15 DIAGNOSIS — K449 Diaphragmatic hernia without obstruction or gangrene: Secondary | ICD-10-CM | POA: Insufficient documentation

## 2017-02-15 DIAGNOSIS — I2510 Atherosclerotic heart disease of native coronary artery without angina pectoris: Secondary | ICD-10-CM | POA: Insufficient documentation

## 2017-02-15 DIAGNOSIS — N184 Chronic kidney disease, stage 4 (severe): Secondary | ICD-10-CM | POA: Diagnosis not present

## 2017-02-15 DIAGNOSIS — I1310 Hypertensive heart and chronic kidney disease without heart failure, with stage 1 through stage 4 chronic kidney disease, or unspecified chronic kidney disease: Secondary | ICD-10-CM | POA: Insufficient documentation

## 2017-02-15 DIAGNOSIS — Z7982 Long term (current) use of aspirin: Secondary | ICD-10-CM | POA: Insufficient documentation

## 2017-02-15 DIAGNOSIS — M797 Fibromyalgia: Secondary | ICD-10-CM | POA: Insufficient documentation

## 2017-02-15 DIAGNOSIS — K219 Gastro-esophageal reflux disease without esophagitis: Secondary | ICD-10-CM | POA: Diagnosis not present

## 2017-02-15 DIAGNOSIS — D508 Other iron deficiency anemias: Secondary | ICD-10-CM

## 2017-02-15 DIAGNOSIS — F17210 Nicotine dependence, cigarettes, uncomplicated: Secondary | ICD-10-CM | POA: Insufficient documentation

## 2017-02-15 DIAGNOSIS — I252 Old myocardial infarction: Secondary | ICD-10-CM | POA: Diagnosis not present

## 2017-02-15 DIAGNOSIS — Z794 Long term (current) use of insulin: Secondary | ICD-10-CM | POA: Diagnosis not present

## 2017-02-15 DIAGNOSIS — F419 Anxiety disorder, unspecified: Secondary | ICD-10-CM | POA: Diagnosis not present

## 2017-02-15 DIAGNOSIS — D631 Anemia in chronic kidney disease: Secondary | ICD-10-CM | POA: Insufficient documentation

## 2017-02-15 DIAGNOSIS — E785 Hyperlipidemia, unspecified: Secondary | ICD-10-CM | POA: Insufficient documentation

## 2017-02-15 DIAGNOSIS — Z79899 Other long term (current) drug therapy: Secondary | ICD-10-CM | POA: Insufficient documentation

## 2017-02-15 DIAGNOSIS — K5900 Constipation, unspecified: Secondary | ICD-10-CM | POA: Insufficient documentation

## 2017-02-15 DIAGNOSIS — E1122 Type 2 diabetes mellitus with diabetic chronic kidney disease: Secondary | ICD-10-CM | POA: Diagnosis not present

## 2017-02-15 LAB — BASIC METABOLIC PANEL
Anion gap: 8 (ref 5–15)
BUN: 31 mg/dL — ABNORMAL HIGH (ref 6–20)
CHLORIDE: 107 mmol/L (ref 101–111)
CO2: 23 mmol/L (ref 22–32)
CREATININE: 2.04 mg/dL — AB (ref 0.44–1.00)
Calcium: 9.7 mg/dL (ref 8.9–10.3)
GFR calc non Af Amer: 24 mL/min — ABNORMAL LOW (ref >60)
GFR, EST AFRICAN AMERICAN: 28 mL/min — AB (ref >60)
Glucose, Bld: 112 mg/dL — ABNORMAL HIGH (ref 65–99)
POTASSIUM: 5.5 mmol/L — AB (ref 3.5–5.1)
SODIUM: 138 mmol/L (ref 135–145)

## 2017-02-15 LAB — FERRITIN: FERRITIN: 329 ng/mL — AB (ref 11–307)

## 2017-02-15 LAB — CBC WITH DIFFERENTIAL/PLATELET
BASOS PCT: 2 %
Basophils Absolute: 0.1 10*3/uL (ref 0–0.1)
Eosinophils Absolute: 0.1 10*3/uL (ref 0–0.7)
Eosinophils Relative: 2 %
HEMATOCRIT: 33.6 % — AB (ref 35.0–47.0)
HEMOGLOBIN: 11.2 g/dL — AB (ref 12.0–16.0)
LYMPHS ABS: 1.8 10*3/uL (ref 1.0–3.6)
Lymphocytes Relative: 21 %
MCH: 29.3 pg (ref 26.0–34.0)
MCHC: 33.5 g/dL (ref 32.0–36.0)
MCV: 87.5 fL (ref 80.0–100.0)
MONOS PCT: 10 %
Monocytes Absolute: 0.9 10*3/uL (ref 0.2–0.9)
NEUTROS ABS: 5.6 10*3/uL (ref 1.4–6.5)
NEUTROS PCT: 65 %
Platelets: 527 10*3/uL — ABNORMAL HIGH (ref 150–440)
RBC: 3.84 MIL/uL (ref 3.80–5.20)
RDW: 15.1 % — ABNORMAL HIGH (ref 11.5–14.5)
WBC: 8.4 10*3/uL (ref 3.6–11.0)

## 2017-02-15 LAB — IRON AND TIBC
IRON: 38 ug/dL (ref 28–170)
SATURATION RATIOS: 19 % (ref 10.4–31.8)
TIBC: 204 ug/dL — ABNORMAL LOW (ref 250–450)
UIBC: 166 ug/dL

## 2017-02-22 ENCOUNTER — Inpatient Hospital Stay: Payer: Medicare Other

## 2017-02-22 ENCOUNTER — Inpatient Hospital Stay (HOSPITAL_BASED_OUTPATIENT_CLINIC_OR_DEPARTMENT_OTHER): Payer: Medicare Other | Admitting: Internal Medicine

## 2017-02-22 VITALS — BP 113/69 | HR 73 | Temp 97.0°F | Resp 18

## 2017-02-22 DIAGNOSIS — Z79899 Other long term (current) drug therapy: Secondary | ICD-10-CM | POA: Diagnosis not present

## 2017-02-22 DIAGNOSIS — F17210 Nicotine dependence, cigarettes, uncomplicated: Secondary | ICD-10-CM

## 2017-02-22 DIAGNOSIS — D631 Anemia in chronic kidney disease: Secondary | ICD-10-CM | POA: Insufficient documentation

## 2017-02-22 DIAGNOSIS — I130 Hypertensive heart and chronic kidney disease with heart failure and stage 1 through stage 4 chronic kidney disease, or unspecified chronic kidney disease: Secondary | ICD-10-CM | POA: Diagnosis not present

## 2017-02-22 DIAGNOSIS — N184 Chronic kidney disease, stage 4 (severe): Secondary | ICD-10-CM | POA: Diagnosis not present

## 2017-02-22 DIAGNOSIS — I1310 Hypertensive heart and chronic kidney disease without heart failure, with stage 1 through stage 4 chronic kidney disease, or unspecified chronic kidney disease: Secondary | ICD-10-CM | POA: Diagnosis not present

## 2017-02-22 DIAGNOSIS — D508 Other iron deficiency anemias: Secondary | ICD-10-CM

## 2017-02-22 MED ORDER — DIPHENHYDRAMINE HCL 50 MG/ML IJ SOLN
25.0000 mg | Freq: Once | INTRAMUSCULAR | Status: AC
  Administered 2017-02-22: 25 mg via INTRAVENOUS
  Filled 2017-02-22: qty 1

## 2017-02-22 MED ORDER — SODIUM CHLORIDE 0.9 % IV SOLN
Freq: Once | INTRAVENOUS | Status: AC
Start: 2017-02-22 — End: 2017-02-22
  Administered 2017-02-22: 15:00:00 via INTRAVENOUS
  Filled 2017-02-22: qty 1000

## 2017-02-22 MED ORDER — SODIUM CHLORIDE 0.9 % IV SOLN
510.0000 mg | Freq: Once | INTRAVENOUS | Status: AC
  Administered 2017-02-22: 510 mg via INTRAVENOUS
  Filled 2017-02-22: qty 17

## 2017-02-22 NOTE — Assessment & Plan Note (Addendum)
#    Anemia Iron deficiency- hemoglobin today 11; saturation 19%; ferritin 329.  the context of CKD/fatigue- recommend IV Feraheme infusion again today. Continue PO iron once a day. If continues to be anemic; that would recommend addition of Aranesp.   #  Lambda light chain ratio slightly elevated-  Likely from a chronic renal insufficiency; unlikely  MGUS.  Ratio 2.27 February 2016.   # CKD- creatinine-2/ potassium- 5.5. Follow up with nephrology.   # Follow-up in 6 months/labs and studies 2 days before; possible Feraheme.   [id number], Doyne Keel; nephrology.

## 2017-02-22 NOTE — Progress Notes (Signed)
Patient here today for follow up.   

## 2017-02-22 NOTE — Progress Notes (Signed)
[facility] OFFICE PROGRESS NOTE  Patient Care Team: Elisabeth Cara, NP as PCP - General (Nurse Practitioner)   SUMMARY OF ONCOLOGIC HISTORY:  # AUG 2016- ELEVATED K/L ratio 1.99; SPEP- Neg.   # March 2017- Anemia- Hb 8; plan Iv iron s/p IV iron x2 [[april 2017]; [place] May 2017.   # CKD [? nephrologist as per pt; ? Dr.Swain]/ CAD  INTERVAL HISTORY:  66 year old pleasant female patient with above history of elevated Lambda light chain ratio/ IDA sec to CKD is here for follow up.  Patient denies any blood in stools. Patient is on by mouth iron. Intermittent constipation. Denies any blood loss. Not on dialysis. Complains of fatigue. Denies any swelling in the legs.  REVIEW OF SYSTEMS:  A complete 10 point review of system is done which is negative except mentioned above/history of present illness.   PAST MEDICAL HISTORY :  Past Medical History:  Diagnosis Date  . Anemia   . Anxiety   . Arthritis   . CKD (chronic kidney disease), stage IV ([place])   . Coronary artery disease    a. 03/2014 NSTEMI/Cath: LM nl, LAD 43m, 60d, D1 60, D2 nl, d3 40, LCX 100 w/ L->L collats, RCA non dom, min irregs, EF 55%-->Med Rx.  . DDD (degenerative disc disease)    a. s/p upper back surgery spring 2015.  . Diabetes mellitus without complication ([place])   . Diverticulosis   . Fibromyalgia   . GERD (gastroesophageal reflux disease)   . Hiatal hernia   . Hyperlipidemia   . Hypertensive heart disease    a. 03/2015 Echo: 55-60%, mild conc LVH.  Marland Kitchen Syncope and collapse    a. Early 03/2014->did not seek medical attn.  . Tobacco abuse     PAST SURGICAL HISTORY :   Past Surgical History:  Procedure Laterality Date  . BREAST CYST ASPIRATION Right 2005   NEG  . CARDIAC CATHETERIZATION  06/2005   [facility]  . CARDIAC CATHETERIZATION  04/2014   [facility]  . COLONOSCOPY WITH PROPOFOL N/A 04/27/2016   Procedure: COLONOSCOPY WITH PROPOFOL;  Surgeon: Manya Silvas, MD;  Location: [facility] ENDOSCOPY;   Service: Endoscopy;  Laterality: N/A;  . CORONARY ANGIOPLASTY    . DILATATION & CURETTAGE/HYSTEROSCOPY WITH MYOSURE N/A 09/18/2015   Procedure: DILATATION & CURETTAGE/HYSTEROSCOPY WITH MYOSURE;  Surgeon: Boykin Nearing, MD;  Location: [facility] ORS;  Service: Gynecology;  Laterality: N/A;  . ESOPHAGOGASTRODUODENOSCOPY (EGD) WITH PROPOFOL N/A 04/27/2016   Procedure: ESOPHAGOGASTRODUODENOSCOPY (EGD) WITH PROPOFOL;  Surgeon: Manya Silvas, MD;  Location: [facility] ENDOSCOPY;  Service: Endoscopy;  Laterality: N/A;  . NECK SURGERY     Degenerative Disk Disease and removal of a spinal cyst  . TUBAL LIGATION      FAMILY HISTORY :   Family History  Problem Relation Age of Onset  . Heart disease Mother   . Heart attack Mother   . Hypertension Mother   . Hyperlipidemia Mother   . Breast cancer Sister     SOCIAL HISTORY:   Social History  Substance Use Topics  . Smoking status: Current Every Day Smoker    Packs/day: 0.25    Years: 40.00    Types: Cigarettes  . Smokeless tobacco: Never Used  . Alcohol use No    ALLERGIES:  is allergic to aspirin and hydrochlorothiazide.  MEDICATIONS:  Current Outpatient Prescriptions  Medication Sig Dispense Refill  . allopurinol (ZYLOPRIM) 100 MG tablet Take 100 mg by mouth 2 (two) times daily.    Marland Kitchen  amLODipine (NORVASC) 5 MG tablet Take 1 tablet (5 mg total) by mouth daily. 30 tablet 3  . aspirin 81 MG chewable tablet Chew 81 mg by mouth daily.    Marland Kitchen atorvastatin (LIPITOR) 40 MG tablet Take 40 mg by mouth daily at 6 PM.     . cetirizine (ZYRTEC) 10 MG tablet Take 10 mg by mouth daily.    . colchicine 0.6 MG tablet Take 1 tablet twice daily for 3 days then 1 tablet daily.    . cromolyn (OPTICROM) 4 % ophthalmic solution 1 drop 4 (four) times daily.    . cyclobenzaprine (FLEXERIL) 5 MG tablet Take 5 mg by mouth as needed for muscle spasms. Reported on 05/17/2016    . fluticasone (FLONASE) 50 MCG/ACT nasal spray 1 spray by Each Nare route daily.    .  insulin glargine (LANTUS) 100 UNIT/ML injection Inject 35 Units into the skin every morning. 44 units at bedtime    . Insulin Lispro, Human, (HUMALOG KWIKPEN [place]) Inject 8 Units into the skin every morning. 28 units at lunch and supper    . linaclotide (LINZESS) 72 MCG capsule Take 72 mcg by mouth daily.    Marland Kitchen lisinopril (PRINIVIL,ZESTRIL) 5 MG tablet Take 5 mg by mouth at bedtime.    . magnesium oxide (MAG-OX) 400 MG tablet Take 400 mg by mouth 2 (two) times daily.    . metoprolol tartrate (LOPRESSOR) 25 MG tablet Take 1 tablet (25 mg total) by mouth 2 (two) times daily. 60 tablet 3  . Omega-3 Fatty Acids (FISH OIL MAXIMUM STRENGTH) 1200 MG CAPS Take 1,000 mg by mouth 2 (two) times daily.   0  . pantoprazole (PROTONIX) 20 MG tablet   0  . triamcinolone cream (KENALOG) 0.1 % Apply 1 application topically 2 (two) times daily.     No current facility-administered medications for this visit.    Facility-Administered Medications Ordered in Other Visits  Medication Dose Route Frequency Provider Last Rate Last Dose  . 0.9 %  sodium chloride infusion   Intravenous Once Cammie Sickle, MD   Stopped at 02/22/17 1555    PHYSICAL EXAMINATION:   BP 119/63 (BP Location: Right Arm, Patient Position: Sitting)   Pulse 78   Temp (!) 96 F (35.6 C) (Tympanic)   Resp 16   Wt 165 lb 2 oz (74.9 kg)   BMI 29.25 kg/m   Filed Weights   02/22/17 1402  Weight: 165 lb 2 oz (74.9 kg)    GENERAL: Well-nourished well-developed; Alert, no distress and comfortable.  Alone.  EYES: no pallor or icterus OROPHARYNX: no thrush or ulceration; good dentition  NECK: supple, no masses felt LYMPH:  no palpable lymphadenopathy in the cervical, axillary or inguinal regions LUNGS: clear to auscultation and  No wheeze or crackles HEART/CVS: regular rate & rhythm and no murmurs; No lower extremity edema ABDOMEN:abdomen soft, non-tender and normal bowel sounds Musculoskeletal:no cyanosis of digits and no clubbing   PSYCH: alert & oriented x 3 with fluent speech NEURO: no focal motor/sensory deficits SKIN:  no rashes or significant lesions  LABORATORY DATA:  I have reviewed the data as listed    Component Value Date/Time   NA 138 02/15/2017 1410   NA 139 10/21/2014 1332   K 5.5 (H) 02/15/2017 1410   K 4.4 10/21/2014 1332   CL 107 02/15/2017 1410   CL 108 (H) 10/21/2014 1332   CO2 23 02/15/2017 1410   CO2 24 10/21/2014 1332   GLUCOSE 112 (H)  02/15/2017 1410   GLUCOSE 103 (H) 10/21/2014 1332   BUN 31 (H) 02/15/2017 1410   BUN 21 (H) 10/21/2014 1332   CREATININE 2.04 (H) 02/15/2017 1410   CREATININE 1.45 (H) 10/21/2014 1332   CALCIUM 9.7 02/15/2017 1410   CALCIUM 9.1 10/21/2014 1332   PROT 7.2 02/26/2016 1452   PROT 7.5 10/21/2014 1332   ALBUMIN 3.7 02/26/2016 1452   ALBUMIN 3.6 10/21/2014 1332   AST 24 02/26/2016 1452   AST 15 10/21/2014 1332   ALT 22 02/26/2016 1452   ALT 27 10/21/2014 1332   ALKPHOS 77 02/26/2016 1452   ALKPHOS 94 10/21/2014 1332   BILITOT 0.3 02/26/2016 1452   BILITOT 0.2 10/21/2014 1332   GFRNONAA 24 (L) 02/15/2017 1410   GFRNONAA 39 (L) 10/21/2014 1332   GFRNONAA 27 (L) 05/10/2014 0031   GFRAA 28 (L) 02/15/2017 1410   GFRAA 47 (L) 10/21/2014 1332   GFRAA 32 (L) 05/10/2014 0031    No results found for: SPEP, UPEP  Lab Results  Component Value Date   WBC 8.4 02/15/2017   NEUTROABS 5.6 02/15/2017   HGB 11.2 (L) 02/15/2017   HCT 33.6 (L) 02/15/2017   MCV 87.5 02/15/2017   PLT 527 (H) 02/15/2017      Chemistry      Component Value Date/Time   NA 138 02/15/2017 1410   NA 139 10/21/2014 1332   K 5.5 (H) 02/15/2017 1410   K 4.4 10/21/2014 1332   CL 107 02/15/2017 1410   CL 108 (H) 10/21/2014 1332   CO2 23 02/15/2017 1410   CO2 24 10/21/2014 1332   BUN 31 (H) 02/15/2017 1410   BUN 21 (H) 10/21/2014 1332   CREATININE 2.04 (H) 02/15/2017 1410   CREATININE 1.45 (H) 10/21/2014 1332      Component Value Date/Time   CALCIUM 9.7 02/15/2017 1410    CALCIUM 9.1 10/21/2014 1332   ALKPHOS 77 02/26/2016 1452   ALKPHOS 94 10/21/2014 1332   AST 24 02/26/2016 1452   AST 15 10/21/2014 1332   ALT 22 02/26/2016 1452   ALT 27 10/21/2014 1332   BILITOT 0.3 02/26/2016 1452   BILITOT 0.2 10/21/2014 1332       ASSESSMENT & PLAN:   Anemia due to stage 4 chronic kidney disease ([place]) #  Anemia Iron deficiency- hemoglobin today 11; saturation 19%; ferritin 329.  the context of CKD/fatigue- recommend IV Feraheme infusion again today. Continue PO iron once a day.   #  Lambda light chain ratio slightly elevated-  Likely from a chronic renal insufficiency; unlikely  MGUS.  Ratio 2.27 February 2016. We'll repeat at next visit..   # CKD- creatinine-2/ potassium- 5.5. Follow up with nephrology.   # Follow-up in 6 months/labs and studies 2 days before; possible Feraheme.   TZ:GYFVC, [place]oyne Keel; nephrology.       Cammie Sickle, MD 02/22/2017 3:29 PM

## 2017-06-04 ENCOUNTER — Encounter: Payer: Self-pay | Admitting: Emergency Medicine

## 2017-06-04 ENCOUNTER — Emergency Department
Admission: EM | Admit: 2017-06-04 | Discharge: 2017-06-04 | Disposition: A | Discharge status: Home / Self Care | Payer: Medicare Other | Attending: Emergency Medicine | Admitting: Emergency Medicine

## 2017-06-04 DIAGNOSIS — Z79899 Other long term (current) drug therapy: Secondary | ICD-10-CM | POA: Insufficient documentation

## 2017-06-04 DIAGNOSIS — I501 Left ventricular failure: Secondary | ICD-10-CM | POA: Insufficient documentation

## 2017-06-04 DIAGNOSIS — Z794 Long term (current) use of insulin: Secondary | ICD-10-CM | POA: Insufficient documentation

## 2017-06-04 DIAGNOSIS — I130 Hypertensive heart and chronic kidney disease with heart failure and stage 1 through stage 4 chronic kidney disease, or unspecified chronic kidney disease: Secondary | ICD-10-CM | POA: Insufficient documentation

## 2017-06-04 DIAGNOSIS — L0231 Cutaneous abscess of buttock: Secondary | ICD-10-CM | POA: Diagnosis present

## 2017-06-04 DIAGNOSIS — N184 Chronic kidney disease, stage 4 (severe): Secondary | ICD-10-CM | POA: Insufficient documentation

## 2017-06-04 DIAGNOSIS — F17210 Nicotine dependence, cigarettes, uncomplicated: Secondary | ICD-10-CM | POA: Diagnosis not present

## 2017-06-04 DIAGNOSIS — E1122 Type 2 diabetes mellitus with diabetic chronic kidney disease: Secondary | ICD-10-CM | POA: Diagnosis not present

## 2017-06-04 MED ORDER — SULFAMETHOXAZOLE-TRIMETHOPRIM 800-160 MG PO TABS: 1.0000 | ORAL_TABLET | Freq: Two times a day (BID) | ORAL | 0 refills | Status: DC

## 2017-06-04 MED ORDER — TRAMADOL HCL 50 MG PO TABS: 50.0000 mg | ORAL_TABLET | Freq: Two times a day (BID) | ORAL | 0 refills | Status: DC

## 2017-06-04 MED ORDER — SULFAMETHOXAZOLE-TRIMETHOPRIM 800-160 MG PO TABS
1.0000 | ORAL_TABLET | Freq: Once | ORAL | Status: AC
  Administered 2017-06-04: 1 via ORAL
  Filled 2017-06-04: qty 1

## 2017-06-04 MED ORDER — LIDOCAINE HCL (PF) 1 % IJ SOLN
5.0000 mL | Freq: Once | INTRAMUSCULAR | Status: AC
  Administered 2017-06-04: 5 mL
  Filled 2017-06-04: qty 5

## 2017-06-04 NOTE — ED Triage Notes (Signed)
Patient presents to the ED with a "boil" to her buttocks that patient states seems to be near her spine.  Patient states area "itches and burns."  Patient reports noticing area approx. 2 weeks ago.  Patient states area is getting smaller but continuing to cause pain and discomfort.

## 2017-06-04 NOTE — Discharge Instructions (Signed)
Keep the area clean, dry, and covered as necessary. Apply warm compresses over the dressing to promote healing. Take the antibiotic as directed and the pain medicine as needed. See your provider in 3 days for wound check or this ED to remove packing.

## 2017-06-04 NOTE — ED Provider Notes (Signed)
[facility] Emergency Department Provider Note ____________________________________________  Time seen: 1210  I have reviewed the triage vital signs and the nursing notes.  HISTORY  Chief Complaint  Abscess  HPI Caitlin Bass is a 66 y.o. female presents to the ED for evaluation of a tender, firm area to the right buttock. She initially noted the tender area about 2 weeks prior. She has applied warm compresses and nutmeg, and notes it comes and goes. She denies fevers, chills, sweats, pointing, or spontaneous drainage.   Past Medical History:  Diagnosis Date  . Anemia   . Anxiety   . Arthritis   . CKD (chronic kidney disease), stage IV ([place])   . Coronary artery disease    a. 03/2014 NSTEMI/Cath: LM nl, LAD 65m, 60d, D1 60, D2 nl, d3 40, LCX 100 w/ L->L collats, RCA non dom, min irregs, EF 55%-->Med Rx.  . DDD (degenerative disc disease)    a. s/p upper back surgery spring 2015.  . Diabetes mellitus without complication ([place])   . Diverticulosis   . Fibromyalgia   . GERD (gastroesophageal reflux disease)   . Hiatal hernia   . Hyperlipidemia   . Hypertensive heart disease    a. 03/2015 Echo: 55-60%, mild conc LVH.  Marland Kitchen Syncope and collapse    a. Early 03/2014->did not seek medical attn.  . Tobacco abuse     Patient Active Problem List   Diagnosis Date Noted  . Anemia due to stage 4 chronic kidney disease ([place]) 02/22/2017  . Other iron deficiency anemia 02/26/2016  . Hypertensive heart disease   . Tobacco abuse   . Hyperlipidemia   . Coronary artery disease   . Diabetes mellitus without complication ([place])   . Hypertension   . CKD (chronic kidney disease), stage IV ([place])   . GERD (gastroesophageal reflux disease)   . DDD (degenerative disc disease)     Past Surgical History:  Procedure Laterality Date  . BREAST CYST ASPIRATION Right 2005   NEG  . CARDIAC CATHETERIZATION  06/2005   [facility]  . CARDIAC CATHETERIZATION  04/2014   [facility]  .  COLONOSCOPY WITH PROPOFOL N/A 04/27/2016   Procedure: COLONOSCOPY WITH PROPOFOL;  Surgeon: Manya Silvas, MD;  Location: [facility] ENDOSCOPY;  Service: Endoscopy;  Laterality: N/A;  . CORONARY ANGIOPLASTY    . DILATATION & CURETTAGE/HYSTEROSCOPY WITH MYOSURE N/A 09/18/2015   Procedure: DILATATION & CURETTAGE/HYSTEROSCOPY WITH MYOSURE;  Surgeon: Boykin Nearing, MD;  Location: [facility] ORS;  Service: Gynecology;  Laterality: N/A;  . ESOPHAGOGASTRODUODENOSCOPY (EGD) WITH PROPOFOL N/A 04/27/2016   Procedure: ESOPHAGOGASTRODUODENOSCOPY (EGD) WITH PROPOFOL;  Surgeon: Manya Silvas, MD;  Location: [facility] ENDOSCOPY;  Service: Endoscopy;  Laterality: N/A;  . NECK SURGERY     Degenerative Disk Disease and removal of a spinal cyst  . TUBAL LIGATION      Prior to Admission medications   Medication Sig Start Date End Date Taking? Authorizing Provider  allopurinol (ZYLOPRIM) 100 MG tablet Take 100 mg by mouth 2 (two) times daily.    [provider]  amLODipine (NORVASC) 5 MG tablet Take 1 tablet (5 mg total) by mouth daily. 10/26/15   Wellington Hampshire, MD  aspirin 81 MG chewable tablet Chew 81 mg by mouth daily.    [provider]  atorvastatin (LIPITOR) 40 MG tablet Take 40 mg by mouth daily at 6 PM.     [provider]  cetirizine (ZYRTEC) 10 MG tablet Take 10 mg by mouth daily.  [provider]  colchicine 0.6 MG tablet Take 1 tablet twice daily for 3 days then 1 tablet daily. 08/14/15   [provider]  cromolyn (OPTICROM) 4 % ophthalmic solution 1 drop 4 (four) times daily.    [provider]  cyclobenzaprine (FLEXERIL) 5 MG tablet Take 5 mg by mouth as needed for muscle spasms. Reported on 05/17/2016    [provider]  fluticasone (FLONASE) 50 MCG/ACT nasal spray 1 spray by Each Nare route daily.    [provider]  insulin glargine (LANTUS) 100 UNIT/ML injection Inject 35 Units into the skin every morning. 44 units at bedtime     [provider]  Insulin Lispro, Human, (HUMALOG KWIKPEN [place]) Inject 8 Units into the skin every morning. 28 units at lunch and supper    [provider]  linaclotide (LINZESS) 72 MCG capsule Take 72 mcg by mouth daily. 01/03/17 07/02/17  [provider]  lisinopril (PRINIVIL,ZESTRIL) 5 MG tablet Take 5 mg by mouth at bedtime.    [provider]  magnesium oxide (MAG-OX) 400 MG tablet Take 400 mg by mouth 2 (two) times daily.    [provider]  metoprolol tartrate (LOPRESSOR) 25 MG tablet Take 1 tablet (25 mg total) by mouth 2 (two) times daily. 07/14/16   Wellington Hampshire, MD  Omega-3 Fatty Acids (FISH OIL MAXIMUM STRENGTH) 1200 MG CAPS Take 1,000 mg by mouth 2 (two) times daily.  02/10/16   [provider]  pantoprazole (PROTONIX) 20 MG tablet  07/25/16   [provider]  sulfamethoxazole-trimethoprim (BACTRIM DS,SEPTRA DS) 800-160 MG tablet Take 1 tablet by mouth 2 (two) times daily. 06/04/17   Benn Tarver, Dannielle Karvonen, PA-C  traMADol (ULTRAM) 50 MG tablet Take 1 tablet (50 mg total) by mouth 2 (two) times daily. 06/04/17   Roxanne Orner, Dannielle Karvonen, PA-C  triamcinolone cream (KENALOG) 0.1 % Apply 1 application topically 2 (two) times daily.    [provider]   Allergies Aspirin and Hydrochlorothiazide  Family History  Problem Relation Age of Onset  . Heart disease Mother   . Heart attack Mother   . Hypertension Mother   . Hyperlipidemia Mother   . Breast cancer Sister     Social History Social History  Substance Use Topics  . Smoking status: Current Every Day Smoker    Packs/day: 0.25    Years: 40.00    Types: Cigarettes  . Smokeless tobacco: Never Used  . Alcohol use No   Review of Systems  Constitutional: Negative for fever. Cardiovascular: Negative for chest pain. Respiratory: Negative for shortness of breath. Gastrointestinal: Negative for abdominal pain, vomiting and diarrhea. Genitourinary: Negative for  dysuria. Musculoskeletal: Negative for back pain. Skin: Negative for rash. Right buttock abscess as above.  ____________________________________________  PHYSICAL EXAM:  VITAL SIGNS: ED Triage Vitals [06/04/17 1119]  Enc Vitals Group     BP (!) 152/83     Pulse Rate 78     Resp 16     Temp 97.9 F (36.6 C)     Temp Source Oral     SpO2 99 %     Weight 169 lb (76.7 kg)     Height 5\' 3"  (1.6 m)     Head Circumference      Peak Flow      Pain Score 5     Pain Loc      Pain Edu?      Excl. in [place]?    Constitutional: Alert  and oriented. Well appearing and in no distress. Head: Normocephalic and atraumatic. Cardiovascular: Normal rate, regular rhythm. Normal distal pulses. Respiratory: Normal respiratory effort. No wheezes/rales/rhonchi. Gastrointestinal: Soft and nontender. No distention. Musculoskeletal: Nontender with normal range of motion in all extremities.  Neurologic:  Normal gait without ataxia. Normal speech and language. No gross focal neurologic deficits are appreciated. Skin:  Skin is warm, dry and intact. No rash noted. Right buttock with a focal area of hyperpigmented skin with superficial peeling. There is a palpable, mobile, firmness deep to the surface. No pointing, fluctuance, central punctum or spontaneous drainage noted.  ____________________________________________  PROCEDURES  Bactrim DS 1 PO  INCISION AND DRAINAGE Performed by: Melvenia Needles Consent: Verbal consent obtained. Risks and benefits: risks, benefits and alternatives were discussed Type: abscess  Body area: right buttock  Anesthesia: local infiltration  Incision was made with a scalpel.  Local anesthetic: lidocaine 1% w/o epinephrine  Anesthetic total: 4 ml  Complexity: complex Blunt dissection to break up loculations  Drainage: purulent  Drainage amount: minimal  Packing material: 1/4 in iodoform gauze  Patient tolerance: Patient tolerated the procedure well with  no immediate complications. ____________________________________________  INITIAL IMPRESSION / ASSESSMENT AND PLAN / ED COURSE  Patient with ED evaluation of a gluteal abscess. The area is managed with an I&D procedure and packed & dressed as appropriate. She is placed on Bactrim DS and discharged with wound care instructions. She will return to the ED in 2-3 days for wound check and packing removal, as needed.  ____________________________________________  FINAL CLINICAL IMPRESSION(S) / ED DIAGNOSES  Final diagnoses:  Abscess of buttock, right      Carmie End, Dannielle Karvonen, PA-C 06/05/17 [place], [place], MD 06/05/17 2019

## 2017-07-27 ENCOUNTER — Encounter: Payer: Self-pay | Admitting: Emergency Medicine

## 2017-07-27 ENCOUNTER — Emergency Department
Admission: EM | Admit: 2017-07-27 | Discharge: 2017-07-28 | Disposition: A | Discharge status: Home / Self Care | Payer: Medicare Other | Attending: Emergency Medicine | Admitting: Emergency Medicine

## 2017-07-27 DIAGNOSIS — R899 Unspecified abnormal finding in specimens from other organs, systems and tissues: Secondary | ICD-10-CM | POA: Insufficient documentation

## 2017-07-27 DIAGNOSIS — N184 Chronic kidney disease, stage 4 (severe): Secondary | ICD-10-CM | POA: Diagnosis not present

## 2017-07-27 DIAGNOSIS — I2510 Atherosclerotic heart disease of native coronary artery without angina pectoris: Secondary | ICD-10-CM | POA: Insufficient documentation

## 2017-07-27 DIAGNOSIS — I130 Hypertensive heart and chronic kidney disease with heart failure and stage 1 through stage 4 chronic kidney disease, or unspecified chronic kidney disease: Secondary | ICD-10-CM | POA: Insufficient documentation

## 2017-07-27 DIAGNOSIS — F17210 Nicotine dependence, cigarettes, uncomplicated: Secondary | ICD-10-CM | POA: Insufficient documentation

## 2017-07-27 DIAGNOSIS — Z794 Long term (current) use of insulin: Secondary | ICD-10-CM | POA: Diagnosis not present

## 2017-07-27 DIAGNOSIS — I509 Heart failure, unspecified: Secondary | ICD-10-CM | POA: Insufficient documentation

## 2017-07-27 DIAGNOSIS — Z79899 Other long term (current) drug therapy: Secondary | ICD-10-CM | POA: Insufficient documentation

## 2017-07-27 DIAGNOSIS — Z7982 Long term (current) use of aspirin: Secondary | ICD-10-CM | POA: Diagnosis not present

## 2017-07-27 DIAGNOSIS — E1122 Type 2 diabetes mellitus with diabetic chronic kidney disease: Secondary | ICD-10-CM | POA: Diagnosis not present

## 2017-07-27 LAB — BASIC METABOLIC PANEL
ANION GAP: 9 (ref 5–15)
BUN: 36 mg/dL — AB (ref 6–20)
CHLORIDE: 109 mmol/L (ref 101–111)
CO2: 21 mmol/L — ABNORMAL LOW (ref 22–32)
Calcium: 9.1 mg/dL (ref 8.9–10.3)
Creatinine, Ser: 2.26 mg/dL — ABNORMAL HIGH (ref 0.44–1.00)
GFR calc non Af Amer: 22 mL/min — ABNORMAL LOW (ref >60)
GFR, EST AFRICAN AMERICAN: 25 mL/min — AB (ref >60)
GLUCOSE: 108 mg/dL — AB (ref 65–99)
POTASSIUM: 4.6 mmol/L (ref 3.5–5.1)
SODIUM: 139 mmol/L (ref 135–145)

## 2017-07-27 LAB — CBC
HEMATOCRIT: 35.3 % (ref 35.0–47.0)
HEMOGLOBIN: 11.9 g/dL — AB (ref 12.0–16.0)
MCH: 29.7 pg (ref 26.0–34.0)
MCHC: 33.6 g/dL (ref 32.0–36.0)
MCV: 88.2 fL (ref 80.0–100.0)
Platelets: 289 10*3/uL (ref 150–440)
RBC: 4 MIL/uL (ref 3.80–5.20)
RDW: 16.4 % — ABNORMAL HIGH (ref 11.5–14.5)
WBC: 6.9 10*3/uL (ref 3.6–11.0)

## 2017-07-27 LAB — TROPONIN I: Troponin I: <0.03 ng/mL (ref <0.03)

## 2017-07-27 NOTE — ED Triage Notes (Signed)
Pt comes into the ED via POV where her MD sent her over here for a potassium level of 6.2.  Patient denies any chest pain, shortness of breath, dizziness, N/V.  Patient ambulatory to triage and has even and unlabored respirations.  Patient states she has a h/o high potassium.

## 2017-07-28 NOTE — ED Provider Notes (Signed)
[facility] Emergency Department Provider Note   First MD Initiated Contact with Patient 07/27/17 2354     (approximate)  I have reviewed the triage vital signs and the nursing notes.   HISTORY  Chief Complaint Abnormal Lab   HPI Caitlin Bass is a 66 y.o. female with below of chronic medical conditionsincluding chronic kidney disease presents to the emergency department referred by her primary care provider Dr. Delight Stare for hyperkalemia with a potassium of 6.2 noted on blood work that was performed yesterday.patient denies any symptoms. Patient denies any chest pain shortness breath nausea vomiting diarrhea.   Past Medical History:  Diagnosis Date  . Anemia   . Anxiety   . Arthritis   . CKD (chronic kidney disease), stage IV ([place])   . Coronary artery disease    a. 03/2014 NSTEMI/Cath: LM nl, LAD 68m, 60d, D1 60, D2 nl, d3 40, LCX 100 w/ L->L collats, RCA non dom, min irregs, EF 55%-->Med Rx.  . DDD (degenerative disc disease)    a. s/p upper back surgery spring 2015.  . Diabetes mellitus without complication ([place])   . Diverticulosis   . Fibromyalgia   . GERD (gastroesophageal reflux disease)   . Hiatal hernia   . Hyperlipidemia   . Hypertensive heart disease    a. 03/2015 Echo: 55-60%, mild conc LVH.  Marland Kitchen Syncope and collapse    a. Early 03/2014->did not seek medical attn.  . Tobacco abuse     Patient Active Problem List   Diagnosis Date Noted  . Anemia due to stage 4 chronic kidney disease ([place]) 02/22/2017  . Other iron deficiency anemia 02/26/2016  . Hypertensive heart disease   . Tobacco abuse   . Hyperlipidemia   . Coronary artery disease   . Diabetes mellitus without complication ([place])   . Hypertension   . CKD (chronic kidney disease), stage IV ([place])   . GERD (gastroesophageal reflux disease)   . DDD (degenerative disc disease)     Past Surgical History:  Procedure Laterality Date  . BREAST CYST ASPIRATION Right 2005   NEG    . CARDIAC CATHETERIZATION  06/2005   [facility]  . CARDIAC CATHETERIZATION  04/2014   [facility]  . COLONOSCOPY WITH PROPOFOL N/A 04/27/2016   Procedure: COLONOSCOPY WITH PROPOFOL;  Surgeon: Manya Silvas, MD;  Location: [facility] ENDOSCOPY;  Service: Endoscopy;  Laterality: N/A;  . CORONARY ANGIOPLASTY    . DILATATION & CURETTAGE/HYSTEROSCOPY WITH MYOSURE N/A 09/18/2015   Procedure: DILATATION & CURETTAGE/HYSTEROSCOPY WITH MYOSURE;  Surgeon: Boykin Nearing, MD;  Location: [facility] ORS;  Service: Gynecology;  Laterality: N/A;  . ESOPHAGOGASTRODUODENOSCOPY (EGD) WITH PROPOFOL N/A 04/27/2016   Procedure: ESOPHAGOGASTRODUODENOSCOPY (EGD) WITH PROPOFOL;  Surgeon: Manya Silvas, MD;  Location: [facility] ENDOSCOPY;  Service: Endoscopy;  Laterality: N/A;  . NECK SURGERY     Degenerative Disk Disease and removal of a spinal cyst  . TUBAL LIGATION      Prior to Admission medications   Medication Sig Start Date End Date Taking? Authorizing Provider  allopurinol (ZYLOPRIM) 100 MG tablet Take 100 mg by mouth 2 (two) times daily.    [provider]  amLODipine (NORVASC) 5 MG tablet Take 1 tablet (5 mg total) by mouth daily. 10/26/15   Wellington Hampshire, MD  aspirin 81 MG chewable tablet Chew 81 mg by mouth daily.    [provider]  atorvastatin (LIPITOR) 40 MG tablet Take 40 mg by mouth daily at 6 PM.  [provider]  cetirizine (ZYRTEC) 10 MG tablet Take 10 mg by mouth daily.    [provider]  colchicine 0.6 MG tablet Take 1 tablet twice daily for 3 days then 1 tablet daily. 08/14/15   [provider]  cromolyn (OPTICROM) 4 % ophthalmic solution 1 drop 4 (four) times daily.    [provider]  cyclobenzaprine (FLEXERIL) 5 MG tablet Take 5 mg by mouth as needed for muscle spasms. Reported on 05/17/2016    [provider]  fluticasone (FLONASE) 50 MCG/ACT nasal spray 1 spray by Each Nare route daily.    [provider]  insulin glargine  (LANTUS) 100 UNIT/ML injection Inject 35 Units into the skin every morning. 44 units at bedtime    [provider]  Insulin Lispro, Human, (HUMALOG KWIKPEN [place]) Inject 8 Units into the skin every morning. 28 units at lunch and supper    [provider]  linaclotide (LINZESS) 72 MCG capsule Take 72 mcg by mouth daily. 01/03/17 07/02/17  [provider]  lisinopril (PRINIVIL,ZESTRIL) 5 MG tablet Take 5 mg by mouth at bedtime.    [provider]  magnesium oxide (MAG-OX) 400 MG tablet Take 400 mg by mouth 2 (two) times daily.    [provider]  metoprolol tartrate (LOPRESSOR) 25 MG tablet Take 1 tablet (25 mg total) by mouth 2 (two) times daily. 07/14/16   Wellington Hampshire, MD  Omega-3 Fatty Acids (FISH OIL MAXIMUM STRENGTH) 1200 MG CAPS Take 1,000 mg by mouth 2 (two) times daily.  02/10/16   [provider]  pantoprazole (PROTONIX) 20 MG tablet  07/25/16   [provider]  sulfamethoxazole-trimethoprim (BACTRIM DS,SEPTRA DS) 800-160 MG tablet Take 1 tablet by mouth 2 (two) times daily. 06/04/17   Menshew, Dannielle Karvonen, PA-C  traMADol (ULTRAM) 50 MG tablet Take 1 tablet (50 mg total) by mouth 2 (two) times daily. 06/04/17   Menshew, Dannielle Karvonen, PA-C  triamcinolone cream (KENALOG) 0.1 % Apply 1 application topically 2 (two) times daily.    [provider]    Allergies Aspirin and Hydrochlorothiazide  Family History  Problem Relation Age of Onset  . Heart disease Mother   . Heart attack Mother   . Hypertension Mother   . Hyperlipidemia Mother   . Breast cancer Sister     Social History Social History  Substance Use Topics  . Smoking status: Current Every Day Smoker    Packs/day: 0.25    Years: 40.00    Types: Cigarettes  . Smokeless tobacco: Never Used  . Alcohol use No    Review of Systems Constitutional: No fever/chills Eyes: No visual changes. ENT: No sore throat. Cardiovascular: Denies chest pain. Respiratory:  Denies shortness of breath. Gastrointestinal: No abdominal pain.  No nausea, no vomiting.  No diarrhea.  No constipation. Genitourinary: Negative for dysuria. Musculoskeletal: Negative for neck pain.  Negative for back pain. Integumentary: Negative for rash. Neurological: Negative for headaches, focal weakness or numbness.   ____________________________________________   PHYSICAL EXAM:  VITAL SIGNS: ED Triage Vitals  Enc Vitals Group     BP 07/27/17 2211 123/79     Pulse Rate 07/27/17 2211 91     Resp 07/27/17 2211 16     Temp 07/27/17 2211 98.5 F (36.9 C)     Temp Source 07/27/17 2211 Oral     SpO2 07/27/17 2211 98 %     Weight 07/27/17 2212 77.1 kg (170 lb)  Height 07/27/17 2212 1.6 m (5\' 3" )     Head Circumference --      Peak Flow --      Pain Score --      Pain Loc --      Pain Edu? --      Excl. in [place]? --     Constitutional: Alert and oriented. Well appearing and in no acute distress. Eyes: Conjunctivae are normal.  Head: Atraumatic. Mouth/Throat: Mucous membranes are moist.  Oropharynx non-erythematous. Neck: No stridor.   Cardiovascular: Normal rate, regular rhythm. Good peripheral circulation. Grossly normal heart sounds. Respiratory: Normal respiratory effort.  No retractions. Lungs CTAB. Gastrointestinal: Soft and nontender. No distention.  Musculoskeletal: No lower extremity tenderness nor edema. No gross deformities of extremities. Neurologic:  Normal speech and language. No gross focal neurologic deficits are appreciated.  Skin:  Skin is warm, dry and intact. No rash noted. Psychiatric: Mood and affect are normal. Speech and behavior are normal.  ____________________________________________   LABS (all labs ordered are listed, but only abnormal results are displayed)  Labs Reviewed  BASIC METABOLIC PANEL - Abnormal; Notable for the following:       Result Value   CO2 21 (*)    Glucose, Bld 108 (*)    BUN 36 (*)    Creatinine, Ser 2.26 (*)     GFR calc non Af Amer 22 (*)    GFR calc Af Amer 25 (*)    All other components within normal limits  CBC - Abnormal; Notable for the following:    Hemoglobin 11.9 (*)    RDW 16.4 (*)    All other components within normal limits  TROPONIN I   ____________________________________________  EKG  ED ECG REPORT I, [place] Joreen Swearingin, the attending physician, personally viewed and interpreted this ECG.   Date: 07/28/2017  EKG Time: 10:13 PM  Rate: 86  Rhythm: normal sinus rhythm  Axis: normal  Intervals:normal  ST&T Change: none    Procedures   ____________________________________________   INITIAL IMPRESSION / ASSESSMENT AND PLAN / ED COURSE  Pertinent labs & imaging results that were available during my care of the patient were reviewed by me and considered in my medical decision making (see chart for details).  66 year old female presenting with above stated history of elevated potassium noted on blood draw that was performed at primary care doctor's office yesterday. Patient asymptomatic. EKG revealed no evidence of hyperacute T waves. Laboratory data revealed a normal potassium of 4.6. Patient is advised to follow-up with her primary care provider today      ____________________________________________  FINAL CLINICAL IMPRESSION(S) / ED DIAGNOSES  Final diagnoses:  Abnormal laboratory test     MEDICATIONS GIVEN DURING THIS VISIT:  Medications - No data to display   NEW OUTPATIENT MEDICATIONS STARTED DURING THIS VISIT:  New Prescriptions   No medications on file    Modified Medications   No medications on file    Discontinued Medications   No medications on file     Note:  This document was prepared using Dragon voice recognition software and may include unintentional dictation errors.    Gregor Hams, MD 07/28/17 [phone]

## 2017-07-28 NOTE — ED Notes (Signed)
Pt. Verbalizes understanding of d/c instructions and follow-up. VS stable and pain controlled per pt.  Pt. In NAD at time of d/c and denies further concerns regarding this visit. Pt. Stable at the time of departure from the unit, departing unit by the safest and most appropriate manner per that pt condition and limitations. Pt advised to return to the ED at any time for emergent concerns, or for new/worsening symptoms.   

## 2017-08-24 ENCOUNTER — Other Ambulatory Visit: Payer: Self-pay | Admitting: Nurse Practitioner

## 2017-08-24 DIAGNOSIS — Z1231 Encounter for screening mammogram for malignant neoplasm of breast: Secondary | ICD-10-CM

## 2017-08-25 ENCOUNTER — Inpatient Hospital Stay: Payer: Medicare Other | Attending: Internal Medicine

## 2017-08-25 DIAGNOSIS — Z79899 Other long term (current) drug therapy: Secondary | ICD-10-CM | POA: Insufficient documentation

## 2017-08-25 DIAGNOSIS — D631 Anemia in chronic kidney disease: Secondary | ICD-10-CM | POA: Diagnosis not present

## 2017-08-25 DIAGNOSIS — F17210 Nicotine dependence, cigarettes, uncomplicated: Secondary | ICD-10-CM | POA: Insufficient documentation

## 2017-08-25 DIAGNOSIS — N184 Chronic kidney disease, stage 4 (severe): Secondary | ICD-10-CM | POA: Diagnosis not present

## 2017-08-25 DIAGNOSIS — I1310 Hypertensive heart and chronic kidney disease without heart failure, with stage 1 through stage 4 chronic kidney disease, or unspecified chronic kidney disease: Secondary | ICD-10-CM | POA: Insufficient documentation

## 2017-08-25 LAB — COMPREHENSIVE METABOLIC PANEL
ALT: 28 U/L (ref 14–54)
AST: 26 U/L (ref 15–41)
Albumin: 3.9 g/dL (ref 3.5–5.0)
Alkaline Phosphatase: 95 U/L (ref 38–126)
Anion gap: 8 (ref 5–15)
BUN: 29 mg/dL — AB (ref 6–20)
CHLORIDE: 105 mmol/L (ref 101–111)
CO2: 25 mmol/L (ref 22–32)
Calcium: 9.6 mg/dL (ref 8.9–10.3)
Creatinine, Ser: 1.83 mg/dL — ABNORMAL HIGH (ref 0.44–1.00)
GFR calc Af Amer: 32 mL/min — ABNORMAL LOW (ref >60)
GFR, EST NON AFRICAN AMERICAN: 28 mL/min — AB (ref >60)
Glucose, Bld: 155 mg/dL — ABNORMAL HIGH (ref 65–99)
Potassium: 4.3 mmol/L (ref 3.5–5.1)
SODIUM: 138 mmol/L (ref 135–145)
Total Bilirubin: 0.4 mg/dL (ref 0.3–1.2)
Total Protein: 7.5 g/dL (ref 6.5–8.1)

## 2017-08-25 LAB — CBC
HEMATOCRIT: 37.5 % (ref 35.0–47.0)
HEMOGLOBIN: 12.6 g/dL (ref 12.0–16.0)
MCH: 29.6 pg (ref 26.0–34.0)
MCHC: 33.5 g/dL (ref 32.0–36.0)
MCV: 88.4 fL (ref 80.0–100.0)
Platelets: 332 10*3/uL (ref 150–440)
RBC: 4.25 MIL/uL (ref 3.80–5.20)
RDW: 16.4 % — ABNORMAL HIGH (ref 11.5–14.5)
WBC: 5.6 10*3/uL (ref 3.6–11.0)

## 2017-08-25 LAB — FERRITIN: FERRITIN: 274 ng/mL (ref 11–307)

## 2017-08-25 LAB — IRON AND TIBC
Iron: 76 ug/dL (ref 28–170)
SATURATION RATIOS: 28 % (ref 10.4–31.8)
TIBC: 277 ug/dL (ref 250–450)
UIBC: 201 ug/dL

## 2017-08-28 ENCOUNTER — Inpatient Hospital Stay: Payer: Medicare Other

## 2017-08-28 ENCOUNTER — Inpatient Hospital Stay: Payer: Medicare Other | Attending: Internal Medicine | Admitting: Internal Medicine

## 2017-08-28 VITALS — BP 167/83 | HR 84 | Temp 97.8°F | Resp 18 | Wt 168.6 lb

## 2017-08-28 DIAGNOSIS — I252 Old myocardial infarction: Secondary | ICD-10-CM | POA: Insufficient documentation

## 2017-08-28 DIAGNOSIS — E1122 Type 2 diabetes mellitus with diabetic chronic kidney disease: Secondary | ICD-10-CM | POA: Insufficient documentation

## 2017-08-28 DIAGNOSIS — I1310 Hypertensive heart and chronic kidney disease without heart failure, with stage 1 through stage 4 chronic kidney disease, or unspecified chronic kidney disease: Secondary | ICD-10-CM | POA: Diagnosis not present

## 2017-08-28 DIAGNOSIS — F419 Anxiety disorder, unspecified: Secondary | ICD-10-CM | POA: Insufficient documentation

## 2017-08-28 DIAGNOSIS — I2510 Atherosclerotic heart disease of native coronary artery without angina pectoris: Secondary | ICD-10-CM | POA: Diagnosis not present

## 2017-08-28 DIAGNOSIS — K5900 Constipation, unspecified: Secondary | ICD-10-CM | POA: Diagnosis not present

## 2017-08-28 DIAGNOSIS — Z7982 Long term (current) use of aspirin: Secondary | ICD-10-CM | POA: Insufficient documentation

## 2017-08-28 DIAGNOSIS — M797 Fibromyalgia: Secondary | ICD-10-CM | POA: Diagnosis not present

## 2017-08-28 DIAGNOSIS — K219 Gastro-esophageal reflux disease without esophagitis: Secondary | ICD-10-CM | POA: Diagnosis not present

## 2017-08-28 DIAGNOSIS — Z79899 Other long term (current) drug therapy: Secondary | ICD-10-CM | POA: Diagnosis not present

## 2017-08-28 DIAGNOSIS — N184 Chronic kidney disease, stage 4 (severe): Secondary | ICD-10-CM | POA: Diagnosis not present

## 2017-08-28 DIAGNOSIS — F17210 Nicotine dependence, cigarettes, uncomplicated: Secondary | ICD-10-CM

## 2017-08-28 DIAGNOSIS — K449 Diaphragmatic hernia without obstruction or gangrene: Secondary | ICD-10-CM | POA: Insufficient documentation

## 2017-08-28 DIAGNOSIS — D631 Anemia in chronic kidney disease: Secondary | ICD-10-CM | POA: Insufficient documentation

## 2017-08-28 DIAGNOSIS — Z794 Long term (current) use of insulin: Secondary | ICD-10-CM | POA: Diagnosis not present

## 2017-08-28 DIAGNOSIS — E785 Hyperlipidemia, unspecified: Secondary | ICD-10-CM | POA: Insufficient documentation

## 2017-08-28 NOTE — Assessment & Plan Note (Addendum)
#    Anemia Iron deficiency- hemoglobin today 12; patient fairly asymptomatic. saturation 28%; ferritin 274. Continue PO iron once a day. NO IV iron today.   # CKD- creatinine-1.8; improved. Follow up with nephrology; Dr.Swain.   # Follow-up in 6 months/labsiron studies 2 days before; possible Feraheme.   LM:[id number], Doyne Keel; nephrology.

## 2017-08-28 NOTE — Progress Notes (Signed)
Here for follow up

## 2017-08-28 NOTE — Progress Notes (Signed)
[place] OFFICE PROGRESS NOTE  Patient Care Team: Elisabeth Cara, NP as PCP - General (Nurse Practitioner)   SUMMARY OF ONCOLOGIC HISTORY:  # March 2017- Anemia- Hb 8; plan Iv iron s/p IV iron x2 [[april 2017]; [place] May 2017.   # CKD [? nephrologist as per pt; ? Dr.Swain]/ CAD  # AUG 2016- ELEVATED K/L ratio 1.99; SPEP- Neg.    INTERVAL HISTORY:  66 year old pleasant female patient with above history of elevated Lambda light chain ratio/ IDA sec to CKD is here for follow up.  Patient denies any blood in stools. Patient is on by mouth iron. Intermittent constipation-well controlled.. Denies any blood loss. Not on dialysis. Denies any swelling in the legs. Denies any unusual fatigue.  REVIEW OF SYSTEMS:  A complete 10 point review of system is done which is negative except mentioned above/history of present illness.   PAST MEDICAL HISTORY :  Past Medical History:  Diagnosis Date  . Anemia   . Anxiety   . Arthritis   . CKD (chronic kidney disease), stage IV ([place])   . Coronary artery disease    a. 03/2014 NSTEMI/Cath: LM nl, LAD 38m, 60d, D1 60, D2 nl, d3 40, LCX 100 w/ L->L collats, RCA non dom, min irregs, EF 55%-->Med Rx.  . DDD (degenerative disc disease)    a. s/p upper back surgery spring 2015.  . Diabetes mellitus without complication ([place])   . Diverticulosis   . Fibromyalgia   . GERD (gastroesophageal reflux disease)   . Hiatal hernia   . Hyperlipidemia   . Hypertensive heart disease    a. 03/2015 Echo: 55-60%, mild conc LVH.  Marland Kitchen Syncope and collapse    a. Early 03/2014->did not seek medical attn.  . Tobacco abuse     PAST SURGICAL HISTORY :   Past Surgical History:  Procedure Laterality Date  . BREAST CYST ASPIRATION Right 2005   NEG  . CARDIAC CATHETERIZATION  06/2005   [facility]  . CARDIAC CATHETERIZATION  04/2014   [facility]  . COLONOSCOPY WITH PROPOFOL N/A 04/27/2016   Procedure: COLONOSCOPY WITH PROPOFOL;  Surgeon: Manya Silvas, MD;   Location[facility] ENDOSCOPY;  Service: Endoscopy;  Laterality: N/A;  . CORONARY ANGIOPLASTY    . DILATATION & CURETTAGE/HYSTEROSCOPY WITH MYOSURE N/A 09/18/2015   Procedure: DILATATION & CURETTAGE/HYSTEROSCOPY WITH MYOSURE;  Surgeon: Boykin Nearing, MD;  Location: [facility] ORS;  Service: Gynecology;  Laterality: N/A;  . ESOPHAGOGASTRODUODENOSCOPY (EGD) WITH PROPOFOL N/A 04/27/2016   Procedure: ESOPHAGOGASTRODUODENOSCOPY (EGD) WITH PROPOFOL;  Surgeon: Manya Silvas, MD;  Location: [facility] ENDOSCOPY;  Service: Endoscopy;  Laterality: N/A;  . NECK SURGERY     Degenerative Disk Disease and removal of a spinal cyst  . TUBAL LIGATION      FAMILY HISTORY :   Family History  Problem Relation Age of Onset  . Heart disease Mother   . Heart attack Mother   . Hypertension Mother   . Hyperlipidemia Mother   . Breast cancer Sister     SOCIAL HISTORY:   Social History  Substance Use Topics  . Smoking status: Current Every Day Smoker    Packs/day: 0.25    Years: 40.00    Types: Cigarettes  . Smokeless tobacco: Never Used  . Alcohol use No    ALLERGIES:  is allergic to aspirin and hydrochlorothiazide.  MEDICATIONS:  Current Outpatient Prescriptions  Medication Sig Dispense Refill  . allopurinol (ZYLOPRIM) 100 MG tablet Take 100 mg by mouth 2 (two) times  daily.    . amLODipine (NORVASC) 5 MG tablet Take 1 tablet (5 mg total) by mouth daily. 30 tablet 3  . aspirin 81 MG chewable tablet Chew 81 mg by mouth daily.    Marland Kitchen atorvastatin (LIPITOR) 40 MG tablet Take 40 mg by mouth daily at 6 PM.     . cetirizine (ZYRTEC) 10 MG tablet Take 10 mg by mouth daily.    . colchicine 0.6 MG tablet Take 1 tablet twice daily for 3 days then 1 tablet daily.    . cromolyn (OPTICROM) 4 % ophthalmic solution 1 drop 4 (four) times daily.    . fluticasone (FLONASE) 50 MCG/ACT nasal spray 1 spray by Each Nare route daily.    . insulin glargine (LANTUS) 100 UNIT/ML injection Inject 35 Units into the skin every  morning. 44 units at bedtime    . Insulin Lispro, Human, (HUMALOG KWIKPEN [place]) Inject 8 Units into the skin every morning. 28 units at lunch and supper    . lisinopril (PRINIVIL,ZESTRIL) 5 MG tablet Take 5 mg by mouth at bedtime.    . magnesium oxide (MAG-OX) 400 MG tablet Take 400 mg by mouth 2 (two) times daily.    . metoprolol tartrate (LOPRESSOR) 25 MG tablet Take 1 tablet (25 mg total) by mouth 2 (two) times daily. 60 tablet 3  . Omega-3 Fatty Acids (FISH OIL MAXIMUM STRENGTH) 1200 MG CAPS Take 1,000 mg by mouth 2 (two) times daily.   0  . pantoprazole (PROTONIX) 20 MG tablet   0  . cyclobenzaprine (FLEXERIL) 5 MG tablet Take 5 mg by mouth as needed for muscle spasms. Reported on 05/17/2016    . linaclotide (LINZESS) 72 MCG capsule Take 72 mcg by mouth daily.    . traMADol (ULTRAM) 50 MG tablet Take 1 tablet (50 mg total) by mouth 2 (two) times daily. (Patient not taking: Reported on 08/28/2017) 10 tablet 0  . triamcinolone cream (KENALOG) 0.1 % Apply 1 application topically 2 (two) times daily.     No current facility-administered medications for this visit.     PHYSICAL EXAMINATION:   BP (!) 167/83 (BP Location: Left Arm, Patient Position: Sitting)   Pulse 84   Temp 97.8 F (36.6 C) (Tympanic)   Resp 18   Wt 168 lb 9.6 oz (76.5 kg)   BMI 29.87 kg/m   Filed Weights   08/28/17 1402  Weight: 168 lb 9.6 oz (76.5 kg)    GENERAL: Well-nourished well-developed; Alert, no distress and comfortable.  Alone.  EYES: no pallor or icterus OROPHARYNX: no thrush or ulceration; good dentition  NECK: supple, no masses felt LYMPH:  no palpable lymphadenopathy in the cervical, axillary or inguinal regions LUNGS: clear to auscultation and  No wheeze or crackles HEART/CVS: regular rate & rhythm and no murmurs; No lower extremity edema ABDOMEN:abdomen soft, non-tender and normal bowel sounds Musculoskeletal:no cyanosis of digits and no clubbing  PSYCH: alert & oriented x 3 with fluent  speech NEURO: no focal motor/sensory deficits SKIN:  no rashes or significant lesions  LABORATORY DATA:  I have reviewed the data as listed    Component Value Date/Time   NA 138 08/25/2017 1358   NA 139 10/21/2014 1332   K 4.3 08/25/2017 1358   K 4.4 10/21/2014 1332   CL 105 08/25/2017 1358   CL 108 (H) 10/21/2014 1332   CO2 25 08/25/2017 1358   CO2 24 10/21/2014 1332   GLUCOSE 155 (H) 08/25/2017 1358   GLUCOSE 103 (H) 10/21/2014  1332   BUN 29 (H) 08/25/2017 1358   BUN 21 (H) 10/21/2014 1332   CREATININE 1.83 (H) 08/25/2017 1358   CREATININE 1.45 (H) 10/21/2014 1332   CALCIUM 9.6 08/25/2017 1358   CALCIUM 9.1 10/21/2014 1332   PROT 7.5 08/25/2017 1358   PROT 7.5 10/21/2014 1332   ALBUMIN 3.9 08/25/2017 1358   ALBUMIN 3.6 10/21/2014 1332   AST 26 08/25/2017 1358   AST 15 10/21/2014 1332   ALT 28 08/25/2017 1358   ALT 27 10/21/2014 1332   ALKPHOS 95 08/25/2017 1358   ALKPHOS 94 10/21/2014 1332   BILITOT 0.4 08/25/2017 1358   BILITOT 0.2 10/21/2014 1332   GFRNONAA 28 (L) 08/25/2017 1358   GFRNONAA 39 (L) 10/21/2014 1332   GFRNONAA 27 (L) 05/10/2014 0031   GFRAA 32 (L) 08/25/2017 1358   GFRAA 47 (L) 10/21/2014 1332   GFRAA 32 (L) 05/10/2014 0031    No results found for: SPEP, UPEP  Lab Results  Component Value Date   WBC 5.6 08/25/2017   NEUTROABS 5.6 02/15/2017   HGB 12.6 08/25/2017   HCT 37.5 08/25/2017   MCV 88.4 08/25/2017   PLT 332 08/25/2017      Chemistry      Component Value Date/Time   NA 138 08/25/2017 1358   NA 139 10/21/2014 1332   K 4.3 08/25/2017 1358   K 4.4 10/21/2014 1332   CL 105 08/25/2017 1358   CL 108 (H) 10/21/2014 1332   CO2 25 08/25/2017 1358   CO2 24 10/21/2014 1332   BUN 29 (H) 08/25/2017 1358   BUN 21 (H) 10/21/2014 1332   CREATININE 1.83 (H) 08/25/2017 1358   CREATININE 1.45 (H) 10/21/2014 1332      Component Value Date/Time   CALCIUM 9.6 08/25/2017 1358   CALCIUM 9.1 10/21/2014 1332   ALKPHOS 95 08/25/2017 1358    ALKPHOS 94 10/21/2014 1332   AST 26 08/25/2017 1358   AST 15 10/21/2014 1332   ALT 28 08/25/2017 1358   ALT 27 10/21/2014 1332   BILITOT 0.4 08/25/2017 1358   BILITOT 0.2 10/21/2014 1332       ASSESSMENT & PLAN:   Anemia due to stage 4 chronic kidney disease (HCC) #  Anemia Iron deficiency- hemoglobin today 12; patient fairly asymptomatic. saturation 28%; ferritin 274. Continue PO iron once a day. NO IV iron today.   # CKD- creatinine-1.8; improved. Follow up with nephrology; Dr.Swain.   # Follow-up in 6 months/labsiron studies 2 days before; possible Feraheme.   WV:[id number], Doyne Keel; nephrology.       Cammie Sickle, MD 08/28/2017 5:50 PM

## 2017-09-01 ENCOUNTER — Ambulatory Visit
Admission: RE | Admit: 2017-09-01 | Discharge: 2017-09-01 | Disposition: A | Discharge status: Home / Self Care | Payer: Medicare Other | Source: Ambulatory Visit | Attending: Nurse Practitioner | Admitting: Nurse Practitioner

## 2017-09-01 DIAGNOSIS — Z1231 Encounter for screening mammogram for malignant neoplasm of breast: Secondary | ICD-10-CM | POA: Diagnosis not present

## 2017-09-21 ENCOUNTER — Other Ambulatory Visit: Payer: Self-pay | Admitting: Family Medicine

## 2017-09-21 DIAGNOSIS — Z13820 Encounter for screening for osteoporosis: Secondary | ICD-10-CM

## 2017-10-31 ENCOUNTER — Ambulatory Visit
Admission: RE | Admit: 2017-10-31 | Discharge: 2017-10-31 | Disposition: A | Discharge status: Home / Self Care | Payer: Medicare Other | Source: Ambulatory Visit | Attending: Family Medicine | Admitting: Family Medicine

## 2017-10-31 DIAGNOSIS — M85851 Other specified disorders of bone density and structure, right thigh: Secondary | ICD-10-CM | POA: Insufficient documentation

## 2017-10-31 DIAGNOSIS — Z13820 Encounter for screening for osteoporosis: Secondary | ICD-10-CM | POA: Diagnosis present

## 2018-02-08 ENCOUNTER — Ambulatory Visit (INDEPENDENT_AMBULATORY_CARE_PROVIDER_SITE_OTHER): Payer: Medicare Other | Admitting: Cardiovascular Disease

## 2018-02-08 ENCOUNTER — Encounter: Payer: Self-pay | Admitting: Cardiovascular Disease

## 2018-02-08 VITALS — BP 140/70 | HR 82 | Ht 63.0 in | Wt 169.8 lb

## 2018-02-08 DIAGNOSIS — I2510 Atherosclerotic heart disease of native coronary artery without angina pectoris: Secondary | ICD-10-CM

## 2018-02-08 DIAGNOSIS — I10 Essential (primary) hypertension: Secondary | ICD-10-CM

## 2018-02-08 DIAGNOSIS — Z720 Tobacco use: Secondary | ICD-10-CM | POA: Diagnosis not present

## 2018-02-08 DIAGNOSIS — E782 Mixed hyperlipidemia: Secondary | ICD-10-CM

## 2018-02-08 NOTE — Patient Instructions (Signed)
Medication Instructions: Continue same medications.   Labwork: None.   Procedures/Testing: None.   Follow-Up: 1 year with Dr. Hazell Siwik.   Any Additional Special Instructions Will Be Listed Below (If Applicable).     If you need a refill on your cardiac medications before your next appointment, please call your pharmacy.   

## 2018-02-08 NOTE — Progress Notes (Signed)
Cardiology Office Note   Date:  02/08/2018   ID:  Caitlin Bass, DOB 10-18-1951, MRN [id number]  PCP:  Elisabeth Cara, NP  Cardiologist:   Kathlyn Sacramento, MD   Chief Complaint  Patient presents with  . other    12 month follow up. Meds reviewed by the pt. verbally. Pt. c/o no energy.       History of Present Illness: Caitlin Bass is a 67 y.o. female who presents for a followup visit regarding coronary artery disease.  She had non-ST elevation myocardial infarction in May 2015. Echo showed nl LV fxn. Cardiac catheterization showed an occluded LCX with L-L collats and otherwise nonobstructive CAD. She was medically managed . 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  She has multiple other chronic conditions that include essential hypertension, hyperlipidemia and sleep apnea on CPAP. She has been doing reasonably well and denies any chest pain or worsening dyspnea.  Unfortunately, she continues to smoke.   Echocardiogram in March 2018 showed normal LV systolic function, mild mitral regurgitation and no evidence of pulmonary hypertension.    Past Medical History:  Diagnosis Date  . Anemia   . Anxiety   . Arthritis   . CKD (chronic kidney disease), stage IV ([place])   . Coronary artery disease    a. 03/2014 NSTEMI/Cath: LM nl, LAD 73m, 60d, D1 60, D2 nl, d3 40, LCX 100 w/ L->L collats, RCA non dom, min irregs, EF 55%-->Med Rx.  . DDD (degenerative disc disease)    a. s/p upper back surgery spring 2015.  . Diabetes mellitus without complication ([place])   . Diverticulosis   . Fibromyalgia   . GERD (gastroesophageal reflux disease)   . Hiatal hernia   . Hyperlipidemia   . Hypertensive heart disease    a. 03/2015 Echo: 55-60%, mild conc LVH.  Marland Kitchen Syncope and collapse    a. Early 03/2014->did not seek medical attn.  . Tobacco abuse     Past Surgical History:  Procedure Laterality Date  . BREAST CYST ASPIRATION Right 2005   NEG  .  CARDIAC CATHETERIZATION  06/2005   [facility]  . CARDIAC CATHETERIZATION  04/2014   [facility]  . COLONOSCOPY WITH PROPOFOL N/A 04/27/2016   Procedure: COLONOSCOPY WITH PROPOFOL;  Surgeon: Manya Silvas, MD;  Location: [facility] ENDOSCOPY;  Service: Endoscopy;  Laterality: N/A;  . CORONARY ANGIOPLASTY    . DILATATION & CURETTAGE/HYSTEROSCOPY WITH MYOSURE N/A 09/18/2015   Procedure: DILATATION & CURETTAGE/HYSTEROSCOPY WITH MYOSURE;  Surgeon: Boykin Nearing, MD;  Location: [facility] ORS;  Service: Gynecology;  Laterality: N/A;  . ESOPHAGOGASTRODUODENOSCOPY (EGD) WITH PROPOFOL N/A 04/27/2016   Procedure: ESOPHAGOGASTRODUODENOSCOPY (EGD) WITH PROPOFOL;  Surgeon: Manya Silvas, MD;  Location: [facility] ENDOSCOPY;  Service: Endoscopy;  Laterality: N/A;  . NECK SURGERY     Degenerative Disk Disease and removal of a spinal cyst  . TUBAL LIGATION       Current Outpatient Medications  Medication Sig Dispense Refill  . allopurinol (ZYLOPRIM) 100 MG tablet Take 100 mg by mouth 2 (two) times daily.    Marland Kitchen amLODipine (NORVASC) 5 MG tablet Take 1 tablet (5 mg total) by mouth daily. 30 tablet 3  . aspirin 81 MG chewable tablet Chew 81 mg by mouth daily.    Marland Kitchen atorvastatin (LIPITOR) 40 MG tablet Take 40 mg by mouth daily at 6 PM.     . Calcium Carb-Cholecalciferol (CALCIUM 1000 + D PO) Take by mouth.    Marland Kitchen  cetirizine (ZYRTEC) 10 MG tablet Take 10 mg by mouth daily.    . colchicine 0.6 MG tablet Take 1 tablet twice daily for 3 days then 1 tablet daily.    . cromolyn (OPTICROM) 4 % ophthalmic solution 1 drop 4 (four) times daily.    . cyclobenzaprine (FLEXERIL) 5 MG tablet Take 5 mg by mouth as needed for muscle spasms. Reported on 05/17/2016    . fluticasone (FLONASE) 50 MCG/ACT nasal spray 1 spray by Each Nare route daily.    . insulin glargine (LANTUS) 100 UNIT/ML injection Inject 35 Units into the skin every morning. 44 units at bedtime.    . Insulin Lispro, Human, (HUMALOG KWIKPEN [place]) Inject 8 Units into the skin every  morning. 28 units at lunch and supper    . linaclotide (LINZESS) 72 MCG capsule Take 72 mcg by mouth daily.    Marland Kitchen lisinopril (PRINIVIL,ZESTRIL) 5 MG tablet Take 5 mg by mouth at bedtime.    . magnesium oxide (MAG-OX) 400 MG tablet Take 400 mg by mouth 2 (two) times daily.    . metoprolol tartrate (LOPRESSOR) 25 MG tablet Take 1 tablet (25 mg total) by mouth 2 (two) times daily. 60 tablet 3  . Omega-3 Fatty Acids (FISH OIL MAXIMUM STRENGTH) 1200 MG CAPS Take 1,000 mg by mouth 2 (two) times daily.   0  . pantoprazole (PROTONIX) 20 MG tablet   0  . traMADol (ULTRAM) 50 MG tablet Take 1 tablet (50 mg total) by mouth 2 (two) times daily. 10 tablet 0  . triamcinolone cream (KENALOG) 0.1 % Apply 1 application topically 2 (two) times daily.     No current facility-administered medications for this visit.     Allergies:   Aspirin and Hydrochlorothiazide    Social History:  The patient  reports that she has been smoking cigarettes.  She has a 10.00 pack-year smoking history. she has never used smokeless tobacco. She reports that she does not drink alcohol or use drugs.   Family History:  The patient's family history includes Breast cancer in her sister; Heart attack in her mother; Heart disease in her mother; Hyperlipidemia in her mother; Hypertension in her mother.    ROS:  Please see the history of present illness.   Otherwise, review of systems are positive for none.   All other systems are reviewed and negative.    PHYSICAL EXAM: VS:  BP 140/70 (BP Location: Left Arm, Patient Position: Sitting, Cuff Size: Normal)   Pulse 82   Ht 5\' 3"  (1.6 m)   Wt 169 lb 12 oz (77 kg)   BMI 30.07 kg/m  , BMI Body mass index is 30.07 kg/m. GEN: Well nourished, well developed, in no acute distress  HEENT: normal  Neck: no JVD, carotid bruits, or masses Cardiac: RRR; no  rubs, or gallops,no edema . 2/6 SEM at the aortic area.  Respiratory:  clear to auscultation bilaterally, normal work of breathing GI:  soft, nontender, nondistended, + BS MS: no deformity or atrophy  Skin: warm and dry, no rash Neuro:  Strength and sensation are intact Psych: euthymic mood, full affect   EKG:  EKG is ordered today. The ekg ordered today demonstrates normal sinus rhythm with left atrial enlargement, left axis deviation and lateral T wave changes suggestive of ischemia.  No significant change compared to most recent EKG.  Recent Labs: 08/25/2017: ALT 28; BUN 29; Creatinine, Ser 1.83; Hemoglobin 12.6; Platelets 332; Potassium 4.3; Sodium 138    Lipid Panel  Component Value Date/Time   CHOL 291 (H) 07/23/2013 0429   TRIG 550 (H) 07/23/2013 0429   HDL 37 (L) 07/23/2013 0429   VLDL SEE COMMENT 07/23/2013 0429   LDLCALC SEE COMMENT 07/23/2013 0429      Wt Readings from Last 3 Encounters:  02/08/18 169 lb 12 oz (77 kg)  08/28/17 168 lb 9.6 oz (76.5 kg)  07/27/17 170 lb (77.1 kg)        ASSESSMENT AND PLAN:  1.  Coronary artery disease involving native coronary arteries without angina:  She is doing well from a cardiac standpoint. Continue medical therapy.  2. Essential hypertension: Blood pressure is  reasonably controlled now on amlodipine and metoprolol.  3. Hyperlipidemia: Currently on atorvastatin 40 mg once daily. This is being followed by her primary care physician.  I recommend a target LDL of less than 70.  4. Chronic kidney disease: Followed by nephrology.  5. Tobacco use:  I again discussed with her the importance of smoking cessation.  6.  Functional murmur: Likely due to aortic sclerosis.  Echocardiogram last year showed no significant valvular abnormalities.  Disposition:   FU with me in 12 months  Signed,  Kathlyn Sacramento, MD  02/08/2018 2:55 PM    [place]

## 2018-02-19 ENCOUNTER — Inpatient Hospital Stay: Payer: Medicare Other | Attending: Internal Medicine

## 2018-02-19 DIAGNOSIS — D631 Anemia in chronic kidney disease: Secondary | ICD-10-CM | POA: Insufficient documentation

## 2018-02-19 DIAGNOSIS — N184 Chronic kidney disease, stage 4 (severe): Secondary | ICD-10-CM | POA: Diagnosis not present

## 2018-02-19 LAB — CBC WITH DIFFERENTIAL/PLATELET
Basophils Absolute: 0.1 10*3/uL (ref 0–0.1)
Basophils Relative: 1 %
EOS ABS: 0.1 10*3/uL (ref 0–0.7)
EOS PCT: 2 %
HCT: 36.5 % (ref 35.0–47.0)
Hemoglobin: 12.1 g/dL (ref 12.0–16.0)
LYMPHS ABS: 2 10*3/uL (ref 1.0–3.6)
LYMPHS PCT: 32 %
MCH: 29.5 pg (ref 26.0–34.0)
MCHC: 33 g/dL (ref 32.0–36.0)
MCV: 89.3 fL (ref 80.0–100.0)
MONO ABS: 0.5 10*3/uL (ref 0.2–0.9)
MONOS PCT: 8 %
Neutro Abs: 3.6 10*3/uL (ref 1.4–6.5)
Neutrophils Relative %: 57 %
PLATELETS: 309 10*3/uL (ref 150–440)
RBC: 4.09 MIL/uL (ref 3.80–5.20)
RDW: 16.2 % — AB (ref 11.5–14.5)
WBC: 6.4 10*3/uL (ref 3.6–11.0)

## 2018-02-19 LAB — BASIC METABOLIC PANEL
Anion gap: 9 (ref 5–15)
BUN: 28 mg/dL — AB (ref 6–20)
CHLORIDE: 105 mmol/L (ref 101–111)
CO2: 23 mmol/L (ref 22–32)
CREATININE: 1.92 mg/dL — AB (ref 0.44–1.00)
Calcium: 8.4 mg/dL — ABNORMAL LOW (ref 8.9–10.3)
GFR calc Af Amer: 30 mL/min — ABNORMAL LOW (ref >60)
GFR calc non Af Amer: 26 mL/min — ABNORMAL LOW (ref >60)
GLUCOSE: 132 mg/dL — AB (ref 65–99)
POTASSIUM: 4.1 mmol/L (ref 3.5–5.1)
SODIUM: 137 mmol/L (ref 135–145)

## 2018-02-19 LAB — FERRITIN: Ferritin: 324 ng/mL — ABNORMAL HIGH (ref 11–307)

## 2018-02-19 LAB — IRON AND TIBC
Iron: 84 ug/dL (ref 28–170)
SATURATION RATIOS: 32 % — AB (ref 10.4–31.8)
TIBC: 264 ug/dL (ref 250–450)
UIBC: 180 ug/dL

## 2018-02-26 ENCOUNTER — Encounter: Payer: Self-pay | Admitting: Internal Medicine

## 2018-02-26 ENCOUNTER — Inpatient Hospital Stay: Payer: Medicare Other | Attending: Internal Medicine | Admitting: Internal Medicine

## 2018-02-26 ENCOUNTER — Inpatient Hospital Stay: Payer: Medicare Other

## 2018-02-26 VITALS — BP 154/86 | HR 77 | Temp 97.4°F | Resp 16 | Wt 169.0 lb

## 2018-02-26 DIAGNOSIS — D509 Iron deficiency anemia, unspecified: Secondary | ICD-10-CM | POA: Diagnosis not present

## 2018-02-26 DIAGNOSIS — N184 Chronic kidney disease, stage 4 (severe): Secondary | ICD-10-CM | POA: Diagnosis not present

## 2018-02-26 DIAGNOSIS — D631 Anemia in chronic kidney disease: Secondary | ICD-10-CM | POA: Diagnosis not present

## 2018-02-26 NOTE — Progress Notes (Signed)
[facility] OFFICE PROGRESS NOTE  Patient Care Team: Elisabeth Cara, NP as PCP - General (Nurse Practitioner)   SUMMARY OF ONCOLOGIC HISTORY:  # March 2017- Anemia- Hb 8; plan Iv iron s/p IV iron x2 [[april 2017]; [place] May 2017.   # CKD [? nephrologist as per pt; ? Dr.Swain]/ CAD  # AUG 2016- ELEVATED K/L ratio 1.99; SPEP- Neg.    INTERVAL HISTORY:  67 year old pleasant female patient with above history of anemia secondary to iron deficiency/CKD.  Patient denies any significant fatigue.  She continues to be on iron pills without any difficulty//GI upset.  Patient denies any blood in stools.  Not on dialysis. Denies any swelling in the legs. Denies any unusual fatigue.  REVIEW OF SYSTEMS:  A complete 10 point review of system is done which is negative except mentioned above/history of present illness.   PAST MEDICAL HISTORY :  Past Medical History:  Diagnosis Date  . Anemia   . Anxiety   . Arthritis   . CKD (chronic kidney disease), stage IV ([place])   . Coronary artery disease    a. 03/2014 NSTEMI/Cath: LM nl, LAD 80m, 60d, D1 60, D2 nl, d3 40, LCX 100 w/ L->L collats, RCA non dom, min irregs, EF 55%-->Med Rx.  . DDD (degenerative disc disease)    a. s/p upper back surgery spring 2015.  . Diabetes mellitus without complication ([place])   . Diverticulosis   . Fibromyalgia   . GERD (gastroesophageal reflux disease)   . Hiatal hernia   . Hyperlipidemia   . Hypertensive heart disease    a. 03/2015 Echo: 55-60%, mild conc LVH.  Marland Kitchen Syncope and collapse    a. Early 03/2014->did not seek medical attn.  . Tobacco abuse     PAST SURGICAL HISTORY :   Past Surgical History:  Procedure Laterality Date  . BREAST CYST ASPIRATION Right 2005   NEG  . CARDIAC CATHETERIZATION  06/2005   [facility]  . CARDIAC CATHETERIZATION  04/2014   [facility]  . COLONOSCOPY WITH PROPOFOL N/A 04/27/2016   Procedure: COLONOSCOPY WITH PROPOFOL;  Surgeon: Manya Silvas, MD;  Location: [facility]  ENDOSCOPY;  Service: Endoscopy;  Laterality: N/A;  . CORONARY ANGIOPLASTY    . DILATATION & CURETTAGE/HYSTEROSCOPY WITH MYOSURE N/A 09/18/2015   Procedure: DILATATION & CURETTAGE/HYSTEROSCOPY WITH MYOSURE;  Surgeon: Boykin Nearing, MD;  Location: [facility] ORS;  Service: Gynecology;  Laterality: N/A;  . ESOPHAGOGASTRODUODENOSCOPY (EGD) WITH PROPOFOL N/A 04/27/2016   Procedure: ESOPHAGOGASTRODUODENOSCOPY (EGD) WITH PROPOFOL;  Surgeon: Manya Silvas, MD;  Location: [facility] ENDOSCOPY;  Service: Endoscopy;  Laterality: N/A;  . NECK SURGERY     Degenerative Disk Disease and removal of a spinal cyst  . TUBAL LIGATION      FAMILY HISTORY :   Family History  Problem Relation Age of Onset  . Heart disease Mother   . Heart attack Mother   . Hypertension Mother   . Hyperlipidemia Mother   . Breast cancer Sister     SOCIAL HISTORY:   Social History   Tobacco Use  . Smoking status: Current Every Day Smoker    Packs/day: 0.25    Years: 40.00    Pack years: 10.00    Types: Cigarettes  . Smokeless tobacco: Never Used  Substance Use Topics  . Alcohol use: No  . Drug use: No    ALLERGIES:  is allergic to aspirin and hydrochlorothiazide.  MEDICATIONS:  Current Outpatient Medications  Medication Sig Dispense Refill  . allopurinol (  ZYLOPRIM) 100 MG tablet Take 100 mg by mouth 2 (two) times daily.    Marland Kitchen amLODipine (NORVASC) 5 MG tablet Take 1 tablet (5 mg total) by mouth daily. 30 tablet 3  . aspirin 81 MG chewable tablet Chew 81 mg by mouth daily.    Marland Kitchen atorvastatin (LIPITOR) 40 MG tablet Take 40 mg by mouth daily at 6 PM.     . Calcium Carb-Cholecalciferol (CALCIUM 1000 + D PO) Take by mouth.    . cetirizine (ZYRTEC) 10 MG tablet Take 10 mg by mouth daily.    . colchicine 0.6 MG tablet Take 1 tablet twice daily for 3 days then 1 tablet daily.    . cromolyn (OPTICROM) 4 % ophthalmic solution 1 drop 4 (four) times daily.    . cyclobenzaprine (FLEXERIL) 5 MG tablet Take 5 mg by mouth as  needed for muscle spasms. Reported on 05/17/2016    . fluticasone (FLONASE) 50 MCG/ACT nasal spray 1 spray by Each Nare route daily.    . insulin glargine (LANTUS) 100 UNIT/ML injection Inject 35 Units into the skin every morning. 44 units at bedtime.    . Insulin Lispro, Human, (HUMALOG KWIKPEN ) Inject 8 Units into the skin every morning. 28 units at lunch and supper    . linaclotide (LINZESS) 72 MCG capsule Take 72 mcg by mouth daily.    Marland Kitchen lisinopril (PRINIVIL,ZESTRIL) 5 MG tablet Take 5 mg by mouth at bedtime.    . magnesium oxide (MAG-OX) 400 MG tablet Take 400 mg by mouth 2 (two) times daily.    . metoprolol tartrate (LOPRESSOR) 25 MG tablet Take 1 tablet (25 mg total) by mouth 2 (two) times daily. 60 tablet 3  . Omega-3 Fatty Acids (FISH OIL MAXIMUM STRENGTH) 1200 MG CAPS Take 1,000 mg by mouth 2 (two) times daily.   0  . pantoprazole (PROTONIX) 20 MG tablet   0  . traMADol (ULTRAM) 50 MG tablet Take 1 tablet (50 mg total) by mouth 2 (two) times daily. 10 tablet 0  . triamcinolone cream (KENALOG) 0.1 % Apply 1 application topically 2 (two) times daily.     No current facility-administered medications for this visit.     PHYSICAL EXAMINATION:   BP (!) 154/86 (BP Location: Left Arm, Patient Position: Sitting)   Pulse 77   Temp (!) 97.4 F (36.3 C) (Tympanic)   Resp 16   Wt 169 lb (76.7 kg)   BMI 29.94 kg/m   Filed Weights   02/26/18 1358  Weight: 169 lb (76.7 kg)    GENERAL: Well-nourished well-developed; Alert, no distress and comfortable.  Alone.  EYES: no pallor or icterus OROPHARYNX: no thrush or ulceration; good dentition  NECK: supple, no masses felt LYMPH:  no palpable lymphadenopathy in the cervical, axillary or inguinal regions LUNGS: clear to auscultation and  No wheeze or crackles HEART/CVS: regular rate & rhythm and no murmurs; No lower extremity edema ABDOMEN:abdomen soft, non-tender and normal bowel sounds Musculoskeletal:no cyanosis of digits and no  clubbing  PSYCH: alert & oriented x 3 with fluent speech NEURO: no focal motor/sensory deficits SKIN:  no rashes or significant lesions  LABORATORY DATA:  I have reviewed the data as listed    Component Value Date/Time   NA 137 02/19/2018 1256   NA 139 10/21/2014 1332   K 4.1 02/19/2018 1256   K 4.4 10/21/2014 1332   CL 105 02/19/2018 1256   CL 108 (H) 10/21/2014 1332   CO2 23 02/19/2018 1256  CO2 24 10/21/2014 1332   GLUCOSE 132 (H) 02/19/2018 1256   GLUCOSE 103 (H) 10/21/2014 1332   BUN 28 (H) 02/19/2018 1256   BUN 21 (H) 10/21/2014 1332   CREATININE 1.92 (H) 02/19/2018 1256   CREATININE 1.45 (H) 10/21/2014 1332   CALCIUM 8.4 (L) 02/19/2018 1256   CALCIUM 9.1 10/21/2014 1332   PROT 7.5 08/25/2017 1358   PROT 7.5 10/21/2014 1332   ALBUMIN 3.9 08/25/2017 1358   ALBUMIN 3.6 10/21/2014 1332   AST 26 08/25/2017 1358   AST 15 10/21/2014 1332   ALT 28 08/25/2017 1358   ALT 27 10/21/2014 1332   ALKPHOS 95 08/25/2017 1358   ALKPHOS 94 10/21/2014 1332   BILITOT 0.4 08/25/2017 1358   BILITOT 0.2 10/21/2014 1332   GFRNONAA 26 (L) 02/19/2018 1256   GFRNONAA 39 (L) 10/21/2014 1332   GFRNONAA 27 (L) 05/10/2014 0031   GFRAA 30 (L) 02/19/2018 1256   GFRAA 47 (L) 10/21/2014 1332   GFRAA 32 (L) 05/10/2014 0031    No results found for: SPEP, UPEP  Lab Results  Component Value Date   WBC 6.4 02/19/2018   NEUTROABS 3.6 02/19/2018   HGB 12.1 02/19/2018   HCT 36.5 02/19/2018   MCV 89.3 02/19/2018   PLT 309 02/19/2018      Chemistry      Component Value Date/Time   NA 137 02/19/2018 1256   NA 139 10/21/2014 1332   K 4.1 02/19/2018 1256   K 4.4 10/21/2014 1332   CL 105 02/19/2018 1256   CL 108 (H) 10/21/2014 1332   CO2 23 02/19/2018 1256   CO2 24 10/21/2014 1332   BUN 28 (H) 02/19/2018 1256   BUN 21 (H) 10/21/2014 1332   CREATININE 1.92 (H) 02/19/2018 1256   CREATININE 1.45 (H) 10/21/2014 1332      Component Value Date/Time   CALCIUM 8.4 (L) 02/19/2018 1256    CALCIUM 9.1 10/21/2014 1332   ALKPHOS 95 08/25/2017 1358   ALKPHOS 94 10/21/2014 1332   AST 26 08/25/2017 1358   AST 15 10/21/2014 1332   ALT 28 08/25/2017 1358   ALT 27 10/21/2014 1332   BILITOT 0.4 08/25/2017 1358   BILITOT 0.2 10/21/2014 1332       ASSESSMENT & PLAN:   Anemia due to stage 4 chronic kidney disease ([place]) #  Anemia Iron deficiency- hemoglobin today 12; patient fairly asymptomatic. saturation 34%; ferritin 324. Continue PO iron once a day. NO IV iron today.   # CKD- creatinine-1.8;STABLE. Follow up with nephrology; Dr.Swain.   # Blood pressure- slightly elevated- defer to PCP/neprhology.   # Follow-up in 6 months/labs iron studies 2 days before; possible Feraheme.   ES:PQZRA, Doyne Keel; nephrology.       Cammie Sickle, MD 02/27/2018 8:56 AM

## 2018-02-26 NOTE — Assessment & Plan Note (Addendum)
#    Anemia Iron deficiency- hemoglobin today 12; patient fairly asymptomatic. saturation 34%; ferritin 324. Continue PO iron once a day. NO IV iron today.   # CKD- creatinine-1.8;STABLE. Follow up with nephrology; Dr.Swain.   # Blood pressure- slightly elevated- defer to PCP/neprhology.   # Follow-up in 6 months/labs iron studies 2 days before; possible Feraheme.   PJ:SUNHR, Doyne Keel; nephrology.

## 2018-04-03 ENCOUNTER — Emergency Department
Admission: EM | Admit: 2018-04-03 | Discharge: 2018-04-03 | Disposition: A | Discharge status: Home / Self Care | Payer: Medicare Other | Attending: Emergency Medicine | Admitting: Emergency Medicine

## 2018-04-03 ENCOUNTER — Other Ambulatory Visit: Payer: Self-pay

## 2018-04-03 ENCOUNTER — Emergency Department: Payer: Medicare Other

## 2018-04-03 DIAGNOSIS — E1122 Type 2 diabetes mellitus with diabetic chronic kidney disease: Secondary | ICD-10-CM | POA: Diagnosis not present

## 2018-04-03 DIAGNOSIS — F17210 Nicotine dependence, cigarettes, uncomplicated: Secondary | ICD-10-CM | POA: Diagnosis not present

## 2018-04-03 DIAGNOSIS — I130 Hypertensive heart and chronic kidney disease with heart failure and stage 1 through stage 4 chronic kidney disease, or unspecified chronic kidney disease: Secondary | ICD-10-CM | POA: Diagnosis not present

## 2018-04-03 DIAGNOSIS — Z79899 Other long term (current) drug therapy: Secondary | ICD-10-CM | POA: Diagnosis not present

## 2018-04-03 DIAGNOSIS — N184 Chronic kidney disease, stage 4 (severe): Secondary | ICD-10-CM | POA: Insufficient documentation

## 2018-04-03 DIAGNOSIS — Z794 Long term (current) use of insulin: Secondary | ICD-10-CM | POA: Diagnosis not present

## 2018-04-03 DIAGNOSIS — I2510 Atherosclerotic heart disease of native coronary artery without angina pectoris: Secondary | ICD-10-CM | POA: Insufficient documentation

## 2018-04-03 DIAGNOSIS — R42 Dizziness and giddiness: Secondary | ICD-10-CM | POA: Insufficient documentation

## 2018-04-03 DIAGNOSIS — I509 Heart failure, unspecified: Secondary | ICD-10-CM | POA: Diagnosis not present

## 2018-04-03 DIAGNOSIS — Z7982 Long term (current) use of aspirin: Secondary | ICD-10-CM | POA: Diagnosis not present

## 2018-04-03 LAB — CBC
HEMATOCRIT: 38.4 % (ref 35.0–47.0)
Hemoglobin: 12.6 g/dL (ref 12.0–16.0)
MCH: 29.7 pg (ref 26.0–34.0)
MCHC: 32.8 g/dL (ref 32.0–36.0)
MCV: 90.7 fL (ref 80.0–100.0)
Platelets: 286 10*3/uL (ref 150–440)
RBC: 4.24 MIL/uL (ref 3.80–5.20)
RDW: 16 % — AB (ref 11.5–14.5)
WBC: 7.4 10*3/uL (ref 3.6–11.0)

## 2018-04-03 LAB — GLUCOSE, CAPILLARY: Glucose-Capillary: 129 mg/dL — ABNORMAL HIGH (ref 65–99)

## 2018-04-03 LAB — BASIC METABOLIC PANEL
Anion gap: 9 (ref 5–15)
BUN: 36 mg/dL — ABNORMAL HIGH (ref 6–20)
CALCIUM: 9.2 mg/dL (ref 8.9–10.3)
CO2: 22 mmol/L (ref 22–32)
Chloride: 107 mmol/L (ref 101–111)
Creatinine, Ser: 2.11 mg/dL — ABNORMAL HIGH (ref 0.44–1.00)
GFR calc Af Amer: 27 mL/min — ABNORMAL LOW (ref >60)
GFR, EST NON AFRICAN AMERICAN: 23 mL/min — AB (ref >60)
GLUCOSE: 142 mg/dL — AB (ref 65–99)
POTASSIUM: 4.5 mmol/L (ref 3.5–5.1)
SODIUM: 138 mmol/L (ref 135–145)

## 2018-04-03 LAB — URINALYSIS, COMPLETE (UACMP) WITH MICROSCOPIC
BILIRUBIN URINE: NEGATIVE
GLUCOSE, UA: NEGATIVE mg/dL
Hgb urine dipstick: NEGATIVE
Ketones, ur: NEGATIVE mg/dL
Leukocytes, UA: NEGATIVE
NITRITE: NEGATIVE
PH: 7 (ref 5.0–8.0)
Protein, ur: 100 mg/dL — AB
SPECIFIC GRAVITY, URINE: 1.011 (ref 1.005–1.030)

## 2018-04-03 LAB — TROPONIN I

## 2018-04-03 MED ORDER — SODIUM CHLORIDE 0.9 % IV BOLUS
500.0000 mL | Freq: Once | INTRAVENOUS | Status: AC
  Administered 2018-04-03: 500 mL via INTRAVENOUS

## 2018-04-03 MED ORDER — MECLIZINE HCL 25 MG PO TABS: 25.0000 mg | ORAL_TABLET | Freq: Three times a day (TID) | ORAL | 0 refills | Status: DC | PRN

## 2018-04-03 MED ORDER — MECLIZINE HCL 25 MG PO TABS
25.0000 mg | ORAL_TABLET | Freq: Once | ORAL | Status: AC
  Administered 2018-04-03: 25 mg via ORAL
  Filled 2018-04-03 (×2): qty 1

## 2018-04-03 NOTE — ED Notes (Signed)
Pt up to restroom with steady gait.

## 2018-04-03 NOTE — ED Triage Notes (Signed)
Pt states she was at dentist office this AM and got dizzy, states "my head was swimming." alert, oriented, ambulatory. No distress noted. Pt states "I thought it was my blood sugar." is eating peanut butter crackers prior to triage. Diabetic type 2. Took 8 units of insulin, ate breakfast and drank juice this AM. Took a glucose tablet from car. States didn't feel better. Denies hx of vertigo. Speaking in complete sentences. CBG in triage 129. States normal CBG is 150.

## 2018-04-03 NOTE — ED Notes (Signed)
Pt states she felt "swimmy headed" earlier today when at the dentists office.  Pt states she hasn't felt like this before.  Pt states that she was given magnesium last week by her PCP because her magnesium was low, but that she didn't feel like this last week.  Pt also states she had a headache yesterday.  Pt is A&Ox4, in NAD, ambulatory from triage.

## 2018-04-03 NOTE — ED Provider Notes (Signed)
[facility] Emergency Department Provider Note  ____________________________________________   First MD Initiated Contact with Patient 04/03/18 1749     (approximate)  I have reviewed the triage vital signs and the nursing notes.   HISTORY  Chief Complaint Dizziness   HPI Caitlin Bass is a 67 y.o. female history of CKD as well as CAD on aspirin who is presenting to the emergency department with lightheadedness and dizziness that started this morning and a dental appointment.  She says that she was sitting up after having her teeth cleaned when she began to feel lightheaded and nauseous.  She said that she also had a spinning sensation.  She thought that her sugar may be low so 1 of the dental assistance went to her car to get sugar tabs.  The patient took the sugar tabs and said that the symptoms persisted for approximately 1 hour.  She says that she still feels some degree of a spinning sensation even when she is still.  She denies any pressure or ringing in her ears.  Denies any weakness or numbness.  Says that she has an unsteady gait at her baseline which is not worsened throughout the day.  No history of vertigo.  Said that she did piece of bacon and toast prior to her dental appointment this morning but otherwise has not eaten anything with peanut butter crackers in triage throughout the day.   Past Medical History:  Diagnosis Date  . Anemia   . Anxiety   . Arthritis   . CKD (chronic kidney disease), stage IV ([place])   . Coronary artery disease    a. 03/2014 NSTEMI/Cath: LM nl, LAD 56m, 60d, D1 60, D2 nl, d3 40, LCX 100 w/ L->L collats, RCA non dom, min irregs, EF 55%-->Med Rx.  . DDD (degenerative disc disease)    a. s/p upper back surgery spring 2015.  . Diabetes mellitus without complication ([place])   . Diverticulosis   . Fibromyalgia   . GERD (gastroesophageal reflux disease)   . Hiatal hernia   . Hyperlipidemia   . Hypertensive heart disease      a. 03/2015 Echo: 55-60%, mild conc LVH.  Marland Kitchen Syncope and collapse    a. Early 03/2014->did not seek medical attn.  . Tobacco abuse     Patient Active Problem List   Diagnosis Date Noted  . Anemia due to stage 4 chronic kidney disease ([place]) 02/22/2017  . Other iron deficiency anemia 02/26/2016  . Hypertensive heart disease   . Tobacco abuse   . Hyperlipidemia   . Coronary artery disease   . Diabetes mellitus without complication ([place])   . Hypertension   . CKD (chronic kidney disease), stage IV ([place])   . GERD (gastroesophageal reflux disease)   . DDD (degenerative disc disease)     Past Surgical History:  Procedure Laterality Date  . BREAST CYST ASPIRATION Right 2005   NEG  . CARDIAC CATHETERIZATION  06/2005   [facility]  . CARDIAC CATHETERIZATION  04/2014   [facility]  . COLONOSCOPY WITH PROPOFOL N/A 04/27/2016   Procedure: COLONOSCOPY WITH PROPOFOL;  Surgeon: Manya Silvas, MD;  Location: [facility] ENDOSCOPY;  Service: Endoscopy;  Laterality: N/A;  . CORONARY ANGIOPLASTY    . DILATATION & CURETTAGE/HYSTEROSCOPY WITH MYOSURE N/A 09/18/2015   Procedure: DILATATION & CURETTAGE/HYSTEROSCOPY WITH MYOSURE;  Surgeon: Boykin Nearing, MD;  Location: [facility] ORS;  Service: Gynecology;  Laterality: N/A;  . ESOPHAGOGASTRODUODENOSCOPY (EGD) WITH PROPOFOL N/A 04/27/2016   Procedure: ESOPHAGOGASTRODUODENOSCOPY (  EGD) WITH PROPOFOL;  Surgeon: Manya Silvas, MD;  Location[facility] END[facility]COPY;  Service: Endoscopy;  Laterality: N/A;  . NECK SURGERY     Degenerative Disk Disease and removal of a spinal cyst  . TUBAL LIGATION      Prior to Admission medications   Medication Sig Start Date End Date Taking? Authorizing Provider  allopurinol (ZYLOPRIM) 100 MG tablet Take 100 mg by mouth 2 (two) times daily.    [provider]  amLODipine (NORVASC) 5 MG tablet Take 1 tablet (5 mg total) by mouth daily. 10/26/15   Wellington Hampshire, MD  aspirin 81 MG chewable tablet Chew 81 mg by mouth daily.    [provider]  atorvastatin (LIPITOR) 40 MG tablet Take 40 mg by mouth daily at 6 PM.     [provider]  Calcium Carb-Cholecalciferol (CALCIUM 1000 + D PO) Take by mouth.    [provider]  cetirizine (ZYRTEC) 10 MG tablet Take 10 mg by mouth daily.    [provider]  colchicine 0.6 MG tablet Take 1 tablet twice daily for 3 days then 1 tablet daily. 08/14/15   [provider]  cromolyn (OPTICROM) 4 % ophthalmic solution 1 drop 4 (four) times daily.    [provider]  cyclobenzaprine (FLEXERIL) 5 MG tablet Take 5 mg by mouth as needed for muscle spasms. Reported on 05/17/2016    [provider]  fluticasone (FLONASE) 50 MCG/ACT nasal spray 1 spray by Each Nare route daily.    [provider]  insulin glargine (LANTUS) 100 UNIT/ML injection Inject 35 Units into the skin every morning. 44 units at bedtime.    [provider]  Insulin Lispro, Human, (HUMALOG KWIKPEN [place]) Inject 8 Units into the skin every morning. 28 units at lunch and supper    [provider]  linaclotide (LINZESS) 72 MCG capsule Take 72 mcg by mouth daily. 01/03/17 02/26/18  [provider]  lisinopril (PRINIVIL,ZESTRIL) 5 MG tablet Take 5 mg by mouth at bedtime.    [provider]  magnesium oxide (MAG-OX) 400 MG tablet Take 400 mg by mouth 2 (two) times daily.    [provider]  metoprolol tartrate (LOPRESSOR) 25 MG tablet Take 1 tablet (25 mg total) by mouth 2 (two) times daily. 07/14/16   Wellington Hampshire, MD  Omega-3 Fatty Acids (FISH OIL MAXIMUM STRENGTH) 1200 MG CAPS Take 1,000 mg by mouth 2 (two) times daily.  02/10/16   [provider]  pantoprazole (PROTONIX) 20 MG tablet  07/25/16   [provider]  traMADol (ULTRAM) 50 MG tablet Take 1 tablet (50 mg total) by mouth 2 (two) times daily. 06/04/17   Menshew, Dannielle Karvonen, PA-C  triamcinolone cream (KENALOG) 0.1 % Apply 1 application topically 2 (two)  times daily.    [provider]    Allergies Aspirin and Hydrochlorothiazide  Family History  Problem Relation Age of Onset  . Heart disease Mother   . Heart attack Mother   . Hypertension Mother   . Hyperlipidemia Mother   . Breast cancer Sister     Social History Social History   Tobacco Use  . Smoking status: Current Every Day Smoker    Packs/day: 0.25    Years: 40.00    Pack years: 10.00    Types: Cigarettes  . Smokeless tobacco: Never Used  Substance Use Topics  . Alcohol use: No  . Drug use: No    Review of Systems  Constitutional: No fever/chills Eyes: No visual changes. ENT: No sore throat. Cardiovascular: Denies chest pain. Respiratory: Denies shortness of breath. Gastrointestinal: No abdominal pain.  no vomiting.  No diarrhea.  No constipation. Genitourinary: Negative for dysuria. Musculoskeletal: Negative for back pain. Skin: Negative for rash. Neurological: Negative for headaches, focal weakness or numbness.   ____________________________________________   PHYSICAL EXAM:  VITAL SIGNS: ED Triage Vitals [04/03/18 1232]  Enc Vitals Group     BP (!) 141/90     Pulse Rate 86     Resp 18     Temp 97.7 F (36.5 C)     Temp Source Oral     SpO2 99 %     Weight 167 lb (75.8 kg)     Height 5\' 3"  (1.6 m)     Head Circumference      Peak Flow      Pain Score 0     Pain Loc      Pain Edu?      Excl. in [place]?     Constitutional: Alert and oriented. Well appearing and in no acute distress. Eyes: Conjunctivae are normal.  [place]  head: Atraumatic.  No TM abnormality bilaterally. Nose: No congestion/rhinnorhea. Mouth/Throat: Mucous membranes are moist.  Neck: No stridor.   Cardiovascular: Normal rate, regular rhythm. Grossly normal heart sounds.   Respiratory: Normal respiratory effort.  No retractions. Lungs CTAB. Gastrointestinal: Soft and nontender. No distention. No CVA tenderness. Musculoskeletal: No lower extremity tenderness nor  edema.  No joint effusions. Neurologic:  Normal speech and language. No gross focal neurologic deficits are appreciated.  No ataxia on finger-to-nose testing.  No nystagmus.  Walks with a normal gait, unassisted. Skin:  Skin is warm, dry and intact. No rash noted. Psychiatric: Mood and affect are normal. Speech and behavior are normal.  ____________________________________________   LABS (all labs ordered are listed, but only abnormal results are displayed)  Labs Reviewed  BASIC METABOLIC PANEL - Abnormal; Notable for the following components:      Result Value   Glucose, Bld 142 (*)    BUN 36 (*)    Creatinine, Ser 2.11 (*)    GFR calc non Af Amer 23 (*)    GFR calc Af Amer 27 (*)    All other components within normal limits  CBC - Abnormal; Notable for the following components:   RDW 16.0 (*)    All other components within normal limits  URINALYSIS, COMPLETE (UACMP) WITH MICROSCOPIC - Abnormal; Notable for the following components:   Color, Urine STRAW (*)    APPearance CLEAR (*)    Protein, ur 100 (*)    Bacteria, UA RARE (*)    All other components within normal limits  GLUCOSE, CAPILLARY - Abnormal; Notable for the following components:   Glucose-Capillary 129 (*)    All other components within normal limits  TROPONIN I  TROPONIN I  CBG MONITORING, ED   ____________________________________________  EKG  ED ECG REPORT I, Doran Stabler, the attending physician, personally viewed and interpreted this ECG.   Date: 04/03/2018  EKG Time: 1244  Rate: 66  Rhythm: normal sinus rhythm  Axis: Normal  Intervals:left anterior fascicular block  ST&T Change: No ST segment elevation or depression.  T wave inversions in 1 and aVL.  No significant change from previous.  ____________________________________________  RADIOLOGY  Negative CT head ____________________________________________   PROCEDURES  Procedure(s) performed:   Procedures  Critical Care performed:    ____________________________________________   INITIAL IMPRESSION /  ASSESSMENT AND PLAN / ED COURSE  Pertinent labs & imaging results that were available during my care of the patient were reviewed by me and considered in my medical decision making (see chart for details).  DDX: Electrolyte abnormality, near syncope, vertigo, CVA, dehydration, hypoglycemia As part of my medical decision making, I reviewed the following data within the [place] Notes from prior ED visits  ----------------------------------------- 8:20 PM on 04/03/2018 -----------------------------------------  Patient at this time says that she is improving without any feeling of dizziness or lightheadedness.  Has can been given meclizine as well as fluids as well as eaten.  Reassuring head CT.  Negative troponin x2.  Likely peripheral vertigo and not eating causing the prolonged symptoms.  Will be discharged with meclizine.  Patient will follow-up with primary care.  Unlikely to be stroke.  Reassuring neurologic exam and normal head CT.  Patient understanding of the diagnosis as well as treatment plan willing to comply. ____________________________________________   FINAL CLINICAL IMPRESSION(S) / ED DIAGNOSES  Vertigo.  NEW MEDICATIONS STARTED DURING THIS VISIT:  New Prescriptions   No medications on file     Note:  This document was prepared using Dragon voice recognition software and may include unintentional dictation errors.     Orbie Pyo, MD 04/03/18 2021

## 2018-05-02 ENCOUNTER — Emergency Department
Admission: EM | Admit: 2018-05-02 | Discharge: 2018-05-02 | Disposition: A | Discharge status: Home / Self Care | Payer: Medicare Other | Attending: Emergency Medicine | Admitting: Emergency Medicine

## 2018-05-02 ENCOUNTER — Other Ambulatory Visit: Payer: Self-pay

## 2018-05-02 ENCOUNTER — Encounter: Payer: Self-pay | Admitting: Emergency Medicine

## 2018-05-02 DIAGNOSIS — E1122 Type 2 diabetes mellitus with diabetic chronic kidney disease: Secondary | ICD-10-CM | POA: Insufficient documentation

## 2018-05-02 DIAGNOSIS — I2510 Atherosclerotic heart disease of native coronary artery without angina pectoris: Secondary | ICD-10-CM | POA: Insufficient documentation

## 2018-05-02 DIAGNOSIS — I129 Hypertensive chronic kidney disease with stage 1 through stage 4 chronic kidney disease, or unspecified chronic kidney disease: Secondary | ICD-10-CM | POA: Insufficient documentation

## 2018-05-02 DIAGNOSIS — F17210 Nicotine dependence, cigarettes, uncomplicated: Secondary | ICD-10-CM | POA: Insufficient documentation

## 2018-05-02 DIAGNOSIS — Z794 Long term (current) use of insulin: Secondary | ICD-10-CM | POA: Insufficient documentation

## 2018-05-02 DIAGNOSIS — Z955 Presence of coronary angioplasty implant and graft: Secondary | ICD-10-CM | POA: Insufficient documentation

## 2018-05-02 DIAGNOSIS — Z7982 Long term (current) use of aspirin: Secondary | ICD-10-CM | POA: Insufficient documentation

## 2018-05-02 DIAGNOSIS — N184 Chronic kidney disease, stage 4 (severe): Secondary | ICD-10-CM | POA: Diagnosis not present

## 2018-05-02 DIAGNOSIS — E860 Dehydration: Secondary | ICD-10-CM | POA: Diagnosis not present

## 2018-05-02 LAB — BASIC METABOLIC PANEL
ANION GAP: 9 (ref 5–15)
BUN: 38 mg/dL — ABNORMAL HIGH (ref 6–20)
CHLORIDE: 109 mmol/L (ref 101–111)
CO2: 19 mmol/L — ABNORMAL LOW (ref 22–32)
Calcium: 9.1 mg/dL (ref 8.9–10.3)
Creatinine, Ser: 2.4 mg/dL — ABNORMAL HIGH (ref 0.44–1.00)
GFR calc non Af Amer: 20 mL/min — ABNORMAL LOW (ref >60)
GFR, EST AFRICAN AMERICAN: 23 mL/min — AB (ref >60)
Glucose, Bld: 74 mg/dL (ref 65–99)
POTASSIUM: 4.4 mmol/L (ref 3.5–5.1)
Sodium: 137 mmol/L (ref 135–145)

## 2018-05-02 LAB — MAGNESIUM: MAGNESIUM: 1.6 mg/dL — AB (ref 1.7–2.4)

## 2018-05-02 LAB — CBC
HCT: 36.1 % (ref 35.0–47.0)
HEMOGLOBIN: 11.9 g/dL — AB (ref 12.0–16.0)
MCH: 29.9 pg (ref 26.0–34.0)
MCHC: 33 g/dL (ref 32.0–36.0)
MCV: 90.4 fL (ref 80.0–100.0)
Platelets: 314 10*3/uL (ref 150–440)
RBC: 3.99 MIL/uL (ref 3.80–5.20)
RDW: 16.3 % — ABNORMAL HIGH (ref 11.5–14.5)
WBC: 5.6 10*3/uL (ref 3.6–11.0)

## 2018-05-02 LAB — GLUCOSE, CAPILLARY: GLUCOSE-CAPILLARY: 51 mg/dL — AB (ref 65–99)

## 2018-05-02 MED ORDER — MAGNESIUM SULFATE 2 GM/50ML IV SOLN
2.0000 g | Freq: Once | INTRAVENOUS | Status: AC
  Administered 2018-05-02: 2 g via INTRAVENOUS
  Filled 2018-05-02: qty 50

## 2018-05-02 MED ORDER — SODIUM CHLORIDE 0.9 % IV BOLUS
1000.0000 mL | Freq: Once | INTRAVENOUS | Status: AC
  Administered 2018-05-02: 1000 mL via INTRAVENOUS

## 2018-05-02 NOTE — ED Provider Notes (Signed)
[facility] Emergency Department Provider Note  ____________________________________________   I have reviewed the triage vital signs and the nursing notes.   HISTORY  Chief Complaint Abnormal lab  History limited by: Not Limited   HPI Caitlin Bass is a 67 y.o. female who presents to the emergency department today at the request of her primary care physician because of [place] findings of low magnesium.  Patient states she has had muscle cramping.  This has been going on for the past few days.  She describes being located primarily in her extremities.  This is a new issue for the patient.  She denies any chest pain or shortness of breath.  She denies any fevers nausea or vomiting.  She went to primary care doctor's office where she was found to have magnesium 1.4.   Per medical record review patient has a history of CKD  Past Medical History:  Diagnosis Date  . Anemia   . Anxiety   . Arthritis   . CKD (chronic kidney disease), stage IV ([place])   . Coronary artery disease    a. 03/2014 NSTEMI/Cath: LM nl, LAD 32m, 60d, D1 60, D2 nl, d3 40, LCX 100 w/ L->L collats, RCA non dom, min irregs, EF 55%-->Med Rx.  . DDD (degenerative disc disease)    a. s/p upper back surgery spring 2015.  . Diabetes mellitus without complication ([place])   . Diverticulosis   . Fibromyalgia   . GERD (gastroesophageal reflux disease)   . Hiatal hernia   . Hyperlipidemia   . Hypertensive heart disease    a. 03/2015 Echo: 55-60%, mild conc LVH.  Marland Kitchen Syncope and collapse    a. Early 03/2014->did not seek medical attn.  . Tobacco abuse     Patient Active Problem List   Diagnosis Date Noted  . Anemia due to stage 4 chronic kidney disease ([place]) 02/22/2017  . Other iron deficiency anemia 02/26/2016  . Hypertensive heart disease   . Tobacco abuse   . Hyperlipidemia   . Coronary artery disease   . Diabetes mellitus without complication ([place])   . Hypertension   . CKD (chronic  kidney disease), stage IV ([place])   . GERD (gastroesophageal reflux disease)   . DDD (degenerative disc disease)     Past Surgical History:  Procedure Laterality Date  . BREAST CYST ASPIRATION Right 2005   NEG  . CARDIAC CATHETERIZATION  06/2005   [facility]  . CARDIAC CATHETERIZATION  04/2014   [facility]  . COLONOSCOPY WITH PROPOFOL N/A 04/27/2016   Procedure: COLONOSCOPY WITH PROPOFOL;  Surgeon: Manya Silvas, MD;  Location: [facility] ENDOSCOPY;  Service: Endoscopy;  Laterality: N/A;  . CORONARY ANGIOPLASTY    . DILATATION & CURETTAGE/HYSTEROSCOPY WITH MYOSURE N/A 09/18/2015   Procedure: DILATATION & CURETTAGE/HYSTEROSCOPY WITH MYOSURE;  Surgeon: Boykin Nearing, MD;  Location: [facility] ORS;  Service: Gynecology;  Laterality: N/A;  . ESOPHAGOGASTRODUODENOSCOPY (EGD) WITH PROPOFOL N/A 04/27/2016   Procedure: ESOPHAGOGASTRODUODENOSCOPY (EGD) WITH PROPOFOL;  Surgeon: Manya Silvas, MD;  Location: [facility] ENDOSCOPY;  Service: Endoscopy;  Laterality: N/A;  . NECK SURGERY     Degenerative Disk Disease and removal of a spinal cyst  . TUBAL LIGATION      Prior to Admission medications   Medication Sig Start Date End Date Taking? Authorizing Provider  allopurinol (ZYLOPRIM) 100 MG tablet Take 100 mg by mouth 2 (two) times daily.    [provider]  amLODipine (NORVASC) 5 MG tablet Take 1 tablet (5 mg total)  by mouth daily. 10/26/15   Wellington Hampshire, MD  aspirin 81 MG chewable tablet Chew 81 mg by mouth daily.    [provider]  atorvastatin (LIPITOR) 40 MG tablet Take 40 mg by mouth daily at 6 PM.     [provider]  Calcium Carb-Cholecalciferol (CALCIUM 1000 + D PO) Take by mouth.    [provider]  cetirizine (ZYRTEC) 10 MG tablet Take 10 mg by mouth daily.    [provider]  colchicine 0.6 MG tablet Take 1 tablet twice daily for 3 days then 1 tablet daily. 08/14/15   [provider]  cromolyn (OPTICROM) 4 % ophthalmic solution 1 drop 4 (four)  times daily.    [provider]  cyclobenzaprine (FLEXERIL) 5 MG tablet Take 5 mg by mouth as needed for muscle spasms. Reported on 05/17/2016    [provider]  fluticasone (FLONASE) 50 MCG/ACT nasal spray 1 spray by Each Nare route daily.    [provider]  insulin glargine (LANTUS) 100 UNIT/ML injection Inject 35 Units into the skin every morning. 44 units at bedtime.    [provider]  Insulin Lispro, Human, (HUMALOG KWIKPEN [place]) Inject 8 Units into the skin every morning. 28 units at lunch and supper    [provider]  linaclotide (LINZESS) 72 MCG capsule Take 72 mcg by mouth daily. 01/03/17 02/26/18  [provider]  lisinopril (PRINIVIL,ZESTRIL) 5 MG tablet Take 5 mg by mouth at bedtime.    [provider]  magnesium oxide (MAG-OX) 400 MG tablet Take 400 mg by mouth 2 (two) times daily.    [provider]  meclizine (ANTIVERT) 25 MG tablet Take 1 tablet (25 mg total) by mouth 3 (three) times daily as needed for dizziness. 04/03/18   Schaevitz, Randall An, MD  metoprolol tartrate (LOPRESSOR) 25 MG tablet Take 1 tablet (25 mg total) by mouth 2 (two) times daily. 07/14/16   Wellington Hampshire, MD  Omega-3 Fatty Acids (FISH OIL MAXIMUM STRENGTH) 1200 MG CAPS Take 1,000 mg by mouth 2 (two) times daily.  02/10/16   [provider]  pantoprazole (PROTONIX) 20 MG tablet  07/25/16   [provider]  traMADol (ULTRAM) 50 MG tablet Take 1 tablet (50 mg total) by mouth 2 (two) times daily. 06/04/17   Menshew, Dannielle Karvonen, PA-C  triamcinolone cream (KENALOG) 0.1 % Apply 1 application topically 2 (two) times daily.    [provider]    Allergies Aspirin and Hydrochlorothiazide  Family History  Problem Relation Age of Onset  . Heart disease Mother   . Heart attack Mother   . Hypertension Mother   . Hyperlipidemia Mother   . Breast cancer Sister     Social History Social History   Tobacco Use  .  Smoking status: Current Every Day Smoker    Packs/day: 0.25    Years: 40.00    Pack years: 10.00    Types: Cigarettes  . Smokeless tobacco: Never Used  Substance Use Topics  . Alcohol use: No  . Drug use: No    Review of Systems Constitutional: No fever/chills Eyes: No visual changes. ENT: No sore throat. Cardiovascular: Denies chest pain. Respiratory: Denies shortness of breath. Gastrointestinal: No abdominal pain.  No nausea, no vomiting.  No diarrhea.   Genitourinary: Negative for dysuria. Musculoskeletal: Positive for muscle cramping. Skin: Negative for rash. Neurological: Negative for headaches, focal weakness or numbness.  ____________________________________________   PHYSICAL EXAM:  VITAL SIGNS:  ED Triage Vitals  Enc Vitals Group     BP 05/02/18 1634 138/65     Pulse Rate 05/02/18 1634 82     Resp 05/02/18 1634 16     Temp 05/02/18 1634 98.4 F (36.9 C)     Temp Source 05/02/18 1634 Oral     SpO2 05/02/18 1634 96 %     Weight 05/02/18 1635 160 lb (72.6 kg)     Height 05/02/18 1635 5\' 3"  (1.6 m)     Head Circumference --      Peak Flow --      Pain Score 05/02/18 1635 0   Constitutional: Alert and oriented.  Eyes: Conjunctivae are normal.  ENT      Head: Normocephalic and atraumatic.      Nose: No congestion/rhinnorhea.      Mouth/Throat: Mucous membranes are moist.      Neck: No stridor. Hematological/Lymphatic/Immunilogical: No cervical lymphadenopathy. Cardiovascular: Normal rate, regular rhythm.  No murmurs, rubs, or gallops. Respiratory: Normal respiratory effort without tachypnea nor retractions. Breath sounds are clear and equal bilaterally. No wheezes/rales/rhonchi. Gastrointestinal: Soft and non tender. No rebound. No guarding.  Genitourinary: Deferred Musculoskeletal: Normal range of motion in all extremities. No lower extremity edema. Neurologic:  Normal speech and language. No gross focal neurologic deficits are appreciated.  Skin:  Skin is  warm, dry and intact. No rash noted. Psychiatric: Mood and affect are normal. Speech and behavior are normal. Patient exhibits appropriate insight and judgment.  ____________________________________________    LABS (pertinent positives/negatives)  Magnesium 1.6 CBC wbc 5.6, hgb 11.9, plt 314 BMP na 137, k 4.4, cr 2.40  ____________________________________________   EKG  None  ____________________________________________    RADIOLOGY  None  ____________________________________________   PROCEDURES  Procedures  ____________________________________________   INITIAL IMPRESSION / ASSESSMENT AND PLAN / ED COURSE  Pertinent labs & imaging results that were available during my care of the patient were reviewed by me and considered in my medical decision making (see chart for details).   Patient presented to the emergency department today because of concerns for low magnesium found on outpatient lab work.  Magnesium here 1.6.  Patient was given IV fluids and magnesium.  She did feel better.  Discussed importance of continued follow-up with primary care physician.  ____________________________________________   FINAL CLINICAL IMPRESSION(S) / ED DIAGNOSES  Final diagnoses:  Hypomagnesemia  Dehydration     Note: This dictation was prepared with Dragon dictation. Any transcriptional errors that result from this process are unintentional     Nance Pear, MD 05/02/18 2008

## 2018-05-02 NOTE — ED Notes (Signed)
Pt given ginger ale and peanut butter crackers.

## 2018-05-02 NOTE — ED Triage Notes (Addendum)
Pt to ED via POV, states was called by PCP for low mag, 1.4. Pt c/o muscle cramps to lower extremeties intermit. PT ambulatory , VSS

## 2018-05-02 NOTE — Discharge Instructions (Signed)
Please seek medical attention for any high fevers, chest pain, shortness of breath, change in behavior, persistent vomiting, bloody stool or any other new or concerning symptoms.  

## 2018-08-09 ENCOUNTER — Other Ambulatory Visit: Payer: Self-pay | Admitting: Nurse Practitioner

## 2018-08-09 DIAGNOSIS — Z1231 Encounter for screening mammogram for malignant neoplasm of breast: Secondary | ICD-10-CM

## 2018-08-28 ENCOUNTER — Inpatient Hospital Stay: Payer: Medicare Other | Attending: Internal Medicine

## 2018-08-28 DIAGNOSIS — N184 Chronic kidney disease, stage 4 (severe): Secondary | ICD-10-CM | POA: Insufficient documentation

## 2018-08-28 DIAGNOSIS — I129 Hypertensive chronic kidney disease with stage 1 through stage 4 chronic kidney disease, or unspecified chronic kidney disease: Secondary | ICD-10-CM | POA: Insufficient documentation

## 2018-08-28 DIAGNOSIS — D631 Anemia in chronic kidney disease: Secondary | ICD-10-CM | POA: Diagnosis present

## 2018-08-28 DIAGNOSIS — D509 Iron deficiency anemia, unspecified: Secondary | ICD-10-CM | POA: Insufficient documentation

## 2018-08-28 LAB — CBC WITH DIFFERENTIAL/PLATELET
Basophils Absolute: 0.1 10*3/uL (ref 0–0.1)
Basophils Relative: 1 %
EOS PCT: 2 %
Eosinophils Absolute: 0.1 10*3/uL (ref 0–0.7)
HEMATOCRIT: 34.4 % — AB (ref 35.0–47.0)
HEMOGLOBIN: 11.2 g/dL — AB (ref 12.0–16.0)
LYMPHS ABS: 2.1 10*3/uL (ref 1.0–3.6)
LYMPHS PCT: 36 %
MCH: 29.2 pg (ref 26.0–34.0)
MCHC: 32.5 g/dL (ref 32.0–36.0)
MCV: 89.9 fL (ref 80.0–100.0)
MONO ABS: 0.5 10*3/uL (ref 0.2–0.9)
Monocytes Relative: 9 %
NEUTROS ABS: 3 10*3/uL (ref 1.4–6.5)
NEUTROS PCT: 52 %
PLATELETS: 298 10*3/uL (ref 150–440)
RBC: 3.82 MIL/uL (ref 3.80–5.20)
RDW: 16.5 % — ABNORMAL HIGH (ref 11.5–14.5)
WBC: 5.9 10*3/uL (ref 3.6–11.0)

## 2018-08-28 LAB — IRON AND TIBC
Iron: 63 ug/dL (ref 28–170)
SATURATION RATIOS: 24 % (ref 10.4–31.8)
TIBC: 262 ug/dL (ref 250–450)
UIBC: 199 ug/dL

## 2018-08-28 LAB — FERRITIN: Ferritin: 309 ng/mL — ABNORMAL HIGH (ref 11–307)

## 2018-08-31 ENCOUNTER — Encounter: Payer: Self-pay | Admitting: Internal Medicine

## 2018-08-31 ENCOUNTER — Inpatient Hospital Stay (HOSPITAL_BASED_OUTPATIENT_CLINIC_OR_DEPARTMENT_OTHER): Payer: Medicare Other | Admitting: Internal Medicine

## 2018-08-31 ENCOUNTER — Inpatient Hospital Stay: Payer: Medicare Other

## 2018-08-31 VITALS — BP 139/74 | HR 81 | Temp 98.1°F | Resp 16 | Wt 172.4 lb

## 2018-08-31 DIAGNOSIS — D631 Anemia in chronic kidney disease: Secondary | ICD-10-CM

## 2018-08-31 DIAGNOSIS — D509 Iron deficiency anemia, unspecified: Secondary | ICD-10-CM | POA: Diagnosis not present

## 2018-08-31 DIAGNOSIS — D508 Other iron deficiency anemias: Secondary | ICD-10-CM

## 2018-08-31 DIAGNOSIS — I129 Hypertensive chronic kidney disease with stage 1 through stage 4 chronic kidney disease, or unspecified chronic kidney disease: Secondary | ICD-10-CM

## 2018-08-31 DIAGNOSIS — N184 Chronic kidney disease, stage 4 (severe): Secondary | ICD-10-CM

## 2018-08-31 NOTE — Assessment & Plan Note (Addendum)
#    Anemia Iron deficiency- hemoglobin today 11.2; patient fairly asymptomatic. saturation 24%; ferritin 324. Continue PO iron once a day. NO IV iron today. STABLE.   # CKD-stage IV creatinine-2.4; stable Dr.Swain.   #Hypertension stable.  # Smoking/ trying to quit- Discussed with the patient regarding the ill effects of smoking- including but not limited to cardiac lung and vascular diseases and malignancies. Counseled against smoking; patient-not interested in quitting.  lCSP; interested.  #Hypomagnesemia magnesium recently 1.6.;  Stable.  # Follow-up in 6 months/labs iron studies 2 days before; possible Feraheme.   QQ:VZDGLO Caitlin Bass; ; Caitlin Bass/Caitlin Bass

## 2018-08-31 NOTE — Progress Notes (Signed)
[facility] OFFICE PROGRESS NOTE  Patient Care Team: Elisabeth Cara, NP as PCP - General (Nurse Practitioner)   SUMMARY OF ONCOLOGIC HISTORY:  # March 2017- Anemia- Hb 8; plan Iv iron s/p IV iron x2 [[april 2017]; [place] May 2017.   # CKD [? nephrologist as per pt; ? Dr.Swain]/ CAD  # AUG 2016- ELEVATED K/L ratio 1.99; SPEP- Neg.    INTERVAL HISTORY:  67 year old pleasant female patient with above history of anemia secondary to iron deficiency/CKD.  Denies any nausea vomiting.  Denies any chest pain or cough.  No blood in stools black or stools.  No fatigue.  Review of Systems  Constitutional: Negative for chills, diaphoresis, fever, malaise/fatigue and weight loss.  HENT: Negative for nosebleeds and sore throat.   Eyes: Negative for double vision.  Respiratory: Negative for cough, hemoptysis, sputum production, shortness of breath and wheezing.   Cardiovascular: Negative for chest pain, palpitations, orthopnea and leg swelling.  Gastrointestinal: Negative for abdominal pain, blood in stool, constipation, diarrhea, heartburn, melena, nausea and vomiting.  Genitourinary: Negative for dysuria, frequency and urgency.  Musculoskeletal: Negative for back pain and joint pain.  Skin: Negative.  Negative for itching and rash.  Neurological: Negative for dizziness, tingling, focal weakness, weakness and headaches.  Endo/Heme/Allergies: Does not bruise/bleed easily.  Psychiatric/Behavioral: Negative for depression. The patient is not nervous/anxious and does not have insomnia.      PAST MEDICAL HISTORY :  Past Medical History:  Diagnosis Date  . Anemia   . Anxiety   . Arthritis   . CKD (chronic kidney disease), stage IV ([place])   . Coronary artery disease    a. 03/2014 NSTEMI/Cath: LM nl, LAD 53m, 60d, D1 60, D2 nl, d3 40, LCX 100 w/ L->L collats, RCA non dom, min irregs, EF 55%-->Med Rx.  . DDD (degenerative disc disease)    a. s/p upper back surgery spring  2015.  . Diabetes mellitus without complication ([place])   . Diverticulosis   . Fibromyalgia   . GERD (gastroesophageal reflux disease)   . Hiatal hernia   . Hyperlipidemia   . Hypertensive heart disease    a. 03/2015 Echo: 55-60%, mild conc LVH.  Marland Kitchen Syncope and collapse    a. Early 03/2014->did not seek medical attn.  . Tobacco abuse     PAST SURGICAL HISTORY :   Past Surgical History:  Procedure Laterality Date  . BREAST CYST ASPIRATION Right 2005   NEG  . CARDIAC CATHETERIZATION  06/2005   [facility]  . CARDIAC CATHETERIZATION  04/2014   [facility]  . COLONOSCOPY WITH PROPOFOL N/A 04/27/2016   Procedure: COLONOSCOPY WITH PROPOFOL;  Surgeon: Manya Silvas, MD;  Location: [facility] ENDOSCOPY;  Service: Endoscopy;  Laterality: N/A;  . CORONARY ANGIOPLASTY    . DILATATION & CURETTAGE/HYSTEROSCOPY WITH MYOSURE N/A 09/18/2015   Procedure: DILATATION & CURETTAGE/HYSTEROSCOPY WITH MYOSURE;  Surgeon: Boykin Nearing, MD;  Location: [facility] ORS;  Service: Gynecology;  Laterality: N/A;  . ESOPHAGOGASTRODUODENOSCOPY (EGD) WITH PROPOFOL N/A 04/27/2016   Procedure: ESOPHAGOGASTRODUODENOSCOPY (EGD) WITH PROPOFOL;  Surgeon: Manya Silvas, MD;  Location: [facility] ENDOSCOPY;  Service: Endoscopy;  Laterality: N/A;  . NECK SURGERY     Degenerative Disk Disease and removal of a spinal cyst  . TUBAL LIGATION      FAMILY HISTORY :   Family History  Problem Relation Age of Onset  . Heart disease Mother   . Heart attack Mother   . Hypertension Mother   . Hyperlipidemia Mother   .  Breast cancer Sister     SOCIAL HISTORY:   Social History   Tobacco Use  . Smoking status: Current Every Day Smoker    Packs/day: 0.25    Years: 40.00    Pack years: 10.00    Types: Cigarettes  . Smokeless tobacco: Never Used  Substance Use Topics  . Alcohol use: No  . Drug use: No    ALLERGIES:  is allergic to aspirin and hydrochlorothiazide.  MEDICATIONS:  Current Outpatient Medications  Medication Sig Dispense Refill   . allopurinol (ZYLOPRIM) 100 MG tablet Take 100 mg by mouth 2 (two) times daily.    Marland Kitchen amLODipine (NORVASC) 5 MG tablet Take 1 tablet (5 mg total) by mouth daily. 30 tablet 3  . aspirin 81 MG chewable tablet Chew 81 mg by mouth daily.    Marland Kitchen atorvastatin (LIPITOR) 40 MG tablet Take 40 mg by mouth daily at 6 PM.     . Calcium Carb-Cholecalciferol (CALCIUM 1000 + D PO) Take by mouth.    . cetirizine (ZYRTEC) 10 MG tablet Take 10 mg by mouth daily.    . colchicine 0.6 MG tablet Take 1 tablet twice daily for 3 days then 1 tablet daily.    . cromolyn (OPTICROM) 4 % ophthalmic solution 1 drop 4 (four) times daily.    . cyclobenzaprine (FLEXERIL) 5 MG tablet Take 5 mg by mouth as needed for muscle spasms. Reported on 05/17/2016    . fluticasone (FLONASE) 50 MCG/ACT nasal spray 1 spray by Each Nare route daily.    . insulin glargine (LANTUS) 100 UNIT/ML injection Inject 35 Units into the skin every morning. 44 units at bedtime.    . Insulin Lispro, Human, (HUMALOG KWIKPEN [place]) Inject 8 Units into the skin every morning. 28 units at lunch and supper    . lisinopril (PRINIVIL,ZESTRIL) 5 MG tablet Take 5 mg by mouth at bedtime.    . magnesium oxide (MAG-OX) 400 MG tablet Take 400 mg by mouth 2 (two) times daily.    . meclizine (ANTIVERT) 25 MG tablet Take 1 tablet (25 mg total) by mouth 3 (three) times daily as needed for dizziness. 30 tablet 0  . metoprolol tartrate (LOPRESSOR) 25 MG tablet Take 1 tablet (25 mg total) by mouth 2 (two) times daily. 60 tablet 3  . Omega-3 Fatty Acids (FISH OIL MAXIMUM STRENGTH) 1200 MG CAPS Take 1,000 mg by mouth 2 (two) times daily.   0  . pantoprazole (PROTONIX) 20 MG tablet   0  . traMADol (ULTRAM) 50 MG tablet Take 1 tablet (50 mg total) by mouth 2 (two) times daily. 10 tablet 0  . triamcinolone cream (KENALOG) 0.1 % Apply 1 application topically 2 (two) times daily.    Marland Kitchen linaclotide (LINZESS) 72 MCG capsule Take 72 mcg by mouth daily.     No current facility-administered  medications for this visit.     PHYSICAL EXAMINATION:   BP 139/74 (BP Location: Left Arm, Patient Position: Sitting)   Pulse 81   Temp 98.1 F (36.7 C) (Tympanic)   Resp 16   Wt 172 lb 6.4 oz (78.2 kg)   BMI 30.54 kg/m   Filed Weights   08/31/18 1343 08/31/18 1344  Weight: 172 lb 12.8 oz (78.4 kg) 172 lb 6.4 oz (78.2 kg)    Physical Exam  Constitutional: She is oriented to person, place, and time and well-developed, well-nourished, and in no distress.  HENT:  Head: Normocephalic and atraumatic.  Mouth/Throat: Oropharynx is clear and moist. No  oropharyngeal exudate.  Eyes: Pupils are equal, round, and reactive to light.  Neck: Normal range of motion. Neck supple.  Cardiovascular: Normal rate and regular rhythm.  Pulmonary/Chest: No respiratory distress. She has no wheezes.  Abdominal: Soft. Bowel sounds are normal. She exhibits no distension and no mass. There is no tenderness. There is no rebound and no guarding.  Musculoskeletal: Normal range of motion. She exhibits no edema or tenderness.  Neurological: She is alert and oriented to person, place, and time.  Skin: Skin is warm.  Psychiatric: Affect normal.    LABORATORY DATA:  I have reviewed the data as listed    Component Value Date/Time   NA 137 05/02/2018 1640   NA 139 10/21/2014 1332   K 4.4 05/02/2018 1640   K 4.4 10/21/2014 1332   CL 109 05/02/2018 1640   CL 108 (H) 10/21/2014 1332   CO2 19 (L) 05/02/2018 1640   CO2 24 10/21/2014 1332   GLUCOSE 74 05/02/2018 1640   GLUCOSE 103 (H) 10/21/2014 1332   BUN 38 (H) 05/02/2018 1640   BUN 21 (H) 10/21/2014 1332   CREATININE 2.40 (H) 05/02/2018 1640   CREATININE 1.45 (H) 10/21/2014 1332   CALCIUM 9.1 05/02/2018 1640   CALCIUM 9.1 10/21/2014 1332   PROT 7.5 08/25/2017 1358   PROT 7.5 10/21/2014 1332   ALBUMIN 3.9 08/25/2017 1358   ALBUMIN 3.6 10/21/2014 1332   AST 26 08/25/2017 1358   AST 15 10/21/2014 1332   ALT 28 08/25/2017 1358   ALT 27 10/21/2014 1332    ALKPHOS 95 08/25/2017 1358   ALKPHOS 94 10/21/2014 1332   BILITOT 0.4 08/25/2017 1358   BILITOT 0.2 10/21/2014 1332   GFRNONAA 20 (L) 05/02/2018 1640   GFRNONAA 39 (L) 10/21/2014 1332   GFRNONAA 27 (L) 05/10/2014 0031   GFRAA 23 (L) 05/02/2018 1640   GFRAA 47 (L) 10/21/2014 1332   GFRAA 32 (L) 05/10/2014 0031    No results found for: SPEP, UPEP  Lab Results  Component Value Date   WBC 5.9 08/28/2018   NEUTROABS 3.0 08/28/2018   HGB 11.2 (L) 08/28/2018   HCT 34.4 (L) 08/28/2018   MCV 89.9 08/28/2018   PLT 298 08/28/2018      Chemistry      Component Value Date/Time   NA 137 05/02/2018 1640   NA 139 10/21/2014 1332   K 4.4 05/02/2018 1640   K 4.4 10/21/2014 1332   CL 109 05/02/2018 1640   CL 108 (H) 10/21/2014 1332   CO2 19 (L) 05/02/2018 1640   CO2 24 10/21/2014 1332   BUN 38 (H) 05/02/2018 1640   BUN 21 (H) 10/21/2014 1332   CREATININE 2.40 (H) 05/02/2018 1640   CREATININE 1.45 (H) 10/21/2014 1332      Component Value Date/Time   CALCIUM 9.1 05/02/2018 1640   CALCIUM 9.1 10/21/2014 1332   ALKPHOS 95 08/25/2017 1358   ALKPHOS 94 10/21/2014 1332   AST 26 08/25/2017 1358   AST 15 10/21/2014 1332   ALT 28 08/25/2017 1358   ALT 27 10/21/2014 1332   BILITOT 0.4 08/25/2017 1358   BILITOT 0.2 10/21/2014 1332       ASSESSMENT & PLAN:   Anemia due to stage 4 chronic kidney disease ([place]) #  Anemia Iron deficiency- hemoglobin today 11.2; patient fairly asymptomatic. saturation 24%; ferritin 324. Continue PO iron once a day. NO IV iron today. STABLE.   # CKD-stage IV creatinine-2.4; stable Dr.Swain.   #Hypertension stable.  # Smoking/  trying to quit- Discussed with the patient regarding the ill effects of smoking- including but not limited to cardiac lung and vascular diseases and malignancies. Counseled against smoking; patient-not interested in quitting.  lCSP; interested.  #Hypomagnesemia magnesium recently 1.6.;  Stable.  # Follow-up in 6 months/labs  iron studies 2 days before; possible Feraheme.   UY:EBXIDH [place]; ; [facility]      Cammie Sickle, MD 09/02/2018 4:40 PM

## 2018-09-05 ENCOUNTER — Telehealth: Payer: Self-pay | Admitting: *Deleted

## 2018-09-05 DIAGNOSIS — Z87891 Personal history of nicotine dependence: Secondary | ICD-10-CM

## 2018-09-05 DIAGNOSIS — Z122 Encounter for screening for malignant neoplasm of respiratory organs: Secondary | ICD-10-CM

## 2018-09-05 NOTE — Telephone Encounter (Signed)
Received referral for initial lung cancer screening scan. Contacted patient and obtained smoking history,(current, 30 pack year) as well as answering questions related to screening process. Patient denies signs of lung cancer such as weight loss or hemoptysis. Patient denies comorbidity that would prevent curative treatment if lung cancer were found. Patient is scheduled for shared decision making visit and CT scan on 09/27/18 at 1:30pm.

## 2018-09-27 ENCOUNTER — Encounter: Payer: Self-pay | Admitting: Nurse Practitioner

## 2018-09-27 ENCOUNTER — Ambulatory Visit
Admission: RE | Admit: 2018-09-27 | Discharge: 2018-09-27 | Disposition: A | Discharge status: Home / Self Care | Payer: Medicare Other | Source: Ambulatory Visit | Attending: Nurse Practitioner | Admitting: Nurse Practitioner

## 2018-09-27 ENCOUNTER — Inpatient Hospital Stay (HOSPITAL_BASED_OUTPATIENT_CLINIC_OR_DEPARTMENT_OTHER): Payer: Medicare Other | Admitting: Nurse Practitioner

## 2018-09-27 DIAGNOSIS — Z87891 Personal history of nicotine dependence: Secondary | ICD-10-CM | POA: Diagnosis not present

## 2018-09-27 DIAGNOSIS — Z1231 Encounter for screening mammogram for malignant neoplasm of breast: Secondary | ICD-10-CM | POA: Diagnosis not present

## 2018-09-27 DIAGNOSIS — Z122 Encounter for screening for malignant neoplasm of respiratory organs: Secondary | ICD-10-CM

## 2018-09-27 NOTE — Progress Notes (Signed)
In accordance with CMS guidelines, patient has met eligibility criteria including age, absence of signs or symptoms of lung cancer.  Social History   Tobacco Use  . Smoking status: Current Every Day Smoker    Packs/day: 0.75    Years: 40.00    Pack years: 30.00    Types: Cigarettes  . Smokeless tobacco: Never Used  Substance Use Topics  . Alcohol use: No  . Drug use: No      A shared decision-making session was conducted prior to the performance of CT scan. This includes one or more decision aids, includes benefits and harms of screening, follow-up diagnostic testing, over-diagnosis, false positive rate, and total radiation exposure.   Counseling on the importance of adherence to annual lung cancer LDCT screening, impact of co-morbidities, and ability or willingness to undergo diagnosis and treatment is imperative for compliance of the program.   Counseling on the importance of continued smoking cessation for former smokers; the importance of smoking cessation for current smokers, and information about tobacco cessation interventions have been given to patient including [place] and 1800 quit Elim programs.   Written order for lung cancer screening with LDCT has been given to the patient and any and all questions have been answered to the best of my abilities.    Yearly follow up will be coordinated by Burgess Estelle, Thoracic Navigator.  Beckey Rutter, DNP, AGNP-C [place] [facility] [phone] (work cell) [phone] (office) 09/27/18 3:11 PM

## 2018-09-28 ENCOUNTER — Telehealth: Payer: Self-pay | Admitting: *Deleted

## 2018-09-28 NOTE — Telephone Encounter (Signed)
Notified patient of LDCT lung cancer screening program results with recommendation for 12 month follow up imaging.  Also notified of incidental findings noted below and is encouraged to discuss further questions with PCP who will receive a copy of this not and/or the CT reports.  Patient verbalized understanding.     IMPRESSION: 1. Lung-RADS 1, negative. Continue annual screening with low-dose chest CT without contrast in 12 months. 2. Aortic atherosclerosis (ICD10-I70.0), coronary artery atherosclerosis and emphysema (ICD10-J43.9). 3. Small pericardial effusion. 4. Hepatic steatosis. 5. Bilateral adrenal adenomas.

## 2018-09-28 NOTE — Telephone Encounter (Signed)
Spoke with tiffany at Elisabeth Cara NP office and left message related to the results of the ldct screening, will fax results today as well. IMPRESSION: 1. Lung-RADS 1, negative. Continue annual screening with low-dose chest CT without contrast in 12 months. 2. Aortic atherosclerosis (ICD10-I70.0), coronary artery atherosclerosis and emphysema (ICD10-J43.9). 3. Small pericardial effusion. 4. Hepatic steatosis. 5. Bilateral adrenal adenomas.

## 2018-10-01 ENCOUNTER — Telehealth: Payer: Self-pay | Admitting: Cardiovascular Disease

## 2018-10-01 DIAGNOSIS — I3139 Other pericardial effusion (noninflammatory): Secondary | ICD-10-CM

## 2018-10-01 DIAGNOSIS — I313 Pericardial effusion (noninflammatory): Secondary | ICD-10-CM

## 2018-10-01 NOTE — Telephone Encounter (Signed)
Per pcp office patient lung cancer screening shows pericardial effusion and patient c/o leg cramps mag was normal   Please advise pcp / patient if appt needed to fu .

## 2018-10-01 NOTE — Telephone Encounter (Signed)
Left a message for the nurse to call back.

## 2018-10-01 NOTE — Telephone Encounter (Signed)
[facility] was calling to let Dr. Fletcher Anon know that the patient's recent CT of the chest showed a small pericardial effusion. The patient would like to know how and if to proceed with this result.

## 2018-10-01 NOTE — Telephone Encounter (Signed)
The pericardial effusion seems to be small and I doubt that it has any clinical significance.  Nonetheless, we can check this with an echocardiogram to make sure there is no hemodynamic compromise.

## 2018-10-02 NOTE — Telephone Encounter (Signed)
Call placed to the patient. She was unavailable. Will try back.

## 2018-10-05 NOTE — Telephone Encounter (Signed)
Left a message for the patient to call back.  

## 2018-10-08 NOTE — Telephone Encounter (Signed)
Patient has been informed of the ECHO recommendation. Order has been placed and scheduling notified.

## 2018-10-16 ENCOUNTER — Ambulatory Visit (INDEPENDENT_AMBULATORY_CARE_PROVIDER_SITE_OTHER): Payer: Medicare Other

## 2018-10-16 DIAGNOSIS — I313 Pericardial effusion (noninflammatory): Secondary | ICD-10-CM | POA: Diagnosis not present

## 2018-11-15 DIAGNOSIS — R809 Proteinuria, unspecified: Secondary | ICD-10-CM | POA: Insufficient documentation

## 2019-02-26 ENCOUNTER — Other Ambulatory Visit: Payer: Self-pay

## 2019-02-27 ENCOUNTER — Other Ambulatory Visit: Payer: Self-pay | Admitting: Internal Medicine

## 2019-02-27 ENCOUNTER — Other Ambulatory Visit: Payer: Self-pay

## 2019-02-27 ENCOUNTER — Inpatient Hospital Stay: Payer: Medicare Other | Attending: Internal Medicine

## 2019-02-27 DIAGNOSIS — D508 Other iron deficiency anemias: Secondary | ICD-10-CM | POA: Diagnosis present

## 2019-02-27 LAB — CBC WITH DIFFERENTIAL/PLATELET
Abs Immature Granulocytes: 0.05 10*3/uL (ref 0.00–0.07)
Basophils Absolute: 0.1 10*3/uL (ref 0.0–0.1)
Basophils Relative: 1 %
Eosinophils Absolute: 0.2 10*3/uL (ref 0.0–0.5)
Eosinophils Relative: 2 %
HCT: 38.6 % (ref 36.0–46.0)
Hemoglobin: 12.4 g/dL (ref 12.0–15.0)
Immature Granulocytes: 1 %
Lymphocytes Relative: 30 %
Lymphs Abs: 2.2 10*3/uL (ref 0.7–4.0)
MCH: 29.2 pg (ref 26.0–34.0)
MCHC: 32.1 g/dL (ref 30.0–36.0)
MCV: 90.8 fL (ref 80.0–100.0)
Monocytes Absolute: 0.6 10*3/uL (ref 0.1–1.0)
Monocytes Relative: 8 %
Neutro Abs: 4.2 10*3/uL (ref 1.7–7.7)
Neutrophils Relative %: 58 %
Platelets: 326 10*3/uL (ref 150–400)
RBC: 4.25 MIL/uL (ref 3.87–5.11)
RDW: 15.9 % — ABNORMAL HIGH (ref 11.5–15.5)
WBC: 7.2 10*3/uL (ref 4.0–10.5)
nRBC: 0 % (ref 0.0–0.2)

## 2019-02-27 LAB — IRON AND TIBC
Iron: 65 ug/dL (ref 28–170)
Saturation Ratios: 23 % (ref 10.4–31.8)
TIBC: 285 ug/dL (ref 250–450)
UIBC: 220 ug/dL

## 2019-02-27 LAB — FERRITIN: Ferritin: 375 ng/mL — ABNORMAL HIGH (ref 11–307)

## 2019-02-28 ENCOUNTER — Other Ambulatory Visit: Payer: Self-pay

## 2019-03-01 ENCOUNTER — Other Ambulatory Visit: Payer: Self-pay

## 2019-03-01 ENCOUNTER — Inpatient Hospital Stay: Payer: Medicare Other

## 2019-03-01 ENCOUNTER — Inpatient Hospital Stay: Payer: Medicare Other | Attending: Internal Medicine | Admitting: Internal Medicine

## 2019-03-01 DIAGNOSIS — F17210 Nicotine dependence, cigarettes, uncomplicated: Secondary | ICD-10-CM | POA: Diagnosis not present

## 2019-03-01 DIAGNOSIS — N184 Chronic kidney disease, stage 4 (severe): Secondary | ICD-10-CM | POA: Diagnosis not present

## 2019-03-01 DIAGNOSIS — D631 Anemia in chronic kidney disease: Secondary | ICD-10-CM | POA: Diagnosis not present

## 2019-03-01 DIAGNOSIS — D508 Other iron deficiency anemias: Secondary | ICD-10-CM

## 2019-03-01 NOTE — Progress Notes (Signed)
I connected with  Franki Cabot on 03/01/19 at 10:30 AM EDTby telephone and verified that I am speaking with the patient using 2 identifiers.  # LOCATION:  Patient:home   Provider: office  I discussed the limitations, risks, security and privacy concerns of performing an evaluation and management service by telephone and the availability of in person appointments.  I also discussed with the patient that there may be a patient responsible charge related to the service.  The patient expressed understanding and agrees to proceed.  History of present illness:Caitlin Bass 68 y.o.  female with history of iron deficient anemia secondary to chronic kidney disease.  Patient was recently evaluated PCP noted to have low energy levels./Also low iron.  Patient started taking extra iron pills.  She feels improved at this time.  Observation/objective: Hemoglobin 12.3 iron saturation 23%.  Ferritin 300+.  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  patient fairly asymptomatic. saturation 23%; ferritin 355 Continue PO iron once a day. NO IV iron infusion.   # CKD-stage IV creatinine-2.4- STABLE.   # Smoking/ trying to quit- Discussed with the patient regarding the ill effects of smoking- including but not limited to cardiac lung and vascular diseases and malignancies. Counseled against smoking; patient-not interested in quitting.  lCSP; interested.  #Hypomagnesemia magnesium recently 1.6.; STABLE.   # Educated the patient regarding novel coronavirus-modes of transmission/risks; and measures to avoid infection.   # DISPOSITION: #Follow-up 6 months/MD-labs CBC BMP iron studies ferritin/possible Feraheme- Dr.B  KW:[id number] Frederico Hamman; ; Shawn Pekins/Hayley Rhode    Follow-up instructions:  I discussed the assessment and treatment plan with the patient.  The patient was provided an opportunity to ask questions and all  were answered.  The patient agreed with the plan and demonstrated understanding of instructions.  The patient was advised to call back or seek an in person evaluation if the symptoms worsen or if the condition fails to improve as anticipated.  I provided 5 minutes of non-face-to-face time during this encounter   Dr. Charlaine Dalton [facility] 03/01/2019 12:37 PM

## 2019-03-01 NOTE — Assessment & Plan Note (Addendum)
#    Anemia Iron deficiency- hemoglobin today 12.4.  patient fairly asymptomatic. saturation 23%; ferritin 355 Continue PO iron once a day. NO IV iron infusion.   # CKD-stage IV creatinine-2.4- STABLE.   # Smoking/ trying to quit- Discussed with the patient regarding the ill effects of smoking- including but not limited to cardiac lung and vascular diseases and malignancies. Counseled against smoking; patient-not interested in quitting.  lCSP; interested.  #Hypomagnesemia magnesium recently 1.6.; STABLE.   # Educated the patient regarding novel coronavirus-modes of transmission/risks; and measures to avoid infection.   # DISPOSITION: #Follow-up 6 months/MD-labs CBC BMP iron studies ferritin/possible Feraheme- Dr.B  NL:[id number] Frederico Hamman; ; Shawn Pekins/Hayley [place]

## 2019-05-01 ENCOUNTER — Telehealth: Payer: Self-pay

## 2019-05-01 NOTE — Telephone Encounter (Signed)
Spoke with patient to schedule overdue F/U with Dr. Fletcher Anon. Patient declined telehealth visit during [place] 19 precautions. She does not have a smart phone and "does not see the point of the visit if he can't see me". Advised patient recall will be placed for 06/29/2019 at which time she can call our office to reschedule.

## 2019-08-29 NOTE — Progress Notes (Deleted)
Called patient no answer left message  

## 2019-08-30 ENCOUNTER — Inpatient Hospital Stay: Payer: Medicare Other | Attending: Internal Medicine

## 2019-08-30 ENCOUNTER — Inpatient Hospital Stay: Payer: Medicare Other

## 2019-08-30 ENCOUNTER — Inpatient Hospital Stay: Payer: Medicare Other | Admitting: Internal Medicine

## 2019-08-30 NOTE — Assessment & Plan Note (Deleted)
#    Anemia Iron deficiency- hemoglobin today 12.4.  patient fairly asymptomatic. saturation 23%; ferritin 355 Continue PO iron once a day. NO IV iron infusion.   # CKD-stage IV creatinine-2.4- STABLE.   # Smoking/ trying to quit- Discussed with the patient regarding the ill effects of smoking- including but not limited to cardiac lung and vascular diseases and malignancies. Counseled against smoking; patient-not interested in quitting.  lCSP; interested.  #Hypomagnesemia magnesium recently 1.6.; STABLE.   # Educated the patient regarding novel coronavirus-modes of transmission/risks; and measures to avoid infection.   # DISPOSITION: #Follow-up 6 months/MD-labs CBC BMP iron studies ferritin/possible Feraheme- Dr.B  Cc:Nicole Spencer; ; Shawn Pekins/Hayley Rhode 

## 2019-08-30 NOTE — Progress Notes (Deleted)
[facility] OFFICE PROGRESS NOTE  Patient Care Team: Elisabeth Cara, NP as PCP - General (Nurse Practitioner)   SUMMARY OF ONCOLOGIC HISTORY:  # March 2017- Anemia- Hb 8; plan Iv iron s/p IV iron x2 [[april 2017]; [place] May 2017.   # CKD [? nephrologist as per pt; ? Dr.Swain]/ CAD  # AUG 2016- ELEVATED K/L ratio 1.99; SPEP- Neg.    INTERVAL HISTORY:  68 year old pleasant female patient with above history of anemia secondary to iron deficiency/CKD.  Denies any nausea vomiting.  Denies any chest pain or cough.  No blood in stools black or stools.  No fatigue.  Review of Systems  Constitutional: Negative for chills, diaphoresis, fever, malaise/fatigue and weight loss.  HENT: Negative for nosebleeds and sore throat.   Eyes: Negative for double vision.  Respiratory: Negative for cough, hemoptysis, sputum production, shortness of breath and wheezing.   Cardiovascular: Negative for chest pain, palpitations, orthopnea and leg swelling.  Gastrointestinal: Negative for abdominal pain, blood in stool, constipation, diarrhea, heartburn, melena, nausea and vomiting.  Genitourinary: Negative for dysuria, frequency and urgency.  Musculoskeletal: Negative for back pain and joint pain.  Skin: Negative.  Negative for itching and rash.  Neurological: Negative for dizziness, tingling, focal weakness, weakness and headaches.  Endo/Heme/Allergies: Does not bruise/bleed easily.  Psychiatric/Behavioral: Negative for depression. The patient is not nervous/anxious and does not have insomnia.      PAST MEDICAL HISTORY :  Past Medical History:  Diagnosis Date  . Anemia   . Anxiety   . Arthritis   . CKD (chronic kidney disease), stage IV ([place])   . Coronary artery disease    a. 03/2014 NSTEMI/Cath: LM nl, LAD 53m, 60d, D1 60, D2 nl, d3 40, LCX 100 w/ L->L collats, RCA non dom, min irregs, EF 55%-->Med Rx.  . DDD (degenerative disc disease)    a. s/p upper back surgery spring  2015.  . Diabetes mellitus without complication ([place])   . Diverticulosis   . Fibromyalgia   . GERD (gastroesophageal reflux disease)   . Hiatal hernia   . Hyperlipidemia   . Hypertensive heart disease    a. 03/2015 Echo: 55-60%, mild conc LVH.  Marland Kitchen Syncope and collapse    a. Early 03/2014->did not seek medical attn.  . Tobacco abuse     PAST SURGICAL HISTORY :   Past Surgical History:  Procedure Laterality Date  . BREAST CYST ASPIRATION Right 2005   NEG  . CARDIAC CATHETERIZATION  06/2005   [facility]  . CARDIAC CATHETERIZATION  04/2014   [facility]  . COLONOSCOPY WITH PROPOFOL N/A 04/27/2016   Procedure: COLONOSCOPY WITH PROPOFOL;  Surgeon: Manya Silvas, MD;  Location: [facility] ENDOSCOPY;  Service: Endoscopy;  Laterality: N/A;  . CORONARY ANGIOPLASTY    . DILATATION & CURETTAGE/HYSTEROSCOPY WITH MYOSURE N/A 09/18/2015   Procedure: DILATATION & CURETTAGE/HYSTEROSCOPY WITH MYOSURE;  Surgeon: Boykin Nearing, MD;  Location: [facility] ORS;  Service: Gynecology;  Laterality: N/A;  . ESOPHAGOGASTRODUODENOSCOPY (EGD) WITH PROPOFOL N/A 04/27/2016   Procedure: ESOPHAGOGASTRODUODENOSCOPY (EGD) WITH PROPOFOL;  Surgeon: Manya Silvas, MD;  Location: [facility] ENDOSCOPY;  Service: Endoscopy;  Laterality: N/A;  . NECK SURGERY     Degenerative Disk Disease and removal of a spinal cyst  . TUBAL LIGATION      FAMILY HISTORY :   Family History  Problem Relation Age of Onset  . Heart disease Mother   . Heart attack Mother   . Hypertension Mother   . Hyperlipidemia Mother   .  Breast cancer Sister     SOCIAL HISTORY:   Social History   Tobacco Use  . Smoking status: Current Every Day Smoker    Packs/day: 0.75    Years: 40.00    Pack years: 30.00    Types: Cigarettes  . Smokeless tobacco: Never Used  Substance Use Topics  . Alcohol use: No  . Drug use: No    ALLERGIES:  is allergic to aspirin and hydrochlorothiazide.  MEDICATIONS:  Current Outpatient Medications  Medication Sig Dispense Refill   . allopurinol (ZYLOPRIM) 100 MG tablet Take 100 mg by mouth 2 (two) times daily.    Marland Kitchen amLODipine (NORVASC) 5 MG tablet Take 1 tablet (5 mg total) by mouth daily. 30 tablet 3  . aspirin 81 MG chewable tablet Chew 81 mg by mouth daily.    Marland Kitchen atorvastatin (LIPITOR) 40 MG tablet Take 40 mg by mouth daily at 6 PM.     . Calcium Carb-Cholecalciferol (CALCIUM 1000 + D PO) Take by mouth.    . cetirizine (ZYRTEC) 10 MG tablet Take 10 mg by mouth daily.    . colchicine 0.6 MG tablet Take 1 tablet twice daily for 3 days then 1 tablet daily.    . cromolyn (OPTICROM) 4 % ophthalmic solution 1 drop 4 (four) times daily.    . fluticasone (FLONASE) 50 MCG/ACT nasal spray 1 spray by Each Nare route daily.    . insulin glargine (LANTUS) 100 UNIT/ML injection Inject 35 Units into the skin every morning. 44 units at bedtime.    . Insulin Lispro, Human, (HUMALOG KWIKPEN [place]) Inject 8 Units into the skin every morning. 28 units at lunch and supper    . linaclotide (LINZESS) 72 MCG capsule Take 72 mcg by mouth daily.    Marland Kitchen lisinopril (PRINIVIL,ZESTRIL) 5 MG tablet Take 5 mg by mouth at bedtime.    . magnesium oxide (MAG-OX) 400 MG tablet Take 400 mg by mouth 2 (two) times daily.    . meclizine (ANTIVERT) 25 MG tablet Take 1 tablet (25 mg total) by mouth 3 (three) times daily as needed for dizziness. (Patient not taking: Reported on 03/01/2019) 30 tablet 0  . metoprolol tartrate (LOPRESSOR) 25 MG tablet Take 1 tablet (25 mg total) by mouth 2 (two) times daily. 60 tablet 3  . Omega-3 Fatty Acids (FISH OIL MAXIMUM STRENGTH) 1200 MG CAPS Take 1,000 mg by mouth 2 (two) times daily.   0  . pantoprazole (PROTONIX) 20 MG tablet   0  . traMADol (ULTRAM) 50 MG tablet Take 1 tablet (50 mg total) by mouth 2 (two) times daily. (Patient not taking: Reported on 03/01/2019) 10 tablet 0  . triamcinolone cream (KENALOG) 0.1 % Apply 1 application topically 2 (two) times daily.     No current facility-administered medications for this visit.      PHYSICAL EXAMINATION:   There were no vitals taken for this visit.  There were no vitals filed for this visit.  Physical Exam  Constitutional: She is oriented to person, place, and time and well-developed, well-nourished, and in no distress.  HENT:  Head: Normocephalic and atraumatic.  Mouth/Throat: Oropharynx is clear and moist. No oropharyngeal exudate.  Eyes: Pupils are equal, round, and reactive to light.  Neck: Normal range of motion. Neck supple.  Cardiovascular: Normal rate and regular rhythm.  Pulmonary/Chest: No respiratory distress. She has no wheezes.  Abdominal: Soft. Bowel sounds are normal. She exhibits no distension and no mass. There is no abdominal tenderness. There is no  rebound and no guarding.  Musculoskeletal: Normal range of motion.        General: No tenderness or edema.  Neurological: She is alert and oriented to person, place, and time.  Skin: Skin is warm.  Psychiatric: Affect normal.    LABORATORY DATA:  I have reviewed the data as listed    Component Value Date/Time   NA 137 05/02/2018 1640   NA 139 10/21/2014 1332   K 4.4 05/02/2018 1640   K 4.4 10/21/2014 1332   CL 109 05/02/2018 1640   CL 108 (H) 10/21/2014 1332   CO2 19 (L) 05/02/2018 1640   CO2 24 10/21/2014 1332   GLUCOSE 74 05/02/2018 1640   GLUCOSE 103 (H) 10/21/2014 1332   BUN 38 (H) 05/02/2018 1640   BUN 21 (H) 10/21/2014 1332   CREATININE 2.40 (H) 05/02/2018 1640   CREATININE 1.45 (H) 10/21/2014 1332   CALCIUM 9.1 05/02/2018 1640   CALCIUM 9.1 10/21/2014 1332   PROT 7.5 08/25/2017 1358   PROT 7.5 10/21/2014 1332   ALBUMIN 3.9 08/25/2017 1358   ALBUMIN 3.6 10/21/2014 1332   AST 26 08/25/2017 1358   AST 15 10/21/2014 1332   ALT 28 08/25/2017 1358   ALT 27 10/21/2014 1332   ALKPHOS 95 08/25/2017 1358   ALKPHOS 94 10/21/2014 1332   BILITOT 0.4 08/25/2017 1358   BILITOT 0.2 10/21/2014 1332   GFRNONAA 20 (L) 05/02/2018 1640   GFRNONAA 39 (L) 10/21/2014 1332   GFRNONAA 27  (L) 05/10/2014 0031   GFRAA 23 (L) 05/02/2018 1640   GFRAA 47 (L) 10/21/2014 1332   GFRAA 32 (L) 05/10/2014 0031    No results found for: SPEP, UPEP  Lab Results  Component Value Date   WBC 7.2 02/27/2019   NEUTROABS 4.2 02/27/2019   HGB 12.4 02/27/2019   HCT 38.6 02/27/2019   MCV 90.8 02/27/2019   PLT 326 02/27/2019      Chemistry      Component Value Date/Time   NA 137 05/02/2018 1640   NA 139 10/21/2014 1332   K 4.4 05/02/2018 1640   K 4.4 10/21/2014 1332   CL 109 05/02/2018 1640   CL 108 (H) 10/21/2014 1332   CO2 19 (L) 05/02/2018 1640   CO2 24 10/21/2014 1332   BUN 38 (H) 05/02/2018 1640   BUN 21 (H) 10/21/2014 1332   CREATININE 2.40 (H) 05/02/2018 1640   CREATININE 1.45 (H) 10/21/2014 1332      Component Value Date/Time   CALCIUM 9.1 05/02/2018 1640   CALCIUM 9.1 10/21/2014 1332   ALKPHOS 95 08/25/2017 1358   ALKPHOS 94 10/21/2014 1332   AST 26 08/25/2017 1358   AST 15 10/21/2014 1332   ALT 28 08/25/2017 1358   ALT 27 10/21/2014 1332   BILITOT 0.4 08/25/2017 1358   BILITOT 0.2 10/21/2014 1332       ASSESSMENT & PLAN:   No problem-specific Assessment & Plan notes found for this encounter.      Cammie Sickle, MD 08/30/2019 8:09 AM

## 2019-09-17 ENCOUNTER — Other Ambulatory Visit: Payer: Self-pay | Admitting: Family Medicine

## 2019-09-17 DIAGNOSIS — Z1231 Encounter for screening mammogram for malignant neoplasm of breast: Secondary | ICD-10-CM

## 2019-09-17 NOTE — Progress Notes (Signed)
Cardiology Office Note    Date:  09/20/2019   ID:  Caitlin Bass, DOB May 11, 1951, MRN [id number]  PCP:  Elisabeth Cara, NP  Cardiologist:  Kathlyn Sacramento, MD  Electrophysiologist:  None   Chief Complaint: Shortness of breath  History of Present Illness:   Caitlin Bass is a 68 y.o. female with history of CAD with non-ST elevation MI in 03/2014 as detailed below, CKD stage IV, MGUS, diabetes, fibromyalgia, anemia of chronic disease, hypertension, hyperlipidemia, vertigo, reported syncope, diverticulosis, degenerative disc disease, COPD with ongoing tobacco abuse, OSA on CPAP, hepatic steatosis, and GERD who presents for evaluation of shortness of breath.  Patient was admitted to the hospital in 03/2014 with a non-STEMI.  Echo showed normal LV systolic function.  Cardiac cath showed an occluded LCx with left to left collaterals with otherwise nonobstructive disease.  She was medically managed.  She was most recently seen in the office in 01/2018 and was doing reasonably well, though continued to smoke.  Echo from 01/2017 showed normal LV systolic function, mild mitral regurgitation, and no evidence of pulmonary hypertension.  She was seen in the ED in 03/2018 with vertigo.  Lung cancer screening CT in 09/2018 showed an incidental small pericardial effusion with coronary artery calcification and aortic atherosclerosis.  In this setting, she underwent echo on 10/16/2018 which showed a hyperdynamic LV systolic function with an EF of 70 to 75%, severe LVH, grade 1 diastolic dysfunction, trivial aortic valve regurgitation, significant mitral annular calcification, moderately dilated left atrium, normal RV cavity size and systolic function, and trivial pericardial effusion.  Patient comes in today noting she was seen by her PCP approximately 2 months prior and noted several complaints including a sharp shooting intermittent pain down her right leg that was not associated with ambulation.  This was  ultimately felt to be related to her chronic back pain and possibly sciatica.  She also noted some shortness of breath that she was unable to tell them or me today how long this had been present.  She indicated shortness of breath was worse with exertion though at times she felt like she was "holding my breath."  She denies any chest pain, palpitations, dizziness, presyncope, syncope, lower extremity swelling, abdominal surgeon, orthopnea, PND, or early satiety.  No cough.  There has been some associated nasal congestion and postnasal drip.  Patient did note some shortness of breath when ambulating into our office today from the parking lot and denied any symptoms concerning for claudication.  She continues to smoke approximately 1/2 pack daily.  She does report labs were checked at her PCPs office 2 months prior and states the only abnormalities found were elevated cholesterol and blood sugar.  We do not have these for review.   Labs: 02/2019 - potassium 5.8, BUN 44, serum creatinine 3.11, albumin 3.8, Hgb 12.4, PLT 326   Past Medical History:  Diagnosis Date   Anemia    Anxiety    Arthritis    CKD (chronic kidney disease), stage IV ([facility])    Coronary artery disease    a. 03/2014 NSTEMI/Cath: LM nl, LAD 85m, 60d, D1 60, D2 nl, d3 40, LCX 100 w/ L->L collats, RCA non dom, min irregs, EF 55%-->Med Rx.   DDD (degenerative disc disease)    a. s/p upper back surgery spring 2015.   Diabetes mellitus without complication ([facility])    Diverticulosis    Fibromyalgia    GERD (gastroesophageal reflux disease)    Hiatal hernia  Hyperlipidemia    Hypertensive heart disease    a. 03/2015 Echo: 55-60%, mild conc LVH.   Syncope and collapse    a. Early 03/2014->did not seek medical attn.   Tobacco abuse     Past Surgical History:  Procedure Laterality Date   BREAST CYST ASPIRATION Right 2005   NEG   CARDIAC CATHETERIZATION  06/2005   [facility]   CARDIAC CATHETERIZATION  04/2014   [facility]    COLONOSCOPY WITH PROPOFOL N/A 04/27/2016   Procedure: COLONOSCOPY WITH PROPOFOL;  Surgeon: Manya Silvas, MD;  [facility] ENDOSCOPY;  Service: Endoscopy;  Laterality: N/A;   CORONARY ANGIOPLASTY     DILATATION & CURETTAGE/HYSTEROSCOPY WITH MYOSURE N/A 09/18/2015   Procedure: DILATATION & CURETTAGE/HYSTEROSCOPY WITH MYOSURE;  Surgeon: Boykin Nearing, MD;  Location: [facility] ORS;  Service: Gynecology;  Laterality: N/A;   ESOPHAGOGASTRODUODENOSCOPY (EGD) WITH PROPOFOL N/A 04/27/2016   Procedure: ESOPHAGOGASTRODUODENOSCOPY (EGD) WITH PROPOFOL;  Surgeon: Manya Silvas, MD;  Location: [facility] ENDOSCOPY;  Service: Endoscopy;  Laterality: N/A;   NECK SURGERY     Degenerative Disk Disease and removal of a spinal cyst   TUBAL LIGATION      Current Medications: Current Meds  Medication Sig   allopurinol (ZYLOPRIM) 100 MG tablet Take 100 mg by mouth 2 (two) times daily.   amLODipine (NORVASC) 5 MG tablet Take 1 tablet (5 mg total) by mouth daily.   aspirin 81 MG chewable tablet Chew 81 mg by mouth daily.   atorvastatin (LIPITOR) 40 MG tablet Take 40 mg by mouth daily at 6 PM.    Calcium Carb-Cholecalciferol (CALCIUM 1000 + D PO) Take by mouth.   cetirizine (ZYRTEC) 10 MG tablet Take 10 mg by mouth daily.   colchicine 0.6 MG tablet Take 1 tablet twice daily for 3 days then 1 tablet daily.   cromolyn (OPTICROM) 4 % ophthalmic solution 1 drop 4 (four) times daily.   fluticasone (FLONASE) 50 MCG/ACT nasal spray 1 spray by Each Nare route daily.   insulin glargine (LANTUS) 100 UNIT/ML injection Inject 35 Units into the skin every morning. 44 units at bedtime.   Insulin Lispro, Human, (HUMALOG KWIKPEN [place]) Inject 8 Units into the skin every morning. 28 units at lunch and supper   linaclotide (LINZESS) 72 MCG capsule Take 72 mcg by mouth daily.   lisinopril (PRINIVIL,ZESTRIL) 5 MG tablet Take 5 mg by mouth at bedtime.   magnesium oxide (MAG-OX) 400 MG tablet Take 400 mg by mouth 2  (two) times daily.   metoprolol tartrate (LOPRESSOR) 25 MG tablet Take 1 tablet (25 mg total) by mouth 2 (two) times daily.   Omega-3 Fatty Acids (FISH OIL MAXIMUM STRENGTH) 1200 MG CAPS Take 1,000 mg by mouth 2 (two) times daily.    pantoprazole (PROTONIX) 20 MG tablet    triamcinolone cream (KENALOG) 0.1 % Apply 1 application topically 2 (two) times daily.    Allergies:   Aspirin and Hydrochlorothiazide   Social History   Socioeconomic History   Marital status: Single    Spouse name: Not on file   Number of children: Not on file   Years of education: Not on file   Highest education level: Not on file  Occupational History   Not on file  Social Needs   Financial resource strain: Not on file   Food insecurity    Worry: Not on file    Inability: Not on file   Transportation needs    Medical: Not on file    Non-medical:  Not on file  Tobacco Use   Smoking status: Current Every Day Smoker    Packs/day: 0.50    Years: 40.00    Pack years: 20.00    Types: Cigarettes   Smokeless tobacco: Never Used  Substance and Sexual Activity   Alcohol use: No   Drug use: No   Sexual activity: Not on file  Lifestyle   Physical activity    Days per week: Not on file    Minutes per session: Not on file   Stress: Not on file  Relationships   Social connections    Talks on phone: Not on file    Gets together: Not on file    Attends religious service: Not on file    Active member of club or organization: Not on file    Attends meetings of clubs or organizations: Not on file    Relationship status: Not on file  Other Topics Concern   Not on file  Social History Narrative   Not on file     Family History:  The patient's family history includes Breast cancer in her sister; Heart attack in her mother; Heart disease in her mother; Hyperlipidemia in her mother; Hypertension in her mother.  ROS:   Review of Systems  Constitutional: Positive for malaise/fatigue.  Negative for chills, diaphoresis, fever and weight loss.  HENT: Negative for congestion.   Eyes: Negative for discharge and redness.  Respiratory: Positive for shortness of breath. Negative for cough, hemoptysis, sputum production and wheezing.   Cardiovascular: Negative for chest pain, palpitations, orthopnea, claudication, leg swelling and PND.  Gastrointestinal: Negative for abdominal pain, blood in stool, heartburn, melena, nausea and vomiting.  Genitourinary: Negative for hematuria.  Musculoskeletal: Positive for back pain and myalgias. Negative for falls.  Skin: Negative for rash.  Neurological: Positive for weakness. Negative for dizziness, tingling, tremors, sensory change, speech change, focal weakness and loss of consciousness.  Endo/Heme/Allergies: Does not bruise/bleed easily.  Psychiatric/Behavioral: Negative for substance abuse. The patient is not nervous/anxious.   All other systems reviewed and are negative.    EKGs/Labs/Other Studies Reviewed:    Studies reviewed were summarized above. The additional studies were reviewed today: As above.   EKG:  EKG is ordered today.  The EKG ordered today demonstrates NSR, 69 bpm, possible left atrial enlargement, left axis deviation, poor R wave progression along the precordial leads, cannot exclude prior septal infarct, baseline wandering, no acute ST-T changes  Recent Labs: 02/27/2019: Hemoglobin 12.4; Platelets 326  Recent Lipid Panel    Component Value Date/Time   CHOL 291 (H) 07/23/2013 0429   TRIG 550 (H) 07/23/2013 0429   HDL 37 (L) 07/23/2013 0429   VLDL SEE COMMENT 07/23/2013 0429   LDLCALC SEE COMMENT 07/23/2013 0429    PHYSICAL EXAM:    VS:  BP (!) 150/70 (BP Location: Left Arm, Patient Position: Sitting, Cuff Size: Normal)    Pulse 69    Temp (!) 96.6 F (35.9 C)    Ht 5\' 3"  (1.6 m)    Wt 172 lb 8 oz (78.2 kg)    BMI 30.56 kg/m   BMI: Body mass index is 30.56 kg/m.  Physical Exam  Constitutional: She is oriented  to person, place, and time. She appears well-developed and well-nourished.  HENT:  Head: Normocephalic and atraumatic.  Eyes: Right eye exhibits no discharge. Left eye exhibits no discharge.  Neck: Normal range of motion. No JVD present.  Cardiovascular: Normal rate, regular rhythm, S1 normal, S2 normal and  normal heart sounds. Exam reveals no distant heart sounds, no friction rub, no midsystolic click and no opening snap.  No murmur heard. Pulses:      Dorsalis pedis pulses are 2+ on the right side and 2+ on the left side.       Posterior tibial pulses are 2+ on the right side and 2+ on the left side.  Pulmonary/Chest: Effort normal and breath sounds normal. No respiratory distress. She has no decreased breath sounds. She has no wheezes. She has no rales. She exhibits no tenderness.  Abdominal: Soft. She exhibits no distension. There is no abdominal tenderness.  Musculoskeletal:        General: No edema.  Neurological: She is alert and oriented to person, place, and time.  Skin: Skin is warm and dry. No cyanosis. Nails show no clubbing.  Psychiatric: She has a normal mood and affect. Her speech is normal and behavior is normal. Judgment and thought content normal.    Wt Readings from Last 3 Encounters:  09/20/19 172 lb 8 oz (78.2 kg)  09/27/18 170 lb (77.1 kg)  08/31/18 172 lb 6.4 oz (78.2 kg)     ASSESSMENT & PLAN:   1. Dyspnea: Of uncertain duration, though lasting at least 2 months.  Patient indicates recent blood work performed by PCPs office was unrevealing outside of elevated cholesterol and blood sugar.  She indicates blood count was normal.  We will contact PCPs office and try and obtain these labs.  Symptoms are somewhat mixed in that she does note some exertional dyspnea though also states it feels like she is "holding my breath."  Schedule echo and Lexiscan Myoview.  2. CAD involving the native coronary arteries without angina: Patient denies any symptoms of chest pain.  She  is no longer on aspirin secondary to "stomach pain."  Recommend she evaluate this further with her PCP.  Continue Lipitor.  Transition from metoprolol to carvedilol as outlined below.  Echo and Lexiscan scheduled as above.  3. CKD stage IV: Remains on low-dose ACE inhibitor.  Follow-up with nephrology as directed.  4. Anemia of chronic disease: Most recent CBC from 02/2019 showed improved hemoglobin of 12.4.  Contact PCPs office for recent CBC.  Follow-up with PCP as directed.  5. HTN: Blood pressure is mildly elevated today at 150/70.  He does not check BP at home.  Transition from metoprolol to carvedilol 25 mg twice daily with likely titration in follow-up for added BP effect.  Continue current dose of amlodipine and lisinopril.  Patient declines escalation of amlodipine at this time.  6. HLD: Remains on Lipitor.  Contact PCP office for recent cholesterol panel.  Followed by PCP.  7. OSA: Reports compliance with CPAP.  8. COPD with ongoing tobacco abuse: No obvious evidence of exacerbation.  She continues to smoke less than half pack per day.  Complete cessation is recommended.  Follow-up with PCP as directed.  9. Leg pain: Symptoms are not consistent with claudication with 2+ bilateral distal pedal pulses.  Patient indicates he has been diagnosed with sciatica.  Follow-up with PCP as directed.  10. History of pericardial effusion: Vital signs stable.  Update echo as above.  Disposition: F/u with Dr. Fletcher Anon or an APP in 2 months.   Medication Adjustments/Labs and Tests Ordered: Current medicines are reviewed at length with the patient today.  Concerns regarding medicines are outlined above. Medication changes, Labs and Tests ordered today are summarized above and listed in the Patient Instructions accessible in Encounters.  Signed, Christell Faith, PA-C 09/20/2019 8:46 AM     [address] [place], [place] [address] [phone]

## 2019-09-20 ENCOUNTER — Ambulatory Visit (INDEPENDENT_AMBULATORY_CARE_PROVIDER_SITE_OTHER): Payer: Medicare Other | Admitting: Physician Assistant

## 2019-09-20 ENCOUNTER — Encounter: Payer: Self-pay | Admitting: Physician Assistant

## 2019-09-20 ENCOUNTER — Telehealth: Payer: Self-pay

## 2019-09-20 ENCOUNTER — Other Ambulatory Visit: Payer: Self-pay

## 2019-09-20 VITALS — BP 150/70 | HR 69 | Temp 96.6°F | Ht 63.0 in | Wt 172.5 lb

## 2019-09-20 DIAGNOSIS — R0602 Shortness of breath: Secondary | ICD-10-CM

## 2019-09-20 DIAGNOSIS — N184 Chronic kidney disease, stage 4 (severe): Secondary | ICD-10-CM | POA: Diagnosis not present

## 2019-09-20 DIAGNOSIS — I313 Pericardial effusion (noninflammatory): Secondary | ICD-10-CM

## 2019-09-20 DIAGNOSIS — I2510 Atherosclerotic heart disease of native coronary artery without angina pectoris: Secondary | ICD-10-CM

## 2019-09-20 DIAGNOSIS — D638 Anemia in other chronic diseases classified elsewhere: Secondary | ICD-10-CM | POA: Diagnosis not present

## 2019-09-20 DIAGNOSIS — I10 Essential (primary) hypertension: Secondary | ICD-10-CM

## 2019-09-20 DIAGNOSIS — Z720 Tobacco use: Secondary | ICD-10-CM

## 2019-09-20 DIAGNOSIS — J439 Emphysema, unspecified: Secondary | ICD-10-CM

## 2019-09-20 DIAGNOSIS — I3139 Other pericardial effusion (noninflammatory): Secondary | ICD-10-CM

## 2019-09-20 MED ORDER — CARVEDILOL 6.25 MG PO TABS: 6.2500 mg | ORAL_TABLET | Freq: Two times a day (BID) | ORAL | 3 refills | Status: DC

## 2019-09-20 NOTE — Patient Instructions (Signed)
Medication Instructions:  1- STOP Metoprolol 2- START Coreg Take 1 tablet (6.25 mg total) by mouth 2 (two) times daily *If you need a refill on your cardiac medications before your next appointment, please call your pharmacy*  Lab Work: None ordered  If you have labs (blood work) drawn today and your tests are completely normal, you will receive your results only by: Marland Kitchen MyChart Message (if you have MyChart) OR . A paper copy in the mail If you have any lab test that is abnormal or we need to change your treatment, we will call you to review the results.  Testing/Procedures: 1- Echo  Please return to [facility] on ______________ at _______________ AM/PM for an Echocardiogram. Your physician has requested that you have an echocardiogram. Echocardiography is a painless test that uses sound waves to create images of your heart. It provides your doctor with information about the size and shape of your heart and how well your heart's chambers and valves are working. This procedure takes approximately one hour. There are no restrictions for this procedure. Please note; depending on visual quality an IV may need to be placed.   2- [place]  Your caregiver has ordered a Stress Test with nuclear imaging. The purpose of this test is to evaluate the blood supply to your heart muscle. This procedure is referred to as a "Non-Invasive Stress Test." This is because other than having an IV started in your vein, nothing is inserted or "invades" your body. Cardiac stress tests are done to find areas of poor blood flow to the heart by determining the extent of coronary artery disease (CAD). Some patients exercise on a treadmill, which naturally increases the blood flow to your heart, while others who are  unable to walk on a treadmill due to physical limitations have a pharmacologic/chemical stress agent called Lexiscan . This medicine will mimic walking on a treadmill by temporarily increasing  your coronary blood flow.   Please note: these test may take anywhere between 2-4 hours to complete  PLEASE REPORT TO [place] AT THE FIRST DESK WILL DIRECT YOU WHERE TO GO  Date of Procedure:_____________________________________  Arrival Time for Procedure:______________________________  Instructions regarding medication:   __x__ : Hold diabetes medication morning of procedure (take 1/2 dose evening insulin night before, hold short acting insulin the morning of)  __x__:  Hold betablocker(s) night before procedure and morning of procedure (coreg)  ____  PLEASE NOTIFY THE OFFICE AT LEAST 24 HOURS IN ADVANCE IF YOU ARE UNABLE TO [place].  [phone] AND  PLEASE NOTIFY NUCLEAR MEDICINE AT [facility] AT LEAST 24 HOURS IN ADVANCE IF YOU ARE UNABLE TO KEEP YOUR APPOINTMENT. [phone]  How to prepare for your Myoview test:  1. Do not eat or drink after midnight 2. No caffeine for 24 hours prior to test 3. No smoking 24 hours prior to test. 4. Your medication may be taken with water.  If your doctor stopped a medication because of this test, do not take that medication. 5. Ladies, please do not wear dresses.  Skirts or pants are appropriate. Please wear a short sleeve shirt. 6. No perfume, cologne or lotion. 7. Wear comfortable walking shoes. No heels!   Follow-Up: At [facility], you and your health needs are our priority.  As part of our continuing mission to provide you with exceptional heart care, we have created designated Provider Care Teams.  These Care Teams include your primary Cardiologist (physician)  and Advanced Practice Providers (APPs -  Physician Assistants and Nurse Practitioners) who all work together to provide you with the care you need, when you need it.  Your next appointment:   2 months  The format for your next appointment:   In Person  Provider:    You may see Kathlyn Sacramento, MD or Christell Faith, PA-C.

## 2019-09-20 NOTE — Telephone Encounter (Signed)
Pt seen in clinic this morning. She is d/t pick up medications this afternoon from [place] [facility] in blister pack. She requested that I call them and make sure medication change made today will be up to date.   No answer, left detailed message and call back number provided.   Rx sent electronically.

## 2019-09-23 ENCOUNTER — Telehealth: Payer: Self-pay | Admitting: Physician Assistant

## 2019-09-23 MED ORDER — CARVEDILOL 6.25 MG PO TABS: 6.2500 mg | ORAL_TABLET | Freq: Two times a day (BID) | ORAL | 3 refills | Status: DC

## 2019-09-23 NOTE — Telephone Encounter (Signed)
Please call to discuss medications. Coreg.

## 2019-09-23 NOTE — Telephone Encounter (Signed)
Returned call to [place], she was not available at that time.   She will call back.

## 2019-09-23 NOTE — Addendum Note (Signed)
Addended by: Verlon Au on: 09/23/2019 03:38 PM   Modules accepted: Orders

## 2019-09-23 NOTE — Telephone Encounter (Signed)
rx sent to [facility], pt needs medication to go through [place] [facility]. [facility] resent [facility].   No further needs at this time.

## 2019-09-27 ENCOUNTER — Telehealth: Payer: Self-pay | Admitting: *Deleted

## 2019-09-27 DIAGNOSIS — Z87891 Personal history of nicotine dependence: Secondary | ICD-10-CM

## 2019-09-27 DIAGNOSIS — Z122 Encounter for screening for malignant neoplasm of respiratory organs: Secondary | ICD-10-CM

## 2019-09-27 NOTE — Telephone Encounter (Signed)
Patient has been notified that annual lung cancer screening low dose CT scan is due currently or will be in near future. Confirmed that patient is within the age range of 55-77, and asymptomatic, (no signs or symptoms of lung cancer). Patient denies illness that would prevent curative treatment for lung cancer if found. Verified smoking history, (current, 30.75 pack year). The shared decision making visit was done 09/27/18. Patient is agreeable for CT scan being scheduled.

## 2019-10-01 ENCOUNTER — Other Ambulatory Visit: Payer: Self-pay

## 2019-10-01 ENCOUNTER — Ambulatory Visit
Admission: RE | Admit: 2019-10-01 | Discharge: 2019-10-01 | Disposition: A | Discharge status: Home / Self Care | Payer: Medicare Other | Source: Ambulatory Visit | Attending: Oncology | Admitting: Oncology

## 2019-10-01 DIAGNOSIS — Z87891 Personal history of nicotine dependence: Secondary | ICD-10-CM | POA: Diagnosis present

## 2019-10-01 DIAGNOSIS — Z122 Encounter for screening for malignant neoplasm of respiratory organs: Secondary | ICD-10-CM | POA: Diagnosis present

## 2019-10-02 NOTE — Progress Notes (Signed)
Results

## 2019-10-03 ENCOUNTER — Telehealth: Payer: Self-pay | Admitting: *Deleted

## 2019-10-03 NOTE — Telephone Encounter (Signed)
Shawn- Please present the imaging at the tumor conference next week.  We will get the recommendations from the tumor conference. GB

## 2019-10-03 NOTE — Telephone Encounter (Signed)
Notified patient of LDCT lung cancer screening program results with recommendation for follow up of abnormal findings. Also notified of incidental findings noted below and is encouraged to discuss further with PCP who will receive a copy of this note and/or the CT report. Patient verbalizes understanding.   Specifically, will obtain recommendation from Pulmonary and hematology (referred for lung screening), and will contact patient with recommendations.  IMPRESSION: 1. New bilateral pulmonary nodules measure up to 8.5 mm in the right upper lobe. Findings may be infectious/inflammatory or malignant in etiology. By size, largest lesion is characterized as Lung-RADS 4B, suspicious. Additional imaging evaluation or consultation with Pulmonology or Thoracic Surgery recommended. Consider repeat low-dose chest CT without contrast in 3-4 weeks (please use the following order, "CT CHEST LCS NODULE FOLLOW-UP W/O CM"), if there is a trial of antibiotic therapy. These results will be called to the ordering clinician or representative by the Radiologist Assistant, and communication documented in the PACS or zVision Dashboard. 2. Bilateral adrenal adenomas. 3. Aortic atherosclerosis (ICD10-170.0). Coronary artery calcification. 4. Enlarged pulmonic trunk, indicative of pulmonary arterial hypertension. 5.  Emphysema (ICD10-J43.9).

## 2019-10-08 ENCOUNTER — Other Ambulatory Visit: Payer: Self-pay

## 2019-10-08 ENCOUNTER — Other Ambulatory Visit: Payer: Self-pay | Admitting: *Deleted

## 2019-10-08 ENCOUNTER — Encounter
Admission: RE | Admit: 2019-10-08 | Discharge: 2019-10-08 | Disposition: A | Discharge status: Home / Self Care | Payer: Medicare Other | Source: Ambulatory Visit | Attending: Physician Assistant | Admitting: Physician Assistant

## 2019-10-08 DIAGNOSIS — R0602 Shortness of breath: Secondary | ICD-10-CM | POA: Insufficient documentation

## 2019-10-08 LAB — NM MYOCAR MULTI W/SPECT W/WALL MOTION / EF
LV dias vol: 75 mL (ref 46–106)
LV sys vol: 45 mL
MPHR: 152 {beats}/min
Peak HR: 98 {beats}/min
Percent HR: 64 %
Rest HR: 81 {beats}/min
TID: 1.02

## 2019-10-08 MED ORDER — REGADENOSON 0.4 MG/5ML IV SOLN
0.4000 mg | Freq: Once | INTRAVENOUS | Status: AC
  Administered 2019-10-08: 0.4 mg via INTRAVENOUS

## 2019-10-08 MED ORDER — TECHNETIUM TC 99M TETROFOSMIN IV KIT
10.8600 | PACK | Freq: Once | INTRAVENOUS | Status: AC | PRN
  Administered 2019-10-08: 10.86 via INTRAVENOUS

## 2019-10-08 MED ORDER — TECHNETIUM TC 99M TETROFOSMIN IV KIT
31.8310 | PACK | Freq: Once | INTRAVENOUS | Status: AC | PRN
  Administered 2019-10-08: 31.831 via INTRAVENOUS

## 2019-10-09 ENCOUNTER — Telehealth: Payer: Self-pay | Admitting: Physician Assistant

## 2019-10-09 NOTE — Telephone Encounter (Signed)
Notes recorded by Rise Mu, PA-C on 10/08/2019 at 7:49 PM EST  Stress test is abnormal and intermediate risk.  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  She needs to talk with her nephrologist regarding renal function and long term plans/goal.  Please schedule her to be seen this week for further discussion of this stress test. Ideally, would recommend she see her primary cardiologist if possible.  She was also noted to have bilateral pulmonary nodules, which were also seen on prior CT chest in 09/2019. Per radiology recommendation, she needs follow up of these in 3-4 weeks time, dated from her original CT 10/01/2019. Please ensure she is following up with her PCP regarding these nodules.  Regarding her reduced EF, she is scheduled for an echo in early 10/2019, please see if this can be moved up.

## 2019-10-09 NOTE — Telephone Encounter (Signed)
Attempted to schedule sooner no ans no vm.  Added to waitlist for echo

## 2019-10-09 NOTE — Telephone Encounter (Signed)
I spoke with the patient regarding her results.  I have advised her of Thurmond Butts, PA's recommendations to follow up with Dr. Fletcher Anon this week to discuss a plan of care further.  She is agreeable with coming on 11/12 at 12:00 pm to see Dr. Fletcher Anon.   She was not aware of any pulmonary nodules being documented in the past.  I have advised her I will forward a copy of this test to her PCP.   She is also aware I will speak with scheduling to see if her echo can be done sooner.

## 2019-10-10 ENCOUNTER — Other Ambulatory Visit: Payer: Self-pay

## 2019-10-10 ENCOUNTER — Inpatient Hospital Stay: Payer: Medicare Other

## 2019-10-10 ENCOUNTER — Encounter: Payer: Self-pay | Admitting: Cardiovascular Disease

## 2019-10-10 ENCOUNTER — Ambulatory Visit (INDEPENDENT_AMBULATORY_CARE_PROVIDER_SITE_OTHER): Payer: Medicare Other | Admitting: Cardiovascular Disease

## 2019-10-10 ENCOUNTER — Telehealth: Payer: Self-pay | Admitting: Cardiovascular Disease

## 2019-10-10 VITALS — BP 140/70 | HR 83 | Temp 97.5°F | Ht 63.0 in | Wt 169.8 lb

## 2019-10-10 DIAGNOSIS — Z720 Tobacco use: Secondary | ICD-10-CM

## 2019-10-10 DIAGNOSIS — E782 Mixed hyperlipidemia: Secondary | ICD-10-CM

## 2019-10-10 DIAGNOSIS — I25118 Atherosclerotic heart disease of native coronary artery with other forms of angina pectoris: Secondary | ICD-10-CM | POA: Diagnosis not present

## 2019-10-10 DIAGNOSIS — I10 Essential (primary) hypertension: Secondary | ICD-10-CM

## 2019-10-10 MED ORDER — ROSUVASTATIN CALCIUM 40 MG PO TABS: 40.0000 mg | ORAL_TABLET | Freq: Every day | ORAL | 3 refills | Status: DC

## 2019-10-10 NOTE — Telephone Encounter (Signed)
Pharmacy calling to confirm statin change from atorvastatin to rosuvastatin .   Please call Caitlin Bass

## 2019-10-10 NOTE — Progress Notes (Signed)
Tumor Board Documentation  Caitlin Bass was presented by Army Chaco, RN at our Tumor Board on 10/10/2019, which included representatives from medical oncology, radiation oncology, pathology, radiology, surgical, navigation, internal medicine, pharmacy, palliative care, research.  Caitlin Bass currently presents as a current patient, for discussion with history of the following treatments: active survellience.  Additionally, we reviewed previous medical and familial history, history of present illness, and recent lab results along with all available histopathologic and imaging studies. The tumor board considered available treatment options and made the following recommendations: Active surveillance Repeat Non Contrast CT in 4 weeks, Pet as indicated  The following procedures/referrals were also placed: No orders of the defined types were placed in this encounter.   Clinical Trial Status: not discussed   Staging used: Not Applicable  National site-specific guidelines   were discussed with respect to the case.  Tumor board is a meeting of clinicians from various specialty areas who evaluate and discuss patients for whom a multidisciplinary approach is being considered. Final determinations in the plan of care are those of the provider(s). The responsibility for follow up of recommendations given during tumor board is that of the provider.   Today's extended care, comprehensive team conference, Caitlin Bass was not present for the discussion and was not examined.   Multidisciplinary Tumor Board is a multidisciplinary case peer review process.  Decisions discussed in the Multidisciplinary Tumor Board reflect the opinions of the specialists present at the conference without having examined the patient.  Ultimately, treatment and diagnostic decisions rest with the primary provider(s) and the patient.

## 2019-10-10 NOTE — Patient Instructions (Signed)
Medication Instructions:  Your physician has recommended you make the following change in your medication:  1) STOP Atorvastatin (Lipitor)  2) START Rosuvastatin (Crestor) 40mg  daily. An Rx has been sent to your pharmacy.  *If you need a refill on your cardiac medications before your next appointment, please call your pharmacy*  Lab Work: Your physician recommends that you return for a FASTING lipid profile and hepatic panel in 6 weeks.  Please have your fasting labs drawn at the [facility] medical mall. You don not need an appointment. Their hours are Mon-Fri 7:00am-6pm.  If you have labs (blood work) drawn today and your tests are completely normal, you will receive your results only by: Marland Kitchen MyChart Message (if you have MyChart) OR . A paper copy in the mail If you have any lab test that is abnormal or we need to change your treatment, we will call you to review the results.  Testing/Procedures: None ordered  Follow-Up: At [facility], you and your health needs are our priority.  As part of our continuing mission to provide you with exceptional heart care, we have created designated Provider Care Teams.  These Care Teams include your primary Cardiologist (physician) and Advanced Practice Providers (APPs -  Physician Assistants and Nurse Practitioners) who all work together to provide you with the care you need, when you need it.  Your next appointment:   6 months  The format for your next appointment:   In Person  Provider:    You may see Kathlyn Sacramento, MD or one of the following Advanced Practice Providers on your designated Care Team:    Murray Hodgkins, NP  Christell Faith, PA-C  Marrianne Mood, PA-C   Other Instructions N/A

## 2019-10-10 NOTE — Telephone Encounter (Addendum)
Returned the call to [employer] lmom on their automated message line. No option given to speak with someone. lmom confirming that Dr. Fletcher Anon did switch the patient's statin medication from Atorvastatin to Rosuvastatin 40mg  daily. The pharmacy is to contact the office if any additional questions.

## 2019-10-10 NOTE — Progress Notes (Signed)
Cardiology Office Note   Date:  10/10/2019   ID:  Caitlin Bass, DOB 08-07-1951, MRN [id number]  PCP:  Elisabeth Cara, NP  Cardiologist:   Kathlyn Sacramento, MD   Chief Complaint  Patient presents with  . other    Follow up [name]. Meds reviewed by the pt. verbally. Pt. c/o chest tightness and shortness of breath when walking.       History of Present Illness: Caitlin Bass is a 67 y.o. female who presents for a followup visit regarding coronary artery disease.  She had non-ST elevation myocardial infarction in May 2015. Echo showed nl LV fxn. Cardiac catheterization showed an occluded LCX with L-L collats and otherwise nonobstructive CAD. She was medically managed . 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  She has multiple other chronic conditions that include COPD, tobacco use, anemia of chronic disease, essential hypertension, hyperlipidemia and sleep apnea on CPAP. Echocardiogram in November 2019 showed hyperdynamic LV systolic function, severe left ventricular hypertrophy and trivial pericardial effusion. She was seen by Thurmond Butts last month for worsening exertional dyspnea.  No chest pain. She underwent a [employer] which showed evidence of prior inferior/inferolateral infarct with mild peri-infarct ischemia with ejection fraction of 44%.  She is scheduled to have an echocardiogram.  She reports that her shortness of breath is stable overall and she denies any recent chest pain.  She continues to smoke 7 to 10 cigarettes a day.  Past Medical History:  Diagnosis Date  . Anemia   . Anxiety   . Arthritis   . CKD (chronic kidney disease), stage IV ([place])   . Coronary artery disease    a. 03/2014 NSTEMI/Cath: LM nl, LAD 64m, 60d, D1 60, D2 nl, d3 40, LCX 100 w/ L->L collats, RCA non dom, min irregs, EF 55%-->Med Rx.  . DDD (degenerative disc disease)    a. s/p upper back surgery spring 2015.  . Diabetes mellitus without  complication ([place])   . Diverticulosis   . Fibromyalgia   . GERD (gastroesophageal reflux disease)   . Hiatal hernia   . Hyperlipidemia   . Hypertensive heart disease    a. 03/2015 Echo: 55-60%, mild conc LVH.  Marland Kitchen Syncope and collapse    a. Early 03/2014->did not seek medical attn.  . Tobacco abuse     Past Surgical History:  Procedure Laterality Date  . BREAST CYST ASPIRATION Right 2005   NEG  . CARDIAC CATHETERIZATION  06/2005   [facility]  . CARDIAC CATHETERIZATION  04/2014   [facility]  . COLONOSCOPY WITH PROPOFOL N/A 04/27/2016   Procedure: COLONOSCOPY WITH PROPOFOL;  Surgeon: Manya Silvas, MD;  Location: [facility] ENDOSCOPY;  Service: Endoscopy;  Laterality: N/A;  . CORONARY ANGIOPLASTY    . DILATATION & CURETTAGE/HYSTEROSCOPY WITH MYOSURE N/A 09/18/2015   Procedure: DILATATION & CURETTAGE/HYSTEROSCOPY WITH MYOSURE;  Surgeon: Boykin Nearing, MD;  Location: [facility] ORS;  Service: Gynecology;  Laterality: N/A;  . ESOPHAGOGASTRODUODENOSCOPY (EGD) WITH PROPOFOL N/A 04/27/2016   Procedure: ESOPHAGOGASTRODUODENOSCOPY (EGD) WITH PROPOFOL;  Surgeon: Manya Silvas, MD;  Location: [facility] ENDOSCOPY;  Service: Endoscopy;  Laterality: N/A;  . NECK SURGERY     Degenerative Disk Disease and removal of a spinal cyst  . TUBAL LIGATION       Current Outpatient Medications  Medication Sig Dispense Refill  . allopurinol (ZYLOPRIM) 100 MG tablet Take 100 mg by mouth 2 (two) times daily.    Marland Kitchen amLODipine (NORVASC) 5 MG  tablet Take 1 tablet (5 mg total) by mouth daily. 30 tablet 3  . aspirin 81 MG chewable tablet Chew 81 mg by mouth daily.    Marland Kitchen atorvastatin (LIPITOR) 40 MG tablet Take 40 mg by mouth daily at 6 PM.     . Calcium Carb-Cholecalciferol (CALCIUM 1000 + D PO) Take by mouth.    . carvedilol (COREG) 6.25 MG tablet Take 1 tablet (6.25 mg total) by mouth 2 (two) times daily. 180 tablet 3  . cetirizine (ZYRTEC) 10 MG tablet Take 10 mg by mouth daily.    . colchicine 0.6 MG tablet Take 1 tablet twice  daily for 3 days then 1 tablet daily.    . cromolyn (OPTICROM) 4 % ophthalmic solution 1 drop 4 (four) times daily.    . fluticasone (FLONASE) 50 MCG/ACT nasal spray 1 spray by Each Nare route daily.    . insulin glargine (LANTUS) 100 UNIT/ML injection Inject 40 Units into the skin every morning. 44 units at bedtime.    . Insulin Lispro, Human, (HUMALOG KWIKPEN ) Inject 8 Units into the skin every morning. 28 units at lunch and supper    . linaclotide (LINZESS) 72 MCG capsule Take 72 mcg by mouth daily.    Marland Kitchen lisinopril (PRINIVIL,ZESTRIL) 5 MG tablet Take 5 mg by mouth at bedtime.    . magnesium oxide (MAG-OX) 400 MG tablet Take 400 mg by mouth 2 (two) times daily.    . Omega-3 Fatty Acids (FISH OIL MAXIMUM STRENGTH) 1200 MG CAPS Take 1,000 mg by mouth 2 (two) times daily.   0  . pantoprazole (PROTONIX) 20 MG tablet   0  . triamcinolone cream (KENALOG) 0.1 % Apply 1 application topically 2 (two) times daily.    . traMADol (ULTRAM) 50 MG tablet Take 1 tablet (50 mg total) by mouth 2 (two) times daily. (Patient not taking: Reported on 10/10/2019) 10 tablet 0   No current facility-administered medications for this visit.     Allergies:   Aspirin and Hydrochlorothiazide    Social History:  The patient  reports that she has been smoking cigarettes. She has a 20.00 pack-year smoking history. She has never used smokeless tobacco. She reports that she does not drink alcohol or use drugs.   Family History:  The patient's family history includes Breast cancer in her sister; Heart attack in her mother; Heart disease in her mother; Hyperlipidemia in her mother; Hypertension in her mother.    ROS:  Please see the history of present illness.   Otherwise, review of systems are positive for none.   All other systems are reviewed and negative.    PHYSICAL EXAM: VS:  BP 140/70 (BP Location: Left Arm, Patient Position: Sitting, Cuff Size: Normal)   Pulse 83   Temp (!) 97.5 F (36.4 C)   Ht 5\' 3"  (1.6  m)   Wt 169 lb 12 oz (77 kg)   BMI 30.07 kg/m  , BMI Body mass index is 30.07 kg/m. GEN: Well nourished, well developed, in no acute distress  HEENT: normal  Neck: no JVD, carotid bruits, or masses Cardiac: RRR; no  rubs, or gallops,no edema . 2/6 SEM at the aortic area.  Respiratory:  clear to auscultation bilaterally, normal work of breathing GI: soft, nontender, nondistended, + BS MS: no deformity or atrophy  Skin: warm and dry, no rash Neuro:  Strength and sensation are intact Psych: euthymic mood, full affect   EKG:  EKG is ordered today. The ekg ordered today  demonstrates normal sinus rhythm with possible left atrial enlargement, lateral T wave changes suggestive of ischemia.  Recent Labs: 02/27/2019: Hemoglobin 12.4; Platelets 326    Lipid Panel    Component Value Date/Time   CHOL 291 (H) 07/23/2013 0429   TRIG 550 (H) 07/23/2013 0429   HDL 37 (L) 07/23/2013 0429   VLDL SEE COMMENT 07/23/2013 0429   LDLCALC SEE COMMENT 07/23/2013 0429      Wt Readings from Last 3 Encounters:  10/10/19 169 lb 12 oz (77 kg)  10/01/19 172 lb (78 kg)  09/20/19 172 lb 8 oz (78.2 kg)        ASSESSMENT AND PLAN:  1.  Coronary artery disease involving native coronary arteries with other forms of angina: The patient has exertional dyspnea but she reports that this has been stable overall.  She has enough medical reasons to explain her dyspnea including continued tobacco use and overall deconditioning.  She denies any chest pain.  Coronary ischemia cannot be completely excluded as an etiology.  Her stress test recently was abnormal but the findings are expected given her coronary anatomy of an occluded distal dominant left circumflex.  There was 60% distal LAD stenosis and the coronary arteries were overall heavily calcified.  Given that her symptoms are overall stable and the fact that she has advanced chronic kidney disease, I recommend continuing aggressive medical therapy for now.  2.  Essential hypertension: Blood pressure is  reasonably controlled now on amlodipine and metoprolol.  3. Hyperlipidemia: Currently on atorvastatin 40 mg once daily.  She reports that she got lipid profile checked recently and was not at target.  Most recent lipid profile in our system in 2014 showed severely elevated cholesterol.  I think we have to be more aggressive with her hyperlipidemia and thus I elected to switch from atorvastatin to rosuvastatin 40 mg daily.  Repeat lipid and liver profile in 6 weeks.  We might have to consider adding Zetia and also consider Vascepa if triglycerides continue to be elevated.  4. Chronic kidney disease: Followed by nephrology.  She was seen by Dr. Rockwell Germany yesterday and her creatinine was 3.5.  5. Tobacco use:  I again discussed with her the importance of smoking cessation.  6.  Pulmonary nodules: Follow-up CT scan was recommended.   Disposition:   FU with me in 6 months  Signed,  Kathlyn Sacramento, MD  10/10/2019 11:38 AM    [place]

## 2019-10-23 ENCOUNTER — Telehealth: Payer: Self-pay | Admitting: *Deleted

## 2019-10-23 DIAGNOSIS — Z87891 Personal history of nicotine dependence: Secondary | ICD-10-CM

## 2019-10-23 DIAGNOSIS — R918 Other nonspecific abnormal finding of lung field: Secondary | ICD-10-CM

## 2019-10-23 DIAGNOSIS — Z122 Encounter for screening for malignant neoplasm of respiratory organs: Secondary | ICD-10-CM

## 2019-10-23 NOTE — Telephone Encounter (Signed)
Contacted and scheduled for lung screening follow up imaging.

## 2019-10-30 ENCOUNTER — Other Ambulatory Visit: Payer: Medicare Other

## 2019-11-01 ENCOUNTER — Ambulatory Visit
Admission: RE | Admit: 2019-11-01 | Discharge: 2019-11-01 | Disposition: A | Discharge status: Home / Self Care | Payer: Medicare Other | Source: Ambulatory Visit | Attending: Oncology | Admitting: Oncology

## 2019-11-01 ENCOUNTER — Other Ambulatory Visit: Payer: Self-pay

## 2019-11-01 DIAGNOSIS — Z122 Encounter for screening for malignant neoplasm of respiratory organs: Secondary | ICD-10-CM | POA: Insufficient documentation

## 2019-11-01 DIAGNOSIS — Z87891 Personal history of nicotine dependence: Secondary | ICD-10-CM | POA: Insufficient documentation

## 2019-11-01 DIAGNOSIS — R918 Other nonspecific abnormal finding of lung field: Secondary | ICD-10-CM | POA: Insufficient documentation

## 2019-11-05 ENCOUNTER — Telehealth: Payer: Self-pay | Admitting: *Deleted

## 2019-11-05 NOTE — Telephone Encounter (Signed)
Notified patient of LDCT lung cancer screening program results with recommendation for 6 month follow up imaging. Also notified of incidental findings noted below and is encouraged to discuss further with PCP who will receive a copy of this note and/or the CT report. Patient verbalizes understanding.   IMPRESSION: 1. Interval decrease in bilateral pulmonary nodules identified as new on the prior study. Dominant nodule now measures 7.1 mm. Lung-RADS 3, probably benign findings. Short-term follow-up in 6 months is recommended with repeat low-dose chest CT without contrast (please use the following order, "CT CHEST LCS NODULE FOLLOW-UP W/O CM"). 2. New small patchy area of consolidative opacity in the left lung base, likely infectious/inflammatory. 3. Stable bilateral adrenal adenomas. 4.  Emphysema. (ICD10-J43.9) 5.  Aortic Atherosclerois (ICD10-170.0)

## 2019-11-08 ENCOUNTER — Other Ambulatory Visit
Admission: RE | Admit: 2019-11-08 | Discharge: 2019-11-08 | Disposition: A | Discharge status: Home / Self Care | Payer: Medicare Other | Source: Ambulatory Visit | Attending: Cardiovascular Disease | Admitting: Cardiovascular Disease

## 2019-11-08 DIAGNOSIS — E782 Mixed hyperlipidemia: Secondary | ICD-10-CM | POA: Diagnosis present

## 2019-11-08 LAB — HEPATIC FUNCTION PANEL
ALT: 19 U/L (ref 0–44)
AST: 16 U/L (ref 15–41)
Albumin: 3.8 g/dL (ref 3.5–5.0)
Alkaline Phosphatase: 94 U/L (ref 38–126)
Bilirubin, Direct: <0.1 mg/dL (ref 0.0–0.2)
Total Bilirubin: 0.4 mg/dL (ref 0.3–1.2)
Total Protein: 6.9 g/dL (ref 6.5–8.1)

## 2019-11-08 LAB — LIPID PANEL
Cholesterol: 142 mg/dL (ref 0–200)
HDL: 33 mg/dL — ABNORMAL LOW (ref >40)
LDL Cholesterol: UNDETERMINED mg/dL (ref 0–99)
Total CHOL/HDL Ratio: 4.3 RATIO
Triglycerides: 437 mg/dL — ABNORMAL HIGH (ref <150)
VLDL: UNDETERMINED mg/dL (ref 0–40)

## 2019-11-08 LAB — LDL CHOLESTEROL, DIRECT: Direct LDL: 48.6 mg/dL (ref 0–99)

## 2019-11-18 ENCOUNTER — Ambulatory Visit (INDEPENDENT_AMBULATORY_CARE_PROVIDER_SITE_OTHER): Payer: Medicare Other

## 2019-11-18 ENCOUNTER — Other Ambulatory Visit: Payer: Self-pay

## 2019-11-18 DIAGNOSIS — R0602 Shortness of breath: Secondary | ICD-10-CM

## 2019-11-19 ENCOUNTER — Encounter: Payer: Self-pay | Admitting: Nurse Practitioner

## 2019-11-19 ENCOUNTER — Ambulatory Visit (INDEPENDENT_AMBULATORY_CARE_PROVIDER_SITE_OTHER): Payer: Medicare Other | Admitting: Nurse Practitioner

## 2019-11-19 ENCOUNTER — Telehealth: Payer: Self-pay | Admitting: Nurse Practitioner

## 2019-11-19 VITALS — BP 164/78 | HR 95 | Ht 63.0 in | Wt 170.5 lb

## 2019-11-19 DIAGNOSIS — R0600 Dyspnea, unspecified: Secondary | ICD-10-CM | POA: Diagnosis not present

## 2019-11-19 DIAGNOSIS — I25118 Atherosclerotic heart disease of native coronary artery with other forms of angina pectoris: Secondary | ICD-10-CM

## 2019-11-19 DIAGNOSIS — I050 Rheumatic mitral stenosis: Secondary | ICD-10-CM

## 2019-11-19 DIAGNOSIS — I10 Essential (primary) hypertension: Secondary | ICD-10-CM | POA: Diagnosis not present

## 2019-11-19 DIAGNOSIS — R0609 Other forms of dyspnea: Secondary | ICD-10-CM

## 2019-11-19 DIAGNOSIS — Z720 Tobacco use: Secondary | ICD-10-CM

## 2019-11-19 DIAGNOSIS — N184 Chronic kidney disease, stage 4 (severe): Secondary | ICD-10-CM

## 2019-11-19 MED ORDER — CARVEDILOL 12.5 MG PO TABS: 12.5000 mg | ORAL_TABLET | Freq: Two times a day (BID) | ORAL | 0 refills | Status: AC

## 2019-11-19 MED ORDER — CARVEDILOL 12.5 MG PO TABS: 12.5000 mg | ORAL_TABLET | Freq: Two times a day (BID) | ORAL | 3 refills | Status: DC

## 2019-11-19 NOTE — Telephone Encounter (Signed)
Rx sent to [facility] as requested

## 2019-11-19 NOTE — Patient Instructions (Addendum)
Medication Instructions:  1- INCREASE Coreg Take 1 tablet (12.5 mg total) by mouth 2 (two) times daily *If you need a refill on your cardiac medications before your next appointment, please call your pharmacy*  Lab Work: Your physician recommends that you return for lab work in: 12/05/19 at [facility]. ([facility], [facility])No appt is needed. Hours are M-F 7AM- 6 PM.  If you have labs (blood work) drawn today and your tests are completely normal, you will receive your results only by: Marland Kitchen MyChart Message (if you have MyChart) OR . A paper copy in the mail If you have any lab test that is abnormal or we need to change your treatment, we will call you to review the results.  Testing/Procedures: 1- Testing/Procedures: TEE  Date ______ Time_______(Please arrive 1 hr prior to scheduled time.  Location: [place]  DIET: Nothing to eat or drink after midnight except a sip of water with medications (see medication instructions below)   Medication Instructions: Take 1/2 dose long acting insulin the evening prior. Hold short acting insulin the morning of.   Labs:______1/7/21 @ [facility] mall____________ Covid Testing:_CV19 Pre admit testing DRIVE THRU  Please report to the PAT testing site [facility] on _______1/7/21__ date ______12:30-2:30PM_____ time for your DRIVE THRU covid testing that is required prior to your procedure.  Following covid testing, please remain in quarantine. If you must be around others, please wash hands, avoid touching face and wear your mask.     You must have a responsible person to drive you home and stay in the waiting area during your procedure. Failure to do so could result in cancellation.   Bring your insurance cards.   *Special Note: Every effort is made to have your procedure done on time. Occasionally there are emergencies that occur at the hospital that may cause delays. Please be patient if a delay does occur.    TEE Your physician has  requested that you have a TEE/Cardioversion. During a TEE, sound waves are used to create images of your heart. It provides your doctor with information about the size and shape of your heart and how well your heart's chambers and valves are working. In this test, a transducer is attached to the end of a flexible tube that is guided down you throat and into your esophagus (the tube leading from your mouth to your stomach) to get a more detailed image of your heart. Once the TEE has determined that a blood clot is not present, the cardioversion begins. Electrical Cardioversion uses a jolt of electricity to your heart either through paddles or wired patches attached to your chest. This is a controlled, usually prescheduled, procedure. This procedure is done at the hospital and you are not awake during the procedure. You usually go home the day of the procedure. Please see the instruction sheet given to you today for more information.   Follow-Up: At [facility], you and your health needs are our priority.  As part of our continuing mission to provide you with exceptional heart care, we have created designated Provider Care Teams.  These Care Teams include your primary Cardiologist (physician) and Advanced Practice Providers (APPs -  Physician Assistants and Nurse Practitioners) who all work together to provide you with the care you need, when you need it.  Your next appointment:   1 month(s)  The format for your next appointment:   In Person  Provider:    Please see Kathlyn Sacramento, MD.

## 2019-11-19 NOTE — Addendum Note (Signed)
Addended by: Raelene Bott, Lowen Mansouri L on: 11/19/2019 02:51 PM   Modules accepted: Orders

## 2019-11-19 NOTE — Telephone Encounter (Signed)
Dose change with coreg today please send new rx via fax   [phone]

## 2019-11-19 NOTE — Progress Notes (Signed)
Office Visit    Patient Name: Caitlin Bass Date of Encounter: 11/19/2019  Primary Care Provider:  Elisabeth Cara, NP Primary Cardiologist:  Kathlyn Sacramento, MD  Chief Complaint    68 year old female with a history of CAD, hypertension, hyperlipidemia, diabetes, ongoing tobacco abuse, dyspnea on exertion, anemia, MGUS, and stage IV chronic kidney disease, who presents for follow-up related to dyspnea and abnormal echocardiogram showing moderate to severe mitral stenosis.  Past Medical History    Past Medical History:  Diagnosis Date  . Anemia   . Anxiety   . Arthritis   . CKD (chronic kidney disease), stage IV ([place])   . Coronary artery disease    a. 03/2014 NSTEMI/Cath: LM nl, LAD 72m, 60d, D1 60, D2 nl, d3 40, LCX 100 w/ L->L collats, RCA non dom, min irregs, EF 55%-->Med Rx; b. 09/2019 MV: EF 30-44%, large basal inf, basal inflat, mid inf, mid inflat, apical defect - partially reversible.  . DDD (degenerative disc disease)    a. s/p upper back surgery spring 2015.  . Diabetes mellitus without complication ([place])   . Diverticulosis   . Fibromyalgia   . GERD (gastroesophageal reflux disease)   . Hiatal hernia   . Hyperlipidemia   . Hypertensive heart disease    a. 03/2015 Echo: 55-60%, mild conc LVH; b. 10/2019 Echo: EF 65-70%, mod LVH.  . Mitral stenosis    a. 10/2019 Echo: EF 65-70%, mod LVH. No rwma. Mildly dil LA. MV Ca2+ w/ mod-sev MS (mean grad 40mmHg, Valve area 1.1-1.2). Mild to mod AoV sclerosis w/o stenosis.  . Syncope and collapse    a. Early 03/2014->did not seek medical attn.  . Tobacco abuse    Past Surgical History:  Procedure Laterality Date  . BREAST CYST ASPIRATION Right 2005   NEG  . CARDIAC CATHETERIZATION  06/2005   [facility]  . CARDIAC CATHETERIZATION  04/2014   [facility]  . COLONOSCOPY WITH PROPOFOL N/A 04/27/2016   Procedure: COLONOSCOPY WITH PROPOFOL;  Surgeon: Manya Silvas, MD;  [facility] ENDOSCOPY;  Service: Endoscopy;  Laterality: N/A;  .  CORONARY ANGIOPLASTY    . DILATATION & CURETTAGE/HYSTEROSCOPY WITH MYOSURE N/A 09/18/2015   Procedure: DILATATION & CURETTAGE/HYSTEROSCOPY WITH MYOSURE;  Surgeon: Boykin Nearing, MD;  Location: [facility] ORS;  Service: Gynecology;  Laterality: N/A;  . ESOPHAGOGASTRODUODENOSCOPY (EGD) WITH PROPOFOL N/A 04/27/2016   Procedure: ESOPHAGOGASTRODUODENOSCOPY (EGD) WITH PROPOFOL;  Surgeon: Manya Silvas, MD;  Location: [facility] ENDOSCOPY;  Service: Endoscopy;  Laterality: N/A;  . NECK SURGERY     Degenerative Disk Disease and removal of a spinal cyst  . TUBAL LIGATION      Allergies  Allergies  Allergen Reactions  . Aspirin Other (See Comments)    "pt. has a history of ulcers" Stomach hurts  . Hydrochlorothiazide Itching    History of Present Illness    68 year old female with the above past medical history including coronary artery disease, hypertension, hyperlipidemia, diabetes, tobacco abuse, dyspnea on exertion, anemia, MGUS, and stage IV chronic kidney disease.  She previously suffered a non-STEMI in May 2015 with catheterization at that time showing an occluded left circumflex with left to left collaterals and otherwise nonobstructive disease.  Echo showed normal LV function.  She has been medically managed ever since.  Prior echo in November 2019 showed hyperdynamic LV systolic function with severe LVH and trivial pericardial effusion.  More recently, she was seen in October in the setting of progressive dyspnea without chest pain.  Stress testing  was undertaken and showed prior inferior/inferolateral infarct with mild peri-infarct ischemia and an EF of 44%.  Because of stage IV chronic kidney disease, continued medical therapy was recommended when she was seen by Dr. Fletcher Anon on November 12.  That visit was followed by an echocardiogram to reevaluate LV function and this was just performed yesterday, showing an EF of 65 to 70% with moderate LVH and newly noted moderate to severe mitral stenosis  with a mean gradient of 12 mmHg and valve area of 1.1-1.2.  Today, she notes ongoing dyspnea on exertion.  She thinks she can walk about 25 yards prior to having to stop and rest related to dyspnea.  She says she used to walk a lot but because of progressive dyspnea, activity has dropped significantly and with this, she has developed some leg weakness and unsteadiness.  She does not experience chest pain and also denies palpitations, PND, orthopnea, dizziness, syncope, edema, or early satiety.  She says that she is cut back on cigarette some further, currently smoking about 3 cigarettes a day.  She is interested in further evaluation for her monitor severe mitral stenosis however is not sure that she wants to undergo Covid testing prior to TEE.  Home Medications    Prior to Admission medications   Medication Sig Start Date End Date Taking? Authorizing Provider  allopurinol (ZYLOPRIM) 100 MG tablet Take 100 mg by mouth 2 (two) times daily.    [provider]  amLODipine (NORVASC) 5 MG tablet Take 1 tablet (5 mg total) by mouth daily. 10/26/15   Wellington Hampshire, MD  aspirin 81 MG chewable tablet Chew 81 mg by mouth daily.    [provider]  Calcium Carb-Cholecalciferol (CALCIUM 1000 + D PO) Take by mouth.    [provider]  carvedilol (COREG) 6.25 MG tablet Take 1 tablet (6.25 mg total) by mouth 2 (two) times daily. 09/23/19   Dunn, Areta Haber, PA-C  cetirizine (ZYRTEC) 10 MG tablet Take 10 mg by mouth daily.    [provider]  colchicine 0.6 MG tablet Take 1 tablet twice daily for 3 days then 1 tablet daily. 08/14/15   [provider]  cromolyn (OPTICROM) 4 % ophthalmic solution 1 drop 4 (four) times daily.    [provider]  fluticasone (FLONASE) 50 MCG/ACT nasal spray 1 spray by Each Nare route daily.    [provider]  insulin glargine (LANTUS) 100 UNIT/ML injection Inject 40 Units into the skin every morning. 44 units at bedtime.     [provider]  Insulin Lispro, Human, (HUMALOG KWIKPEN ) Inject 8 Units into the skin every morning. 28 units at lunch and supper    [provider]  linaclotide (LINZESS) 72 MCG capsule Take 72 mcg by mouth daily. 01/03/17 10/10/19  [provider]  lisinopril (PRINIVIL,ZESTRIL) 5 MG tablet Take 5 mg by mouth at bedtime.    [provider]  magnesium oxide (MAG-OX) 400 MG tablet Take 400 mg by mouth 2 (two) times daily.    [provider]  Omega-3 Fatty Acids (FISH OIL MAXIMUM STRENGTH) 1200 MG CAPS Take 1,000 mg by mouth 2 (two) times daily.  02/10/16   [provider]  pantoprazole (PROTONIX) 20 MG tablet  07/25/16   [provider]  rosuvastatin (CRESTOR) 40 MG tablet Take 1 tablet (40 mg total) by mouth daily. 10/10/19 01/08/20  Wellington Hampshire, MD  traMADol (ULTRAM) 50 MG tablet Take 1 tablet (50 mg total) by  mouth 2 (two) times daily. Patient not taking: Reported on 10/10/2019 06/04/17   Menshew, Dannielle Karvonen, PA-C  triamcinolone cream (KENALOG) 0.1 % Apply 1 application topically 2 (two) times daily.    [provider]    Review of Systems    Ongoing dyspnea on exertion as outlined above.  She denies chest pain, palpitations, PND, orthopnea, dizziness, syncope, edema, or early satiety.  All other systems reviewed and are otherwise negative except as noted above.  Physical Exam    VS:  BP (!) 164/78 (BP Location: Left Arm, Patient Position: Sitting, Cuff Size: Normal)   Pulse 95   Ht 5\' 3"  (1.6 m)   Wt 170 lb 8 oz (77.3 kg)   SpO2 96%   BMI 30.20 kg/m  , BMI Body mass index is 30.2 kg/m. GEN: Well nourished, well developed, in no acute distress. HEENT: normal. Neck: Supple, no JVD, carotid bruits, or masses. Cardiac: RRR, 2/6 systolic murmur noted throughout.  I do not appreciate a diastolic murmur/rumble.  No rubs, or gallops. No clubbing, cyanosis, edema.  Radials/PT 2+ and equal bilaterally.    Respiratory:  Respirations regular and unlabored, clear to auscultation bilaterally. GI: Obese, soft, nontender, nondistended, BS + x 4. MS: no deformity or atrophy. Skin: warm and dry, no rash. Neuro:  Strength and sensation are intact. Psych: Normal affect.  Accessory Clinical Findings    ECG personally reviewed by me today -regular sinus rhythm, 95, left axis deviation, septal infarct - no acute changes.  Lab Results  Component Value Date   WBC 7.2 02/27/2019   HGB 12.4 02/27/2019   HCT 38.6 02/27/2019   MCV 90.8 02/27/2019   PLT 326 02/27/2019   Lab Results  Component Value Date   CREATININE 2.40 (H) 05/02/2018   BUN 38 (H) 05/02/2018   NA 137 05/02/2018   K 4.4 05/02/2018   CL 109 05/02/2018   CO2 19 (L) 05/02/2018   Lab Results  Component Value Date   ALT 19 11/08/2019   AST 16 11/08/2019   ALKPHOS 94 11/08/2019   BILITOT 0.4 11/08/2019   Lab Results  Component Value Date   CHOL 142 11/08/2019   HDL 33 (L) 11/08/2019   LDLCALC UNABLE TO CALCULATE IF TRIGLYCERIDE OVER 400 mg/dL 11/08/2019   LDLDIRECT 48.6 11/08/2019   TRIG 437 (H) 11/08/2019   CHOLHDL 4.3 11/08/2019    Lab Results  Component Value Date   HGBA1C 9.1 (H) 07/22/2013    Assessment & Plan    1.  Dyspnea on exertion: This is been progressive over the past month or so with recent intermediate risk stress testing suggesting inferior and inferolateral infarct with mild peri-infarct ischemia, followed by echocardiogram yesterday which shows normal LV function with moderate to severe mitral stenosis.  Patient also has a long history of tobacco abuse and continues to smoke 3 cigarettes a day.  Clearly, dyspnea may be multifactorial.  With regards to coronary disease, she has a known occlusion of the left circumflex with otherwise nonobstructive disease by catheterization just over 5 years ago.  We discussed pursuing further evaluation of mitral stenosis with transesophageal echocardiogram.  Initially,  patient was interested in scheduling however once we notified her that she would need COVID-19 testing prior to proceeding, she balked because of a prior history of a bee sting in her nostril and being told by an ENT in the past to never allow anyone to probe her nose.  She will let us know if she changes  her mind.  In the meantime, she is euvolemic on examination today.  Her blood pressure is elevated I am going to titrate her carvedilol to 12.5 mg twice daily.  2.  Coronary artery disease: Known circumflex occlusion by catheterization 2015 with recent intermediate risk stress test showing inferior and inferolateral infarct with mild peri-infarct ischemia.  Patient has not had chest pain.  Pursuit of catheterization limited by stage IV chronic kidney disease with a creatinine of 3.5.  We will continue medical therapy and consider work-up of mitral stenosis if she becomes willing to proceed with TEE.  If she opts for TEE and this shows severe mitral stenosis, we would likely have to undertake high risk catheterization with limited contrast.  She is aware of this.  She remains on aspirin, beta-blocker, ACE inhibitor, statin, and calcium channel blocker therapy.  3.  Moderate to severe mitral stenosis: Noted on echo yesterday.  Certainly could be playing a role in her progressive dyspnea.  As above, we discussed pursuing TEE however, she walked once she was told that she would need COVID-19 testing.  She will reconsider.  4.  Essential hypertension: Blood pressure elevated today.  Titrating carvedilol to 12.5 mg twice daily.  She remains on moderate dose amlodipine and low-dose ACE inhibitor as well.  5.  Stage IV chronic kidney disease: Followed by nephrology with creatinine of 3.5 in November.  6.  Hyperlipidemia/hypertriglyceridemia: She remains on high potency statin with recent LDL of 48.6.  Encouraged to continue over-the-counter fish oil for triglycerides though may need to strongly consider Vascepa  in the future.  7.  Pulmonary nodules: Followed by primary care with recent CT showing a decrease in bilateral pulmonary nodules.  Radiology recommended 79-month follow-up with low-dose chest CT without contrast.  8.  Tob Abuse:  Complete cessation advised.  Still smoking three cigarettes/day and says that she doesn't think that she'll be able to stop completely.  9.  Disposition: Patient to contact us if she decides on pursuing TEE.  Otherwise, follow-up in 1 month.  Murray Hodgkins, NP 11/19/2019, 11:57 AM

## 2019-11-25 ENCOUNTER — Other Ambulatory Visit: Payer: Self-pay | Admitting: Nurse Practitioner

## 2019-11-25 ENCOUNTER — Telehealth: Payer: Self-pay

## 2019-11-25 NOTE — Telephone Encounter (Signed)
-----   Message from Theora Gianotti, NP sent at 11/20/2019  9:05 AM EST ----- As of the time she left the office, she said she'd think about it.  Jasdeep Kepner - do you need to cancel? ----- Message ----- From: Wellington Hampshire, MD Sent: 11/20/2019   8:58 AM EST To: Theora Gianotti, NP  The TEE is still scheduled on January 11.  Is she having it done or no?  Thanks.  ----- Message ----- From: Theora Gianotti, NP Sent: 11/19/2019  12:17 PM EST To: Wellington Hampshire, MD

## 2019-11-25 NOTE — Addendum Note (Signed)
Addended by: Murray Hodgkins R on: 11/25/2019 10:59 AM   Modules accepted: Orders, SmartSet

## 2019-11-25 NOTE — Telephone Encounter (Signed)
I called patient to confirm. She is wanting to go ahead with procedure. It is scheduled but I dont see any orders yet. She is going for covid testing 1/7.  Thanks, Medina Degraffenreid  ===View-only below this line=== ----- Message ----- From: Theora Gianotti, NP Sent: 11/20/2019   9:05 AM EST To: Wellington Hampshire, MD, Alroy Dust.Pope Brunty, RN  As of the time she left the office, she said she'd think about it.  Braniyah Besse - do you need to cancel? ----- Message ----- From: Wellington Hampshire, MD Sent: 11/20/2019   8:58 AM EST To: Theora Gianotti, NP  The TEE is still scheduled on January 11.  Is she having it done or no?  Thanks.  ----- Message ----- From: Theora Gianotti, NP Sent: 11/19/2019  12:17 PM EST To: Wellington Hampshire, MD

## 2019-11-25 NOTE — Telephone Encounter (Signed)
-----   Message from Theora Gianotti, NP sent at 11/20/2019  9:05 AM EST ----- As of the time she left the office, she said she'd think about it.  Maverick Dieudonne - do you need to cancel? ----- Message ----- From: Wellington Hampshire, MD Sent: 11/20/2019   8:58 AM EST To: Theora Gianotti, NP  The TEE is still scheduled on January 11.  Is she having it done or no?  Thanks.  ----- Message ----- From: Theora Gianotti, NP Sent: 11/19/2019  12:17 PM EST To: Wellington Hampshire, MD

## 2019-12-05 ENCOUNTER — Other Ambulatory Visit: Admission: RE | Admit: 2019-12-05 | Payer: Medicare Other | Source: Ambulatory Visit

## 2019-12-09 ENCOUNTER — Ambulatory Visit
Admission: RE | Admit: 2019-12-09 | Discharge: 2019-12-09 | Disposition: A | Discharge status: Home / Self Care | Payer: Medicare Other | Source: Ambulatory Visit | Attending: Family Medicine | Admitting: Family Medicine

## 2019-12-09 ENCOUNTER — Ambulatory Visit: Admission: RE | Admit: 2019-12-09 | Payer: Medicare Other | Source: Home / Self Care | Admitting: Cardiovascular Disease

## 2019-12-09 ENCOUNTER — Encounter: Admission: RE | Payer: Medicare Other | Source: Home / Self Care

## 2019-12-09 DIAGNOSIS — Z1231 Encounter for screening mammogram for malignant neoplasm of breast: Secondary | ICD-10-CM | POA: Diagnosis present

## 2019-12-09 SURGERY — ECHOCARDIOGRAM, TRANSESOPHAGEAL
Anesthesia: Moderate Sedation

## 2019-12-10 ENCOUNTER — Inpatient Hospital Stay: Admission: RE | Admit: 2019-12-10 | Payer: Medicare Other | Source: Ambulatory Visit

## 2019-12-26 ENCOUNTER — Other Ambulatory Visit: Payer: Self-pay

## 2019-12-26 ENCOUNTER — Ambulatory Visit: Payer: Medicare Other | Admitting: Cardiovascular Disease

## 2019-12-26 ENCOUNTER — Emergency Department
Admission: EM | Admit: 2019-12-26 | Discharge: 2019-12-27 | Disposition: A | Discharge status: Home / Self Care | Payer: Medicare Other | Attending: Emergency Medicine | Admitting: Emergency Medicine

## 2019-12-26 DIAGNOSIS — E1122 Type 2 diabetes mellitus with diabetic chronic kidney disease: Secondary | ICD-10-CM | POA: Diagnosis not present

## 2019-12-26 DIAGNOSIS — I252 Old myocardial infarction: Secondary | ICD-10-CM | POA: Diagnosis not present

## 2019-12-26 DIAGNOSIS — M797 Fibromyalgia: Secondary | ICD-10-CM | POA: Insufficient documentation

## 2019-12-26 DIAGNOSIS — Z20822 Contact with and (suspected) exposure to covid-19: Secondary | ICD-10-CM | POA: Insufficient documentation

## 2019-12-26 DIAGNOSIS — I130 Hypertensive heart and chronic kidney disease with heart failure and stage 1 through stage 4 chronic kidney disease, or unspecified chronic kidney disease: Secondary | ICD-10-CM | POA: Insufficient documentation

## 2019-12-26 DIAGNOSIS — N184 Chronic kidney disease, stage 4 (severe): Secondary | ICD-10-CM | POA: Diagnosis not present

## 2019-12-26 DIAGNOSIS — I509 Heart failure, unspecified: Secondary | ICD-10-CM | POA: Insufficient documentation

## 2019-12-26 DIAGNOSIS — E875 Hyperkalemia: Secondary | ICD-10-CM | POA: Diagnosis not present

## 2019-12-26 DIAGNOSIS — Z9861 Coronary angioplasty status: Secondary | ICD-10-CM | POA: Diagnosis not present

## 2019-12-26 DIAGNOSIS — I2510 Atherosclerotic heart disease of native coronary artery without angina pectoris: Secondary | ICD-10-CM | POA: Diagnosis not present

## 2019-12-26 DIAGNOSIS — Z886 Allergy status to analgesic agent status: Secondary | ICD-10-CM | POA: Insufficient documentation

## 2019-12-26 DIAGNOSIS — E785 Hyperlipidemia, unspecified: Secondary | ICD-10-CM | POA: Diagnosis not present

## 2019-12-26 DIAGNOSIS — Z794 Long term (current) use of insulin: Secondary | ICD-10-CM | POA: Diagnosis not present

## 2019-12-26 DIAGNOSIS — F17210 Nicotine dependence, cigarettes, uncomplicated: Secondary | ICD-10-CM | POA: Diagnosis not present

## 2019-12-26 DIAGNOSIS — Z79899 Other long term (current) drug therapy: Secondary | ICD-10-CM | POA: Diagnosis not present

## 2019-12-26 LAB — BASIC METABOLIC PANEL
Anion gap: 8 (ref 5–15)
BUN: 43 mg/dL — ABNORMAL HIGH (ref 8–23)
CO2: 19 mmol/L — ABNORMAL LOW (ref 22–32)
Calcium: 8.4 mg/dL — ABNORMAL LOW (ref 8.9–10.3)
Chloride: 109 mmol/L (ref 98–111)
Creatinine, Ser: 4 mg/dL — ABNORMAL HIGH (ref 0.44–1.00)
GFR calc Af Amer: 13 mL/min — ABNORMAL LOW (ref >60)
GFR calc non Af Amer: 11 mL/min — ABNORMAL LOW (ref >60)
Glucose, Bld: 210 mg/dL — ABNORMAL HIGH (ref 70–99)
Potassium: 5.9 mmol/L — ABNORMAL HIGH (ref 3.5–5.1)
Sodium: 136 mmol/L (ref 135–145)

## 2019-12-26 LAB — TROPONIN I (HIGH SENSITIVITY): Troponin I (High Sensitivity): 10 ng/L (ref <18)

## 2019-12-26 LAB — CBC
HCT: 29.7 % — ABNORMAL LOW (ref 36.0–46.0)
Hemoglobin: 9.6 g/dL — ABNORMAL LOW (ref 12.0–15.0)
MCH: 29 pg (ref 26.0–34.0)
MCHC: 32.3 g/dL (ref 30.0–36.0)
MCV: 89.7 fL (ref 80.0–100.0)
Platelets: 313 10*3/uL (ref 150–400)
RBC: 3.31 MIL/uL — ABNORMAL LOW (ref 3.87–5.11)
RDW: 15.7 % — ABNORMAL HIGH (ref 11.5–15.5)
WBC: 6.2 10*3/uL (ref 4.0–10.5)
nRBC: 0 % (ref 0.0–0.2)

## 2019-12-26 MED ORDER — SODIUM CHLORIDE 0.9% FLUSH
3.0000 mL | Freq: Once | INTRAVENOUS | Status: AC
  Administered 2019-12-26: 3 mL via INTRAVENOUS

## 2019-12-26 MED ORDER — SODIUM CHLORIDE 0.9 % IV BOLUS
1000.0000 mL | Freq: Once | INTRAVENOUS | Status: AC
  Administered 2019-12-26: 1000 mL via INTRAVENOUS

## 2019-12-26 NOTE — ED Provider Notes (Signed)
[facility] Emergency Department Provider Note  ____________________________________________   First MD Initiated Contact with Patient 12/26/19 2352     (approximate)  I have reviewed the triage vital signs and the nursing notes.   HISTORY  Chief Complaint elevated potassium    HPI Caitlin Bass is a 69 y.o. female with stage IV CKD, diabetes who comes in for elevated potassium.  Patient is followed by [facility] nephrology.  Patient had an office visit today.  Patient has had a history of CKD and elevated potassium.  Currently she is holding her lisinopril, NSAIDs.  They were discussing starting Veltassa to help decrease potassium.  On 11/11 her potassium was 5.3 and her creatinine was 3.5.  On 1/25 her potassium was 6.4 and creatinine was 4.36.  However today her potassium was 6.8 with a creatinine of 4.08 so patient was sent in to the hospital.  Patient denies any symptoms at this time and feels at her baseline self.  She states that she has a cardiology appointment tomorrow to evaluate her mitral stenosis.  She also states that she was plan to go into nephrology clinic to get 2 doses of veltassa while she is working on finding a pharmacy that carries it.          Past Medical History:  Diagnosis Date  . Anemia   . Anxiety   . Arthritis   . CKD (chronic kidney disease), stage IV ([place])   . Coronary artery disease    a. 03/2014 NSTEMI/Cath: LM nl, LAD 35m, 60d, D1 60, D2 nl, d3 40, LCX 100 w/ L->L collats, RCA non dom, min irregs, EF 55%-->Med Rx; b. 09/2019 MV: EF 30-44%, large basal inf, basal inflat, mid inf, mid inflat, apical defect - partially reversible.  . DDD (degenerative disc disease)    a. s/p upper back surgery spring 2015.  . Diabetes mellitus without complication ([place])   . Diverticulosis   . Fibromyalgia   . GERD (gastroesophageal reflux disease)   . Hiatal hernia   . Hyperlipidemia   . Hypertensive heart disease    a. 03/2015 Echo:  55-60%, mild conc LVH; b. 10/2019 Echo: EF 65-70%, mod LVH.  . Mitral stenosis    a. 10/2019 Echo: EF 65-70%, mod LVH. No rwma. Mildly dil LA. MV Ca2+ w/ mod-sev MS (mean grad 69mmHg, Valve area 1.1-1.2). Mild to mod AoV sclerosis w/o stenosis.  . Syncope and collapse    a. Early 03/2014->did not seek medical attn.  . Tobacco abuse     Patient Active Problem List   Diagnosis Date Noted  . Anemia due to stage 4 chronic kidney disease ([place]) 02/22/2017  . Other iron deficiency anemia 02/26/2016  . Hypertensive heart disease   . Tobacco abuse   . Hyperlipidemia   . Coronary artery disease   . Diabetes mellitus without complication ([place])   . Hypertension   . CKD (chronic kidney disease), stage IV ([place])   . GERD (gastroesophageal reflux disease)   . DDD (degenerative disc disease)     Past Surgical History:  Procedure Laterality Date  . BREAST CYST ASPIRATION Right 2005   NEG  . CARDIAC CATHETERIZATION  06/2005   [facility]  . CARDIAC CATHETERIZATION  04/2014   [facility]  . COLONOSCOPY WITH PROPOFOL N/A 04/27/2016   Procedure: COLONOSCOPY WITH PROPOFOL;  Surgeon: Manya Silvas, MD;  Location: [facility] ENDOSCOPY;  Service: Endoscopy;  Laterality: N/A;  . CORONARY ANGIOPLASTY    . DILATATION & CURETTAGE/HYSTEROSCOPY WITH  MYOSURE N/A 09/18/2015   Procedure: DILATATION & CURETTAGE/HYSTEROSCOPY WITH MYOSURE;  Surgeon: Boykin Nearing, MD;  Location: [facility] ORS;  Service: Gynecology;  Laterality: N/A;  . ESOPHAGOGASTRODUODENOSCOPY (EGD) WITH PROPOFOL N/A 04/27/2016   Procedure: ESOPHAGOGASTRODUODENOSCOPY (EGD) WITH PROPOFOL;  Surgeon: Manya Silvas, MD;  Location: [facility] ENDOSCOPY;  Service: Endoscopy;  Laterality: N/A;  . NECK SURGERY     Degenerative Disk Disease and removal of a spinal cyst  . TUBAL LIGATION      Prior to Admission medications   Medication Sig Start Date End Date Taking? Authorizing Provider  allopurinol (ZYLOPRIM) 100 MG tablet Take 100 mg by mouth 2 (two) times daily.     [provider]  amLODipine (NORVASC) 2.5 MG tablet Take 2.5 mg by mouth daily. Total daily dose=7.5 mg    [provider]  amLODipine (NORVASC) 5 MG tablet Take 1 tablet (5 mg total) by mouth daily. Patient taking differently: Take 5 mg by mouth daily. Total daily dose=7.5 mg 10/26/15   Wellington Hampshire, MD  Calcium Carbonate-Vitamin D (CALCIUM 600+D) 600-400 MG-UNIT tablet Take 1 tablet by mouth daily.    [provider]  carvedilol (COREG) 12.5 MG tablet Take 1 tablet (12.5 mg total) by mouth 2 (two) times daily. 11/19/19   Theora Gianotti, NP  cetirizine (ZYRTEC) 10 MG tablet Take 10 mg by mouth at bedtime.     [provider]  cholecalciferol (VITAMIN D) 25 MCG (1000 UT) tablet Take 1,000 Units by mouth daily.    [provider]  colchicine 0.6 MG tablet Take 0.6 mg by mouth daily as needed (gout flare).  08/14/15   [provider]  ferrous sulfate 325 (65 FE) MG tablet Take 325 mg by mouth every other day.    [provider]  fluticasone (FLONASE) 50 MCG/ACT nasal spray Place 1 spray into both nostrils at bedtime.     [provider]  GLUCOSAMINE HCL PO Take 1 tablet by mouth daily.    [provider]  HUMALOG KWIKPEN 100 UNIT/ML KwikPen Inject 8-28 Units into the skin See admin instructions. Inject 8 units subcutaneously in the morning, 28 units with lunch & 28 units with supper 11/13/19   [provider]  LANTUS SOLOSTAR 100 UNIT/ML Solostar Pen Inject 40-44 Units into the skin See admin instructions. Inject 40 units subcutaneously in the morning & inject 44 units subcutaneously at night. 11/13/19   [provider]  linaclotide (LINZESS) 72 MCG capsule Take 72 mcg by mouth every other day.  01/03/17 11/18/28  [provider]  lisinopril (PRINIVIL,ZESTRIL) 5 MG tablet Take 5 mg by mouth daily.     [provider]  magnesium oxide (MAG-OX) 400 MG tablet Take 400 mg by  mouth 3 (three) times daily.     [provider]  Multiple Vitamin (MULTIVITAMIN WITH MINERALS) TABS tablet Take 1 tablet by mouth daily.    [provider]  Olopatadine HCl (PATADAY) 0.2 % SOLN Place 1 drop into both eyes 2 (two) times daily as needed (allergy eyes.).    [provider]  Omega-3 Fatty Acids (FISH OIL MAXIMUM STRENGTH) 1200 MG CAPS Take 1,000 mg by mouth 3 (three) times daily.  02/10/16   [provider]  pantoprazole (PROTONIX) 20 MG tablet Take 20 mg by mouth 2 (two) times daily.  07/25/16   [provider]  rosuvastatin (CRESTOR) 40 MG tablet Take 1 tablet (40 mg total) by mouth daily. Patient taking differently: Take 40  mg by mouth at bedtime.  10/10/19 01/08/20  Wellington Hampshire, MD  sodium bicarbonate 650 MG tablet Take 650 mg by mouth 3 (three) times daily.    [provider]  SYMBICORT 160-4.5 MCG/ACT inhaler Inhale 2 puffs into the lungs 2 (two) times daily. 11/13/19   [provider]  tiZANidine (ZANAFLEX) 4 MG tablet Take 4 mg by mouth 3 (three) times daily as needed for muscle spasms. 08/22/19   [provider]  triamcinolone cream (KENALOG) 0.1 % Apply 1 application topically 2 (two) times daily as needed (skin rash/irritation).     [provider]    Allergies Aspirin and Hydrochlorothiazide  Family History  Problem Relation Age of Onset  . Heart disease Mother   . Heart attack Mother   . Hypertension Mother   . Hyperlipidemia Mother   . Breast cancer Sister     Social History Social History   Tobacco Use  . Smoking status: Current Every Day Smoker    Packs/day: 0.50    Years: 40.00    Pack years: 20.00    Types: Cigarettes  . Smokeless tobacco: Never Used  Substance Use Topics  . Alcohol use: No  . Drug use: No      Review of Systems Constitutional: No fever/chills, high potassium Eyes: No visual changes. ENT: No sore throat. Cardiovascular: Denies chest  pain. Respiratory: Denies shortness of breath. Gastrointestinal: No abdominal pain.  No nausea, no vomiting.  No diarrhea.  No constipation. Genitourinary: Negative for dysuria. Musculoskeletal: Negative for back pain. Skin: Negative for rash. Neurological: Negative for headaches, focal weakness or numbness. All other ROS negative ____________________________________________   PHYSICAL EXAM:  VITAL SIGNS: ED Triage Vitals  Enc Vitals Group     BP 12/26/19 2047 (!) 143/67     Pulse Rate 12/26/19 2047 76     Resp 12/26/19 2047 18     Temp 12/26/19 2047 98.2 F (36.8 C)     Temp Source 12/26/19 2047 Oral     SpO2 12/26/19 2047 97 %     Weight 12/26/19 2048 166 lb (75.3 kg)     Height 12/26/19 2048 5\' 3"  (1.6 m)     Head Circumference --      Peak Flow --      Pain Score 12/26/19 2047 0     Pain Loc --      Pain Edu? --      Excl. in [place]? --     Constitutional: Alert and oriented. Well appearing and in no acute distress. Eyes: Conjunctivae are normal. EOMI. Head: Atraumatic. Nose: No congestion/rhinnorhea. Mouth/Throat: Mucous membranes are moist.   Neck: No stridor. Trachea Midline. FROM Cardiovascular: Normal rate, regular rhythm. Grossly normal heart sounds.  Good peripheral circulation. Respiratory: Normal respiratory effort.  No retractions. Lungs CTAB. Gastrointestinal: Soft and nontender. No distention. No abdominal bruits.  Musculoskeletal: No lower extremity tenderness nor edema.  No joint effusions. Neurologic:  Normal speech and language. No gross focal neurologic deficits are appreciated.  Skin:  Skin is warm, dry and intact. No rash noted. Psychiatric: Mood and affect are normal. Speech and behavior are normal. GU: Deferred   ____________________________________________   LABS (all labs ordered are listed, but only abnormal results are displayed)  Labs Reviewed  BASIC METABOLIC PANEL - Abnormal; Notable for the following components:      Result Value    Potassium 5.9 (*)    CO2 19 (*)    Glucose, Bld 210 (*)  BUN 43 (*)    Creatinine, Ser 4.00 (*)    Calcium 8.4 (*)    GFR calc non Af Amer 11 (*)    GFR calc Af Amer 13 (*)    All other components within normal limits  CBC - Abnormal; Notable for the following components:   RBC 3.31 (*)    Hemoglobin 9.6 (*)    HCT 29.7 (*)    RDW 15.7 (*)    All other components within normal limits  TROPONIN I (HIGH SENSITIVITY)   ____________________________________________   ED ECG REPORT I, Vanessa Macoupin, the attending physician, personally viewed and interpreted this ECG.  EKG is normal sinus rate of 70, no ST elevation, no T wave inversions, normal intervals ____________________________________________   ____________________________________________   PROCEDURES  Procedure(s) performed (including Critical Care):  Procedures   ____________________________________________   INITIAL IMPRESSION / ASSESSMENT AND PLAN / ED COURSE  Caitlin Bass was evaluated in Emergency Department on 12/26/2019 for the symptoms described in the history of present illness. She was evaluated in the context of the global COVID-19 pandemic, which necessitated consideration that the patient might be at risk for infection with the SARS-CoV-2 virus that causes COVID-19. Institutional protocols and algorithms that pertain to the evaluation of patients at risk for COVID-19 are in a state of rapid change based on information released by regulatory bodies including the CDC and federal and state organizations. These policies and algorithms were followed during the patient's care in the ED.     Patient is a well-appearing 69 year old with known CKD who comes in for hyperkalemia.  Patient is currently being treated for hyperkalemia and they are working on try to get her started on a potassium reducer.  However today her labs were 6.8 which is why she was sent here.  Patient states that she has not been eating or  drinking as well as 1 L of fluid was started in triage.  Repeat BMP does show potassium of 5.9 which is significantly down from earlier this morning of 6.8.  Her BUN is only 43 and her CO2 is 19 but she is already on bicarb supplement.  Patient denies any symptoms of UTI.  Her EKG is without changes.  Her hemoglobin was slightly low at 9.6 but she has not had a check over 1 year. Not on blood thinners. Suspect that this is from her CKD.  Her rectal exam there was not a ton of stool in the vault but what I could see appeared brown may be weakly Hemoccult positive.  I recommended she follow-up for recheck of this as well.  I discussed with patient the different options including admission for cardiac monitoring and monitoring her potassium versus trying to treat the potassium with the medications that her doctor wanted her to pick up tomorrow.  Given potassium is already lower at 5.9 patient would prefer to go home.  We will give her some insulin, dextrose, veltassa.  If her repeat BMP is stable or downtrending will send patient home and she will get close follow-up with her nephrologist and start the [facility] for home.  Otherwise she understands that if her potassiums are not getting better that she may require admission in order to get dialysis.  Repeat BMP shows a potassium of 5.6 and creatinine of 3.78.  Patient feels comfortable with discharge home and will follow up tomorrow to get her medications from her nephrologist.  She understands that she will need a recheck potassium in 2  days  I discussed the provisional nature of ED diagnosis, the treatment so far, the ongoing plan of care, follow up appointments and return precautions with the patient and any family or support people present. They expressed understanding and agreed with the plan, discharged home. ____________________________________________   FINAL CLINICAL IMPRESSION(S) / ED DIAGNOSES   Final diagnoses:  Hyperkalemia  Stage 4 chronic  kidney disease ([place])      MEDICATIONS GIVEN DURING THIS VISIT:  Medications  patiromer Daryll Drown) packet 8.4 g (8.4 g Oral Given 12/27/19 0110)  sodium chloride flush (NS) 0.9 % injection 3 mL (3 mLs Intravenous Given 12/26/19 2113)  sodium chloride 0.9 % bolus 1,000 mL (1,000 mLs Intravenous New Bag/Given 12/26/19 2113)  insulin aspart (novoLOG) injection 5 Units (5 Units Intravenous Given 12/27/19 0116)    And  dextrose 50 % solution 50 mL (50 mLs Intravenous Given 12/27/19 0111)     ED Discharge Orders    None       Note:  This document was prepared using Dragon voice recognition software and may include unintentional dictation errors.   Vanessa [place], MD 12/27/19 [phone]

## 2019-12-26 NOTE — H&P (View-Only) (Signed)
Office Visit    Patient Name: Caitlin Bass Date of Encounter: 12/27/2019  Primary Care Provider:  Marguerita Merles, MD Primary Cardiologist:  Kathlyn Sacramento, MD Electrophysiologist:  None   Chief Complaint    Caitlin Bass is a 69 y.o. female with a hx of mitral stenosis, tobacco abuse, anemiaDM2, HTN, CAD s/p PCI, CKDIV, MGUS, hyperkalemia, pulmonary nodules presents today for follow up of moderate to severe mitral stenosis.   Past Medical History    Past Medical History:  Diagnosis Date  . Anemia   . Anxiety   . Arthritis   . CKD (chronic kidney disease), stage IV ([place])   . Coronary artery disease    a. 03/2014 NSTEMI/Cath: LM nl, LAD 23m, 60d, D1 60, D2 nl, d3 40, LCX 100 w/ L->L collats, RCA non dom, min irregs, EF 55%-->Med Rx; b. 09/2019 MV: EF 30-44%, large basal inf, basal inflat, mid inf, mid inflat, apical defect - partially reversible.  . DDD (degenerative disc disease)    a. s/p upper back surgery spring 2015.  . Diabetes mellitus without complication ([place])   . Diverticulosis   . Fibromyalgia   . GERD (gastroesophageal reflux disease)   . Hiatal hernia   . Hyperlipidemia   . Hypertensive heart disease    a. 03/2015 Echo: 55-60%, mild conc LVH; b. 10/2019 Echo: EF 65-70%, mod LVH.  . Mitral stenosis    a. 10/2019 Echo: EF 65-70%, mod LVH. No rwma. Mildly dil LA. MV Ca2+ w/ mod-sev MS (mean grad 67mmHg, Valve area 1.1-1.2). Mild to mod AoV sclerosis w/o stenosis.  . Syncope and collapse    a. Early 03/2014->did not seek medical attn.  . Tobacco abuse    Past Surgical History:  Procedure Laterality Date  . BREAST CYST ASPIRATION Right 2005   NEG  . CARDIAC CATHETERIZATION  06/2005   [facility]  . CARDIAC CATHETERIZATION  04/2014   [facility]  . COLONOSCOPY WITH PROPOFOL N/A 04/27/2016   Procedure: COLONOSCOPY WITH PROPOFOL;  Surgeon: Manya Silvas, MD;  Location[facility] ENDOSCOPY;  Service: Endoscopy;  Laterality: N/A;  . CORONARY ANGIOPLASTY    . DILATATION &  CURETTAGE/HYSTEROSCOPY WITH MYOSURE N/A 09/18/2015   Procedure: DILATATION & CURETTAGE/HYSTEROSCOPY WITH MYOSURE;  Surgeon: Boykin Nearing, MD;  Location: [facility] ORS;  Service: Gynecology;  Laterality: N/A;  . ESOPHAGOGASTRODUODENOSCOPY (EGD) WITH PROPOFOL N/A 04/27/2016   Procedure: ESOPHAGOGASTRODUODENOSCOPY (EGD) WITH PROPOFOL;  Surgeon: Manya Silvas, MD;  Location: [facility] ENDOSCOPY;  Service: Endoscopy;  Laterality: N/A;  . NECK SURGERY     Degenerative Disk Disease and removal of a spinal cyst  . TUBAL LIGATION      Allergies  Allergies  Allergen Reactions  . Aspirin Other (See Comments)    "pt. has a history of ulcers" Stomach hurts  . Hydrochlorothiazide Itching    History of Present Illness    Caitlin Bass is a 69 y.o. female with a hx of hx of mitral stenosis, tobacco abuse, anemiaDM2, HTN, CAD s/p PCI, CKDIV, MGUS, hyperkalemia, pulmonary nodules last seen 11/19/19.  Her CAD is s/p NSTEMI May 2015 with catheterization showing occluded L Cx with left to left collaterals and otherwise nonobstructive disease. Echo with normal LVEF. Echo November 2019 with hyperdynamic LV systolic function, severe LVH, and trivial pericardial effusion. Seen Serv[facility] Snare 2020 with progressive dyspnea without chest pain. Stress test showed prior inferior/inferolateral infarct with mild peri-infarct ischemia and EF 44%. Due to CKD IV, continued medical therapy was recommended by Dr. Fletcher Anon  during office visit 10/10/19. Subsequent echo with LVEF 65-70%, moderate LVH, newly noted moderate to severe mitral stenosis with mean gradient of 47mmHg and valve area 1.1-1.2.   She was seen in the ED yesterday after being sent by her nephrologist for K 6.8. On arrival to ED her K was 5.9. She received insulin, dextrose, veltassa and was discharged after repeat K 5.6.  Tells me she is stopping another doctor's office today to pick up medication for this.  Encouraged her to follow closely with her  nephrologist.  When asked why she did not complete her TEE she tells me she "could not breathe" and did not feel like she could go through with the procedure.  She had shortness of breath, sore throat, ear hurting. Saw her PCP and was started on antibiotics. She is still on the antibiotics. Tells me it is getting better.   She is interested in pursuing TEE again.  Reports no chest pain, pressure, tightness.  She continues to report shortness of breath and dyspnea on exertion.  No lower extremity edema, orthopnea, PND.  No lightness, dizziness.  EKGs/Labs/Other Studies Reviewed:   The following studies were reviewed today:  Echo 11/18/19  1. Left ventricular ejection fraction, by visual estimation, is 65 to 70%. The left ventricle has normal function. There is moderately increased left ventricular hypertrophy.  2. Left ventricular diastolic parameters are indeterminate.  3. The left ventricle has no regional wall motion abnormalities.  4. Global right ventricle has normal systolic function.The right ventricular size is normal. No increase in right ventricular wall thickness.  5. Left atrial size was mildly dilated.  6. Right atrial size was normal.  7. The pericardial effusion is posterior to the left ventricle.  8. Trivial pericardial effusion is present.  9. Severe calcification of the mitral valve leaflet(s). 10. Severe mitral annular calcification. 11. Severe thickening of the mitral valve leaflet(s). 12. The mitral valve is abnormal. No evidence of mitral valve regurgitation. Moderate-severe mitral stenosis. Mean gradient is 12 mm Hg with valve area of 1.1 to 1.2. 13. The tricuspid valve is normal in structure. Tricuspid valve regurgitation is not demonstrated. 14. The aortic valve is normal in structure. Aortic valve regurgitation is not visualized. Mild to moderate aortic valve sclerosis/calcification without any evidence of aortic stenosis. 15. The pulmonic valve was normal in  structure. Pulmonic valve regurgitation is trivial. 16. TR signal is inadequate for assessing pulmonary artery systolic pressure. 17. The inferior vena cava is normal in size with greater than 50% respiratory variability, suggesting right atrial pressure of 3 mmHg.   EKG:  EKG is ordered today.  The ekg ordered today demonstrates sinus rhythm 72 bpm with left axis deviation and possible left atrial enlargement, stable T wave inversion in lead aVR, aVL  Recent Labs: 11/08/2019: ALT 19 12/26/2019: Hemoglobin 9.6; Platelets 313 12/27/2019: BUN 39; Creatinine, Ser 3.78; Potassium 5.6; Sodium 138  Recent Lipid Panel    Component Value Date/Time   CHOL 142 11/08/2019 1538   CHOL 291 (H) 07/23/2013 0429   TRIG 437 (H) 11/08/2019 1538   TRIG 550 (H) 07/23/2013 0429   HDL 33 (L) 11/08/2019 1538   HDL 37 (L) 07/23/2013 0429   CHOLHDL 4.3 11/08/2019 1538   VLDL UNABLE TO CALCULATE IF TRIGLYCERIDE OVER 400 mg/dL 11/08/2019 1538   VLDL SEE COMMENT 07/23/2013 0429   LDLCALC UNABLE TO CALCULATE IF TRIGLYCERIDE OVER 400 mg/dL 11/08/2019 1538   LDLCALC SEE COMMENT 07/23/2013 0429   LDLDIRECT 48.6 11/08/2019 1538  Home Medications   Current Meds  Medication Sig  . allopurinol (ZYLOPRIM) 100 MG tablet Take 100 mg by mouth 2 (two) times daily.  Marland Kitchen amLODipine (NORVASC) 2.5 MG tablet Take 2.5 mg by mouth daily. Total daily dose=7.5 mg  . amLODipine (NORVASC) 5 MG tablet Take 1 tablet (5 mg total) by mouth daily. (Patient taking differently: Take 5 mg by mouth daily. Total daily dose=7.5 mg)  . amoxicillin (AMOXIL) 250 MG capsule Take 250 mg by mouth 3 (three) times daily.   . Calcium Carbonate-Vitamin D (CALCIUM 600+D) 600-400 MG-UNIT tablet Take 1 tablet by mouth daily.  . carvedilol (COREG) 12.5 MG tablet Take 1 tablet (12.5 mg total) by mouth 2 (two) times daily.  . cetirizine (ZYRTEC) 10 MG tablet Take 10 mg by mouth at bedtime.   . cholecalciferol (VITAMIN D) 25 MCG (1000 UT) tablet Take 1,000  Units by mouth daily.  . colchicine 0.6 MG tablet Take 0.6 mg by mouth daily as needed (gout flare).   . ferrous sulfate 325 (65 FE) MG tablet Take 325 mg by mouth every other day.  . fluticasone (FLONASE) 50 MCG/ACT nasal spray Place 1 spray into both nostrils at bedtime.   Marland Kitchen GLUCOSAMINE HCL PO Take 1 tablet by mouth daily.  Marland Kitchen HUMALOG KWIKPEN 100 UNIT/ML KwikPen Inject 8-28 Units into the skin See admin instructions. Inject 8 units subcutaneously in the morning, 28 units with lunch & 28 units with supper  . LANTUS SOLOSTAR 100 UNIT/ML Solostar Pen Inject 40-44 Units into the skin See admin instructions. Inject 40 units subcutaneously in the morning & inject 44 units subcutaneously at night.  . linaclotide (LINZESS) 72 MCG capsule Take 72 mcg by mouth every other day.   . magnesium oxide (MAG-OX) 400 MG tablet Take 400 mg by mouth 3 (three) times daily.   . Multiple Vitamin (MULTIVITAMIN WITH MINERALS) TABS tablet Take 1 tablet by mouth daily.  . Olopatadine HCl (PATADAY) 0.2 % SOLN Place 1 drop into both eyes 2 (two) times daily as needed (allergy eyes.).  Marland Kitchen Omega-3 Fatty Acids (FISH OIL MAXIMUM STRENGTH) 1200 MG CAPS Take 1,000 mg by mouth 3 (three) times daily.   . pantoprazole (PROTONIX) 20 MG tablet Take 20 mg by mouth 2 (two) times daily.   . rosuvastatin (CRESTOR) 40 MG tablet Take 1 tablet (40 mg total) by mouth daily. (Patient taking differently: Take 40 mg by mouth at bedtime. )  . sodium bicarbonate 650 MG tablet Take 650 mg by mouth 3 (three) times daily.  . sodium polystyrene (KAYEXALATE) 15 GM/60ML suspension Take 60 g by mouth once.  . SYMBICORT 160-4.5 MCG/ACT inhaler Inhale 2 puffs into the lungs 2 (two) times daily.  Marland Kitchen tiZANidine (ZANAFLEX) 4 MG tablet Take 4 mg by mouth 3 (three) times daily as needed for muscle spasms.  Marland Kitchen triamcinolone cream (KENALOG) 0.1 % Apply 1 application topically 2 (two) times daily as needed (skin rash/irritation).     Review of Systems      Review  of Systems  Constitution: Positive for malaise/fatigue. Negative for chills and fever.  Cardiovascular: Positive for dyspnea on exertion. Negative for chest pain, leg swelling, near-syncope, orthopnea, palpitations and syncope.  Respiratory: Positive for shortness of breath. Negative for cough and wheezing.   Gastrointestinal: Negative for nausea and vomiting.  Neurological: Negative for dizziness, light-headedness and weakness.   All other systems reviewed and are otherwise negative except as noted above.  Physical Exam    VS:  BP (!) 144/60 (  BP Location: Left Arm, Patient Position: Sitting, Cuff Size: Normal)   Pulse 72   Ht 5\' 3"  (1.6 m)   Wt 165 lb 4 oz (75 kg)   SpO2 99%   BMI 29.27 kg/m  , BMI Body mass index is 29.27 kg/m. GEN: Well nourished, overweight,  well developed, in no acute distress. HEENT: normal. Neck: Supple, no JVD, carotid bruits, or masses. Cardiac: RRR, no  rubs, or gallops. Gr 2/6 systolic murmur. No clubbing, cyanosis, edema.  Radials/DP/PT 2+ and equal bilaterally.  Respiratory:  Respirations regular and unlabored, clear to auscultation bilaterally. GI: Soft, nontender, nondistended, BS + x 4. MS: No deformity or atrophy. Skin: Warm and dry, no rash. Neuro:  Strength and sensation are intact. Psych: Normal affect.   Assessment & Plan    1. Mitral stenosis - Moderate to severe by echo 10/2019. Euvolemic on exam today. Plan to proceed with further evaluation with transesophageal echocardiogram.  Was no-show to previous transesophageal echocardiogram secondary to worsening shortness of breath.  She has been rescheduled and educated about the importance of having this procedure done.  2. Hyperkalemia - Secondary to CKD. Following with nephrology. Seen in ED last night for K 6.8 at MD office, down to 5.9 in ED, given insulin/dextrose/veltassa, down to 5.6. Plans to go to MDs office today to pick up additional Rx for potassium lowering agent.   3. DOE -  Stable at baseline. Multifactorial. Recent intermediate stress testing suggesting inferior and inferolateral infarct with mild peri-infarct ischemia. Echo 11/18/19 with normal LVEF and mod-severe mitral stenosis. Long history of tobacco abuse. CBC yesterday with Hb 9.6, likely anemia of chronic disease, which can contribute to DOE.  Presently on antibiotics by her PCP.    4. CAD - Known Cx lesion by cath 2015 with recent intermediate risk stress test showing inferior and inferolateral infarct with mild peri-infarct ischemia. GDMT includes aspirin, beta blocker, statin, CCB.  ACE recently discontinued secondary to hyperkalemia by her nephrologist.  Cardiac catheterization percent limited by CKD.  Consider work-up of mitral stenosis pending results of TEE-if severe mitral stenosis will have to undertake high risk catheterization with limited contrast.  5. HTN - Continue amlodipine, carvedilol, beta-blocker.  Lisinopril recently discontinued by her nephrologist for hyperkalemia.  May benefit from further up titration of her carvedilol at a future date.  She was encouraged to check her blood pressure at home.  6. [place] with nephrology.   7. Hyperlipidemia/hypertriglyceridemia - Recent LDL 48.6, triglycerides 437 continue high intensity statin.  Strong consideration of Vascepa in the future, in the meantime she will continue her omega-3.  8. Pulmonary nodule - Follows with PCP. Recent CT shows decrease in bilateral pulmonary nodule. Radiology recommended 55-month follow-up.   9. Tobacco abuse - Smoking cessation encouraged. Recommend utilization of [id number].  Disposition: Follow up in 1 week(s) with Dr. Fletcher Anon or APP.  Loel Dubonnet, NP 12/27/2019, 3:57 PM

## 2019-12-26 NOTE — ED Triage Notes (Signed)
Pt sent to the er from her MD office due to a high potassium. Pt has a hx of same. Pt denies any symptoms.

## 2019-12-26 NOTE — Progress Notes (Deleted)
Cardiology Office Note   Date:  12/26/2019   ID:  Caitlin Bass, DOB [birth year], MRN [id number]  PCP:  Elisabeth Cara, NP  Cardiologist:   Kathlyn Sacramento, MD   No chief complaint on file.     History of Present Illness: Caitlin Bass is a 69 y.o. female who presents for a followup visit regarding coronary artery disease.  She had non-ST elevation myocardial infarction in May 2015. Echo showed nl LV fxn. Cardiac catheterization showed an occluded LCX with L-L collats and otherwise nonobstructive CAD. She was medically managed . 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  She has multiple other chronic conditions that include COPD, tobacco use, anemia of chronic disease, essential hypertension, hyperlipidemia and sleep apnea on CPAP. Echocardiogram in November 2019 showed hyperdynamic LV systolic function, severe left ventricular hypertrophy and trivial pericardial effusion. She was seen by Thurmond Butts last month for worsening exertional dyspnea.  No chest pain. She underwent a [employer] which showed evidence of prior inferior/inferolateral infarct with mild peri-infarct ischemia with ejection fraction of 44%.  She is scheduled to have an echocardiogram.  She reports that her shortness of breath is stable overall and she denies any recent chest pain.  She continues to smoke 7 to 10 cigarettes a day.  Past Medical History:  Diagnosis Date  . Anemia   . Anxiety   . Arthritis   . CKD (chronic kidney disease), stage IV ([place])   . Coronary artery disease    a. 03/2014 NSTEMI/Cath: LM nl, LAD 30m, 60d, D1 60, D2 nl, d3 40, LCX 100 w/ L->L collats, RCA non dom, min irregs, EF 55%-->Med Rx; b. 09/2019 MV: EF 30-44%, large basal inf, basal inflat, mid inf, mid inflat, apical defect - partially reversible.  . DDD (degenerative disc disease)    a. s/p upper back surgery spring 2015.  . Diabetes mellitus without complication ([place])   .  Diverticulosis   . Fibromyalgia   . GERD (gastroesophageal reflux disease)   . Hiatal hernia   . Hyperlipidemia   . Hypertensive heart disease    a. 03/2015 Echo: 55-60%, mild conc LVH; b. 10/2019 Echo: EF 65-70%, mod LVH.  . Mitral stenosis    a. 10/2019 Echo: EF 65-70%, mod LVH. No rwma. Mildly dil LA. MV Ca2+ w/ mod-sev MS (mean grad 26mmHg, Valve area 1.1-1.2). Mild to mod AoV sclerosis w/o stenosis.  . Syncope and collapse    a. Early 03/2014->did not seek medical attn.  . Tobacco abuse     Past Surgical History:  Procedure Laterality Date  . BREAST CYST ASPIRATION Right 2005   NEG  . CARDIAC CATHETERIZATION  06/2005   [facility]  . CARDIAC CATHETERIZATION  04/2014   [facility]  . COLONOSCOPY WITH PROPOFOL N/A 04/27/2016   Procedure: COLONOSCOPY WITH PROPOFOL;  Surgeon: Manya Silvas, MD;  Location: [facility] ENDOSCOPY;  Service: Endoscopy;  Laterality: N/A;  . CORONARY ANGIOPLASTY    . DILATATION & CURETTAGE/HYSTEROSCOPY WITH MYOSURE N/A 09/18/2015   Procedure: DILATATION & CURETTAGE/HYSTEROSCOPY WITH MYOSURE;  Surgeon: Boykin Nearing, MD;  Location: [facility] ORS;  Service: Gynecology;  Laterality: N/A;  . ESOPHAGOGASTRODUODENOSCOPY (EGD) WITH PROPOFOL N/A 04/27/2016   Procedure: ESOPHAGOGASTRODUODENOSCOPY (EGD) WITH PROPOFOL;  Surgeon: Manya Silvas, MD;  Location: [facility] ENDOSCOPY;  Service: Endoscopy;  Laterality: N/A;  . NECK SURGERY     Degenerative Disk Disease and removal of a spinal cyst  . TUBAL LIGATION  Current Outpatient Medications  Medication Sig Dispense Refill  . allopurinol (ZYLOPRIM) 100 MG tablet Take 100 mg by mouth 2 (two) times daily.    Marland Kitchen amLODipine (NORVASC) 2.5 MG tablet Take 2.5 mg by mouth daily. Total daily dose=7.5 mg    . amLODipine (NORVASC) 5 MG tablet Take 1 tablet (5 mg total) by mouth daily. (Patient taking differently: Take 5 mg by mouth daily. Total daily dose=7.5 mg) 30 tablet 3  . Calcium Carbonate-Vitamin D (CALCIUM 600+D) 600-400 MG-UNIT  tablet Take 1 tablet by mouth daily.    . carvedilol (COREG) 12.5 MG tablet Take 1 tablet (12.5 mg total) by mouth 2 (two) times daily. 180 tablet 0  . cetirizine (ZYRTEC) 10 MG tablet Take 10 mg by mouth at bedtime.     . cholecalciferol (VITAMIN D) 25 MCG (1000 UT) tablet Take 1,000 Units by mouth daily.    . colchicine 0.6 MG tablet Take 0.6 mg by mouth daily as needed (gout flare).     . ferrous sulfate 325 (65 FE) MG tablet Take 325 mg by mouth every other day.    . fluticasone (FLONASE) 50 MCG/ACT nasal spray Place 1 spray into both nostrils at bedtime.     Marland Kitchen GLUCOSAMINE HCL PO Take 1 tablet by mouth daily.    Marland Kitchen HUMALOG KWIKPEN 100 UNIT/ML KwikPen Inject 8-28 Units into the skin See admin instructions. Inject 8 units subcutaneously in the morning, 28 units with lunch & 28 units with supper    . LANTUS SOLOSTAR 100 UNIT/ML Solostar Pen Inject 40-44 Units into the skin See admin instructions. Inject 40 units subcutaneously in the morning & inject 44 units subcutaneously at night.    . linaclotide (LINZESS) 72 MCG capsule Take 72 mcg by mouth every other day.     . lisinopril (PRINIVIL,ZESTRIL) 5 MG tablet Take 5 mg by mouth daily.     . magnesium oxide (MAG-OX) 400 MG tablet Take 400 mg by mouth 3 (three) times daily.     . Multiple Vitamin (MULTIVITAMIN WITH MINERALS) TABS tablet Take 1 tablet by mouth daily.    . Olopatadine HCl (PATADAY) 0.2 % SOLN Place 1 drop into both eyes 2 (two) times daily as needed (allergy eyes.).    Marland Kitchen Omega-3 Fatty Acids (FISH OIL MAXIMUM STRENGTH) 1200 MG CAPS Take 1,000 mg by mouth 3 (three) times daily.   0  . pantoprazole (PROTONIX) 20 MG tablet Take 20 mg by mouth 2 (two) times daily.   0  . rosuvastatin (CRESTOR) 40 MG tablet Take 1 tablet (40 mg total) by mouth daily. (Patient taking differently: Take 40 mg by mouth at bedtime. ) 90 tablet 3  . sodium bicarbonate 650 MG tablet Take 650 mg by mouth 3 (three) times daily.    . SYMBICORT 160-4.5 MCG/ACT inhaler  Inhale 2 puffs into the lungs 2 (two) times daily.    Marland Kitchen tiZANidine (ZANAFLEX) 4 MG tablet Take 4 mg by mouth 3 (three) times daily as needed for muscle spasms.    Marland Kitchen triamcinolone cream (KENALOG) 0.1 % Apply 1 application topically 2 (two) times daily as needed (skin rash/irritation).      No current facility-administered medications for this visit.    Allergies:   Aspirin and Hydrochlorothiazide    Social History:  The patient  reports that she has been smoking cigarettes. She has a 20.00 pack-year smoking history. She has never used smokeless tobacco. She reports that she does not drink alcohol or use drugs.  Family History:  The patient's family history includes Breast cancer in her sister; Heart attack in her mother; Heart disease in her mother; Hyperlipidemia in her mother; Hypertension in her mother.    ROS:  Please see the history of present illness.   Otherwise, review of systems are positive for none.   All other systems are reviewed and negative.    PHYSICAL EXAM: VS:  There were no vitals taken for this visit. , BMI There is no height or weight on file to calculate BMI. GEN: Well nourished, well developed, in no acute distress  HEENT: normal  Neck: no JVD, carotid bruits, or masses Cardiac: RRR; no  rubs, or gallops,no edema . 2/6 SEM at the aortic area.  Respiratory:  clear to auscultation bilaterally, normal work of breathing GI: soft, nontender, nondistended, + BS MS: no deformity or atrophy  Skin: warm and dry, no rash Neuro:  Strength and sensation are intact Psych: euthymic mood, full affect   EKG:  EKG is ordered today. The ekg ordered today demonstrates normal sinus rhythm with possible left atrial enlargement, lateral T wave changes suggestive of ischemia.  Recent Labs: 02/27/2019: Hemoglobin 12.4; Platelets 326 11/08/2019: ALT 19    Lipid Panel    Component Value Date/Time   CHOL 142 11/08/2019 1538   CHOL 291 (H) 07/23/2013 0429   TRIG 437 (H)  11/08/2019 1538   TRIG 550 (H) 07/23/2013 0429   HDL 33 (L) 11/08/2019 1538   HDL 37 (L) 07/23/2013 0429   CHOLHDL 4.3 11/08/2019 1538   VLDL UNABLE TO CALCULATE IF TRIGLYCERIDE OVER 400 mg/dL 11/08/2019 1538   VLDL SEE COMMENT 07/23/2013 0429   LDLCALC UNABLE TO CALCULATE IF TRIGLYCERIDE OVER 400 mg/dL 11/08/2019 1538   LDLCALC SEE COMMENT 07/23/2013 0429   LDLDIRECT 48.6 11/08/2019 1538      Wt Readings from Last 3 Encounters:  11/19/19 170 lb 8 oz (77.3 kg)  10/10/19 169 lb 12 oz (77 kg)  10/01/19 172 lb (78 kg)        ASSESSMENT AND PLAN:  1.  Coronary artery disease involving native coronary arteries with other forms of angina: The patient has exertional dyspnea but she reports that this has been stable overall.  She has enough medical reasons to explain her dyspnea including continued tobacco use and overall deconditioning.  She denies any chest pain.  Coronary ischemia cannot be completely excluded as an etiology.  Her stress test recently was abnormal but the findings are expected given her coronary anatomy of an occluded distal dominant left circumflex.  There was 60% distal LAD stenosis and the coronary arteries were overall heavily calcified.  Given that her symptoms are overall stable and the fact that she has advanced chronic kidney disease, I recommend continuing aggressive medical therapy for now.  2. Essential hypertension: Blood pressure is  reasonably controlled now on amlodipine and metoprolol.  3. Hyperlipidemia: Currently on atorvastatin 40 mg once daily.  She reports that she got lipid profile checked recently and was not at target.  Most recent lipid profile in our system in 2014 showed severely elevated cholesterol.  I think we have to be more aggressive with her hyperlipidemia and thus I elected to switch from atorvastatin to rosuvastatin 40 mg daily.  Repeat lipid and liver profile in 6 weeks.  We might have to consider adding Zetia and also consider Vascepa if  triglycerides continue to be elevated.  4. Chronic kidney disease: Followed by nephrology.  She was seen by Dr.  Swain yesterday and her creatinine was 3.5.  5. Tobacco use:  I again discussed with her the importance of smoking cessation.  6.  Pulmonary nodules: Follow-up CT scan was recommended.   Disposition:   FU with me in 6 months  Signed,  Kathlyn Sacramento, MD  12/26/2019 8:08 AM    [place]

## 2019-12-26 NOTE — Progress Notes (Signed)
Office Visit    Patient Name: Caitlin Bass Date of Encounter: 12/27/2019  Primary Care Provider:  Marguerita Merles, MD Primary Cardiologist:  Kathlyn Sacramento, MD Electrophysiologist:  None   Chief Complaint    Caitlin Bass is a 69 y.o. female with a hx of mitral stenosis, tobacco abuse, anemiaDM2, HTN, CAD s/p PCI, CKDIV, MGUS, hyperkalemia, pulmonary nodules presents today for follow up of moderate to severe mitral stenosis.   Past Medical History    Past Medical History:  Diagnosis Date  . Anemia   . Anxiety   . Arthritis   . CKD (chronic kidney disease), stage IV ([place])   . Coronary artery disease    a. 03/2014 NSTEMI/Cath: LM nl, LAD 68m, 60d, D1 60, D2 nl, d3 40, LCX 100 w/ L->L collats, RCA non dom, min irregs, EF 55%-->Med Rx; b. 09/2019 MV: EF 30-44%, large basal inf, basal inflat, mid inf, mid inflat, apical defect - partially reversible.  . DDD (degenerative disc disease)    a. s/p upper back surgery spring 2015.  . Diabetes mellitus without complication ([place])   . Diverticulosis   . Fibromyalgia   . GERD (gastroesophageal reflux disease)   . Hiatal hernia   . Hyperlipidemia   . Hypertensive heart disease    a. 03/2015 Echo: 55-60%, mild conc LVH; b. 10/2019 Echo: EF 65-70%, mod LVH.  . Mitral stenosis    a. 10/2019 Echo: EF 65-70%, mod LVH. No rwma. Mildly dil LA. MV Ca2+ w/ mod-sev MS (mean grad 12mmHg, Valve area 1.1-1.2). Mild to mod AoV sclerosis w/o stenosis.  . Syncope and collapse    a. Early 03/2014->did not seek medical attn.  . Tobacco abuse    Past Surgical History:  Procedure Laterality Date  . BREAST CYST ASPIRATION Right 2005   NEG  . CARDIAC CATHETERIZATION  06/2005   [facility]  . CARDIAC CATHETERIZATION  04/2014   [facility]  . COLONOSCOPY WITH PROPOFOL N/A 04/27/2016   Procedure: COLONOSCOPY WITH PROPOFOL;  Surgeon: Manya Silvas, MD;  Location: [facility] ENDOSCOPY;  Service: Endoscopy;  Laterality: N/A;  . CORONARY ANGIOPLASTY    . DILATATION &  CURETTAGE/HYSTEROSCOPY WITH MYOSURE N/A 09/18/2015   Procedure: DILATATION & CURETTAGE/HYSTEROSCOPY WITH MYOSURE;  Surgeon: Boykin Nearing, MD;  Location: [facility] ORS;  Service: Gynecology;  Laterality: N/A;  . ESOPHAGOGASTRODUODENOSCOPY (EGD) WITH PROPOFOL N/A 04/27/2016   Procedure: ESOPHAGOGASTRODUODENOSCOPY (EGD) WITH PROPOFOL;  Surgeon: Manya Silvas, MD;  Location: [facility] ENDOSCOPY;  Service: Endoscopy;  Laterality: N/A;  . NECK SURGERY     Degenerative Disk Disease and removal of a spinal cyst  . TUBAL LIGATION      Allergies  Allergies  Allergen Reactions  . Aspirin Other (See Comments)    "pt. has a history of ulcers" Stomach hurts  . Hydrochlorothiazide Itching    History of Present Illness    Caitlin Bass is a 69 y.o. female with a hx of hx of mitral stenosis, tobacco abuse, anemiaDM2, HTN, CAD s/p PCI, CKDIV, MGUS, hyperkalemia, pulmonary nodules last seen 11/19/19.  Her CAD is s/p NSTEMI May 2015 with catheterization showing occluded L Cx with left to left collaterals and otherwise nonobstructive disease. Echo with normal LVEF. Echo November 2019 with hyperdynamic LV systolic function, severe LVH, and trivial pericardial effusion. Seen [facility] Sn[facility] 2020 with progressive dyspnea without chest pain. Stress test showed prior inferior/inferolateral infarct with mild peri-infarct ischemia and EF 44%. Due to CKD IV, continued medical therapy was recommended by Dr. Fletcher Anon  during office visit 10/10/19. Subsequent echo with LVEF 65-70%, moderate LVH, newly noted moderate to severe mitral stenosis with mean gradient of 65mmHg and valve area 1.1-1.2.   She was seen in the ED yesterday after being sent by her nephrologist for K 6.8. On arrival to ED her K was 5.9. She received insulin, dextrose, veltassa and was discharged after repeat K 5.6.  Tells me she is stopping another doctor's office today to pick up medication for this.  Encouraged her to follow closely with her  nephrologist.  When asked why she did not complete her TEE she tells me she "could not breathe" and did not feel like she could go through with the procedure.  She had shortness of breath, sore throat, ear hurting. Saw her PCP and was started on antibiotics. She is still on the antibiotics. Tells me it is getting better.   She is interested in pursuing TEE again.  Reports no chest pain, pressure, tightness.  She continues to report shortness of breath and dyspnea on exertion.  No lower extremity edema, orthopnea, PND.  No lightness, dizziness.  EKGs/Labs/Other Studies Reviewed:   The following studies were reviewed today:  Echo 11/18/19  1. Left ventricular ejection fraction, by visual estimation, is 65 to 70%. The left ventricle has normal function. There is moderately increased left ventricular hypertrophy.  2. Left ventricular diastolic parameters are indeterminate.  3. The left ventricle has no regional wall motion abnormalities.  4. Global right ventricle has normal systolic function.The right ventricular size is normal. No increase in right ventricular wall thickness.  5. Left atrial size was mildly dilated.  6. Right atrial size was normal.  7. The pericardial effusion is posterior to the left ventricle.  8. Trivial pericardial effusion is present.  9. Severe calcification of the mitral valve leaflet(s). 10. Severe mitral annular calcification. 11. Severe thickening of the mitral valve leaflet(s). 12. The mitral valve is abnormal. No evidence of mitral valve regurgitation. Moderate-severe mitral stenosis. Mean gradient is 12 mm Hg with valve area of 1.1 to 1.2. 13. The tricuspid valve is normal in structure. Tricuspid valve regurgitation is not demonstrated. 14. The aortic valve is normal in structure. Aortic valve regurgitation is not visualized. Mild to moderate aortic valve sclerosis/calcification without any evidence of aortic stenosis. 15. The pulmonic valve was normal in  structure. Pulmonic valve regurgitation is trivial. 16. TR signal is inadequate for assessing pulmonary artery systolic pressure. 17. The inferior vena cava is normal in size with greater than 50% respiratory variability, suggesting right atrial pressure of 3 mmHg.   EKG:  EKG is ordered today.  The ekg ordered today demonstrates sinus rhythm 72 bpm with left axis deviation and possible left atrial enlargement, stable T wave inversion in lead aVR, aVL  Recent Labs: 11/08/2019: ALT 19 12/26/2019: Hemoglobin 9.6; Platelets 313 12/27/2019: BUN 39; Creatinine, Ser 3.78; Potassium 5.6; Sodium 138  Recent Lipid Panel    Component Value Date/Time   CHOL 142 11/08/2019 1538   CHOL 291 (H) 07/23/2013 0429   TRIG 437 (H) 11/08/2019 1538   TRIG 550 (H) 07/23/2013 0429   HDL 33 (L) 11/08/2019 1538   HDL 37 (L) 07/23/2013 0429   CHOLHDL 4.3 11/08/2019 1538   VLDL UNABLE TO CALCULATE IF TRIGLYCERIDE OVER 400 mg/dL 11/08/2019 1538   VLDL SEE COMMENT 07/23/2013 0429   LDLCALC UNABLE TO CALCULATE IF TRIGLYCERIDE OVER 400 mg/dL 11/08/2019 1538   LDLCALC SEE COMMENT 07/23/2013 0429   LDLDIRECT 48.6 11/08/2019 1538  Home Medications   Current Meds  Medication Sig  . allopurinol (ZYLOPRIM) 100 MG tablet Take 100 mg by mouth 2 (two) times daily.  Marland Kitchen amLODipine (NORVASC) 2.5 MG tablet Take 2.5 mg by mouth daily. Total daily dose=7.5 mg  . amLODipine (NORVASC) 5 MG tablet Take 1 tablet (5 mg total) by mouth daily. (Patient taking differently: Take 5 mg by mouth daily. Total daily dose=7.5 mg)  . amoxicillin (AMOXIL) 250 MG capsule Take 250 mg by mouth 3 (three) times daily.   . Calcium Carbonate-Vitamin D (CALCIUM 600+D) 600-400 MG-UNIT tablet Take 1 tablet by mouth daily.  . carvedilol (COREG) 12.5 MG tablet Take 1 tablet (12.5 mg total) by mouth 2 (two) times daily.  . cetirizine (ZYRTEC) 10 MG tablet Take 10 mg by mouth at bedtime.   . cholecalciferol (VITAMIN D) 25 MCG (1000 UT) tablet Take 1,000  Units by mouth daily.  . colchicine 0.6 MG tablet Take 0.6 mg by mouth daily as needed (gout flare).   . ferrous sulfate 325 (65 FE) MG tablet Take 325 mg by mouth every other day.  . fluticasone (FLONASE) 50 MCG/ACT nasal spray Place 1 spray into both nostrils at bedtime.   Marland Kitchen GLUCOSAMINE HCL PO Take 1 tablet by mouth daily.  Marland Kitchen HUMALOG KWIKPEN 100 UNIT/ML KwikPen Inject 8-28 Units into the skin See admin instructions. Inject 8 units subcutaneously in the morning, 28 units with lunch & 28 units with supper  . LANTUS SOLOSTAR 100 UNIT/ML Solostar Pen Inject 40-44 Units into the skin See admin instructions. Inject 40 units subcutaneously in the morning & inject 44 units subcutaneously at night.  . linaclotide (LINZESS) 72 MCG capsule Take 72 mcg by mouth every other day.   . magnesium oxide (MAG-OX) 400 MG tablet Take 400 mg by mouth 3 (three) times daily.   . Multiple Vitamin (MULTIVITAMIN WITH MINERALS) TABS tablet Take 1 tablet by mouth daily.  . Olopatadine HCl (PATADAY) 0.2 % SOLN Place 1 drop into both eyes 2 (two) times daily as needed (allergy eyes.).  Marland Kitchen Omega-3 Fatty Acids (FISH OIL MAXIMUM STRENGTH) 1200 MG CAPS Take 1,000 mg by mouth 3 (three) times daily.   . pantoprazole (PROTONIX) 20 MG tablet Take 20 mg by mouth 2 (two) times daily.   . rosuvastatin (CRESTOR) 40 MG tablet Take 1 tablet (40 mg total) by mouth daily. (Patient taking differently: Take 40 mg by mouth at bedtime. )  . sodium bicarbonate 650 MG tablet Take 650 mg by mouth 3 (three) times daily.  . sodium polystyrene (KAYEXALATE) 15 GM/60ML suspension Take 60 g by mouth once.  . SYMBICORT 160-4.5 MCG/ACT inhaler Inhale 2 puffs into the lungs 2 (two) times daily.  Marland Kitchen tiZANidine (ZANAFLEX) 4 MG tablet Take 4 mg by mouth 3 (three) times daily as needed for muscle spasms.  Marland Kitchen triamcinolone cream (KENALOG) 0.1 % Apply 1 application topically 2 (two) times daily as needed (skin rash/irritation).     Review of Systems      Review  of Systems  Constitution: Positive for malaise/fatigue. Negative for chills and fever.  Cardiovascular: Positive for dyspnea on exertion. Negative for chest pain, leg swelling, near-syncope, orthopnea, palpitations and syncope.  Respiratory: Positive for shortness of breath. Negative for cough and wheezing.   Gastrointestinal: Negative for nausea and vomiting.  Neurological: Negative for dizziness, light-headedness and weakness.   All other systems reviewed and are otherwise negative except as noted above.  Physical Exam    VS:  BP (!) 144/60 (  BP Location: Left Arm, Patient Position: Sitting, Cuff Size: Normal)   Pulse 72   Ht 5\' 3"  (1.6 m)   Wt 165 lb 4 oz (75 kg)   SpO2 99%   BMI 29.27 kg/m  , BMI Body mass index is 29.27 kg/m. GEN: Well nourished, overweight,  well developed, in no acute distress. HEENT: normal. Neck: Supple, no JVD, carotid bruits, or masses. Cardiac: RRR, no  rubs, or gallops. Gr 2/6 systolic murmur. No clubbing, cyanosis, edema.  Radials/DP/PT 2+ and equal bilaterally.  Respiratory:  Respirations regular and unlabored, clear to auscultation bilaterally. GI: Soft, nontender, nondistended, BS + x 4. MS: No deformity or atrophy. Skin: Warm and dry, no rash. Neuro:  Strength and sensation are intact. Psych: Normal affect.   Assessment & Plan    1. Mitral stenosis - Moderate to severe by echo 10/2019. Euvolemic on exam today. Plan to proceed with further evaluation with transesophageal echocardiogram.  Was no-show to previous transesophageal echocardiogram secondary to worsening shortness of breath.  She has been rescheduled and educated about the importance of having this procedure done.  2. Hyperkalemia - Secondary to CKD. Following with nephrology. Seen in ED last night for K 6.8 at MD office, down to 5.9 in ED, given insulin/dextrose/veltassa, down to 5.6. Plans to go to MDs office today to pick up additional Rx for potassium lowering agent.   3. DOE -  Stable at baseline. Multifactorial. Recent intermediate stress testing suggesting inferior and inferolateral infarct with mild peri-infarct ischemia. Echo 11/18/19 with normal LVEF and mod-severe mitral stenosis. Long history of tobacco abuse. CBC yesterday with Hb 9.6, likely anemia of chronic disease, which can contribute to DOE.  Presently on antibiotics by her PCP.    4. CAD - Known Cx lesion by cath 2015 with recent intermediate risk stress test showing inferior and inferolateral infarct with mild peri-infarct ischemia. GDMT includes aspirin, beta blocker, statin, CCB.  ACE recently discontinued secondary to hyperkalemia by her nephrologist.  Cardiac catheterization percent limited by CKD.  Consider work-up of mitral stenosis pending results of TEE-if severe mitral stenosis will have to undertake high risk catheterization with limited contrast.  5. HTN - Continue amlodipine, carvedilol, beta-blocker.  Lisinopril recently discontinued by her nephrologist for hyperkalemia.  May benefit from further up titration of her carvedilol at a future date.  She was encouraged to check her blood pressure at home.  6. [place] with nephrology.   7. Hyperlipidemia/hypertriglyceridemia - Recent LDL 48.6, triglycerides 437 continue high intensity statin.  Strong consideration of Vascepa in the future, in the meantime she will continue her omega-3.  8. Pulmonary nodule - Follows with PCP. Recent CT shows decrease in bilateral pulmonary nodule. Radiology recommended 88-month follow-up.   9. Tobacco abuse - Smoking cessation encouraged. Recommend utilization of [id number].  Disposition: Follow up in 1 week(s) with Dr. Fletcher Anon or APP.  Loel Dubonnet, NP 12/27/2019, 3:57 PM

## 2019-12-27 ENCOUNTER — Encounter: Payer: Self-pay | Admitting: Family

## 2019-12-27 ENCOUNTER — Other Ambulatory Visit
Admission: RE | Admit: 2019-12-27 | Discharge: 2019-12-27 | Disposition: A | Discharge status: Home / Self Care | Payer: Medicare Other | Source: Ambulatory Visit | Attending: Cardiovascular Disease | Admitting: Cardiovascular Disease

## 2019-12-27 ENCOUNTER — Ambulatory Visit (INDEPENDENT_AMBULATORY_CARE_PROVIDER_SITE_OTHER): Payer: Medicare Other | Admitting: Family

## 2019-12-27 VITALS — BP 144/60 | HR 72 | Ht 63.0 in | Wt 165.2 lb

## 2019-12-27 DIAGNOSIS — Z720 Tobacco use: Secondary | ICD-10-CM

## 2019-12-27 DIAGNOSIS — R0600 Dyspnea, unspecified: Secondary | ICD-10-CM

## 2019-12-27 DIAGNOSIS — E875 Hyperkalemia: Secondary | ICD-10-CM | POA: Diagnosis not present

## 2019-12-27 DIAGNOSIS — I25118 Atherosclerotic heart disease of native coronary artery with other forms of angina pectoris: Secondary | ICD-10-CM | POA: Diagnosis not present

## 2019-12-27 DIAGNOSIS — I050 Rheumatic mitral stenosis: Secondary | ICD-10-CM

## 2019-12-27 DIAGNOSIS — N184 Chronic kidney disease, stage 4 (severe): Secondary | ICD-10-CM

## 2019-12-27 DIAGNOSIS — R0609 Other forms of dyspnea: Secondary | ICD-10-CM

## 2019-12-27 DIAGNOSIS — E782 Mixed hyperlipidemia: Secondary | ICD-10-CM

## 2019-12-27 DIAGNOSIS — I10 Essential (primary) hypertension: Secondary | ICD-10-CM

## 2019-12-27 LAB — BASIC METABOLIC PANEL
Anion gap: 7 (ref 5–15)
BUN: 39 mg/dL — ABNORMAL HIGH (ref 8–23)
CO2: 19 mmol/L — ABNORMAL LOW (ref 22–32)
Calcium: 8.4 mg/dL — ABNORMAL LOW (ref 8.9–10.3)
Chloride: 112 mmol/L — ABNORMAL HIGH (ref 98–111)
Creatinine, Ser: 3.78 mg/dL — ABNORMAL HIGH (ref 0.44–1.00)
GFR calc Af Amer: 13 mL/min — ABNORMAL LOW (ref >60)
GFR calc non Af Amer: 12 mL/min — ABNORMAL LOW (ref >60)
Glucose, Bld: 173 mg/dL — ABNORMAL HIGH (ref 70–99)
Potassium: 5.6 mmol/L — ABNORMAL HIGH (ref 3.5–5.1)
Sodium: 138 mmol/L (ref 135–145)

## 2019-12-27 LAB — SARS CORONAVIRUS 2 (TAT 6-24 HRS): SARS Coronavirus 2: NEGATIVE

## 2019-12-27 MED ORDER — PATIROMER SORBITEX CALCIUM 8.4 G PO PACK
8.4000 g | PACK | Freq: Every day | ORAL | Status: DC
  Administered 2019-12-27: 8.4 g via ORAL
  Filled 2019-12-27 (×2): qty 1

## 2019-12-27 MED ORDER — DEXTROSE 50 % IV SOLN
1.0000 | Freq: Once | INTRAVENOUS | Status: AC
  Administered 2019-12-27: 50 mL via INTRAVENOUS
  Filled 2019-12-27: qty 50

## 2019-12-27 MED ORDER — INSULIN ASPART 100 UNIT/ML IV SOLN
5.0000 [IU] | Freq: Once | INTRAVENOUS | Status: AC
  Administered 2019-12-27: 5 [IU] via INTRAVENOUS
  Filled 2019-12-27: qty 0.05

## 2019-12-27 NOTE — Discharge Instructions (Addendum)
Your potassium was 5.8 and on repeat 5.6. It is important you get the medication veltassa that was prescribed by her nephrologist.  Make sure you pick that up tomorrow.  You should have a recheck in 2 days.  Your blood levels were also slightly lower than your baseline but you had not had a check in over a year.  Today they were 9.6.  You should also have this rechecked in 2 days.  If you have not had a colonoscopy you should have that done as well.  You can return to the ER for any other concerns.

## 2019-12-27 NOTE — ED Notes (Signed)
Reviewed discharge instructions, follow-up care, and prescriptions with patient. Patient verbalized understanding of all information reviewed. Patient stable, with no distress noted at this time.    

## 2019-12-27 NOTE — Patient Instructions (Addendum)
Medication Instructions:  - Your physician recommends that you continue on your current medications as directed. Please refer to the Current Medication list given to you today.  *If you need a refill on your cardiac medications before your next appointment, please call your pharmacy*  Lab Work: - Pre-procedure COVID swab- today - Right after you leave our office, get back in [facility] will come out to the car to swab you.   If you have labs (blood work) drawn today and your tests are completely normal, you will receive your results only by: Marland Kitchen MyChart Message (if you have MyChart) OR . A paper copy in the mail If you have any lab test that is abnormal or we need to change your treatment, we will call you to review the results.  Testing/Procedures: - Your physician has requested that you have a TEE. During a TEE, sound waves are used to create images of your heart. It provides your doctor with information about the size and shape of your heart and how well your heart's chambers and valves are working. In this test, a transducer is attached to the end of a flexible tube that's guided down your throat and into your esophagus (the tube leading from you mouth to your stomach) to get a more detailed image of your heart. You are not awake for the procedure.   You are scheduled for a Transesophageal echocardiogram (TEE) on Monday February 1st  with Dr. Fletcher Anon.  Please arrive at the [place] [facility] at 6:30 a.m. on the day of your procedure.  DIET INSTRUCTIONS:  Nothing to eat or drink after midnight the night before your procedure        1) Labs: as above  2) Medications:  You may take all of your regular medications the morning of your procedure unless listed below:   - Hold all insulin/ diabetic medications the morning of your procedure  3) Must have a responsible person to drive you home.  4) Bring a current list of your medications and  current insurance cards.    If you have any questions after you get home, please call the office at [phone]   Follow-Up: At [facility], you and your health needs are our priority.  As part of our continuing mission to provide you with exceptional heart care, we have created designated Provider Care Teams.  These Care Teams include your primary Cardiologist (physician) and Advanced Practice Providers (APPs -  Physician Assistants and Nurse Practitioners) who all work together to provide you with the care you need, when you need it.  Your next appointment:   1-2 week(s)  The format for your next appointment:   In Person  Provider:   Kathlyn Sacramento, MD  Other Instructions N/a   Transesophageal Echocardiogram Transesophageal echocardiogram (TEE) is a test that uses sound waves to take pictures of your heart. TEE is done by passing a flexible tube down the esophagus. The esophagus is the tube that carries food from the throat to the stomach. The pictures give detailed images of your heart. This can help your doctor see if there are problems with your heart.  General instructions  You will need to take out any dentures or retainers.  Plan to have someone take you home from the hospital or clinic.  If you will be going home right after the procedure, plan to have someone with you for 24 hours.  Ask your doctor  about: ? Changing or stopping your normal medicines. This is important if you take diabetes medicines or blood thinners. ? Taking over-the-counter medicines, vitamins, herbs, and supplements. ? Taking medicines such as aspirin and ibuprofen. These medicines can thin your blood. Do not take these medicines unless your doctor tells you to take them. What happens during the procedure?  To lower your risk of infection, your doctors will wash or clean their hands.  An IV will be put into one of your veins.  You will be given a medicine to help you relax (sedative).  A  medicine may be sprayed or gargled. This numbs the back of your throat.  Your blood pressure, heart rate, and breathing will be watched.  You may be asked to lay on your left side.  A bite block will be placed in your mouth. This keeps you from biting the tube.  The tip of the TEE probe will be placed into the back of your mouth.  You will be asked to swallow.  Your doctor will take pictures of your heart.  The probe and bite block will be taken out. The procedure may vary among doctors and hospitals. What happens after the procedure?   Your blood pressure, heart rate, breathing rate, and blood oxygen level will be watched until the medicines you were given have worn off.  When you first wake up, your throat may feel sore and numb. This will get better over time. You will not be allowed to eat or drink until the numbness has gone away.  Do not drive for 24 hours if you were given a medicine to help you relax. Summary  TEE is a test that uses sound waves to take pictures of your heart.  You will be given a medicine to help you relax.  Do not drive for 24 hours if you were given a medicine to help you relax. This information is not intended to replace advice given to you by your health care provider. Make sure you discuss any questions you have with your health care provider. Document Revised: 08/03/2018 Document Reviewed: 02/15/2017 Elsevier Patient Education  [place].

## 2019-12-30 ENCOUNTER — Encounter
Admission: RE | Disposition: A | Discharge status: Home / Self Care | Payer: Medicare Other | Source: Home / Self Care | Attending: Cardiovascular Disease

## 2019-12-30 ENCOUNTER — Ambulatory Visit
Admission: RE | Admit: 2019-12-30 | Discharge: 2019-12-30 | Disposition: A | Discharge status: Home / Self Care | Payer: Medicare Other | Attending: Cardiovascular Disease | Admitting: Cardiovascular Disease

## 2019-12-30 ENCOUNTER — Encounter: Payer: Self-pay | Admitting: Cardiovascular Disease

## 2019-12-30 ENCOUNTER — Other Ambulatory Visit: Payer: Self-pay

## 2019-12-30 ENCOUNTER — Ambulatory Visit (HOSPITAL_BASED_OUTPATIENT_CLINIC_OR_DEPARTMENT_OTHER)
Admission: RE | Admit: 2019-12-30 | Discharge: 2019-12-30 | Disposition: A | Discharge status: Home / Self Care | Payer: Medicare Other | Source: Home / Self Care | Attending: Family | Admitting: Family

## 2019-12-30 DIAGNOSIS — Z886 Allergy status to analgesic agent status: Secondary | ICD-10-CM | POA: Insufficient documentation

## 2019-12-30 DIAGNOSIS — R918 Other nonspecific abnormal finding of lung field: Secondary | ICD-10-CM | POA: Diagnosis not present

## 2019-12-30 DIAGNOSIS — I2510 Atherosclerotic heart disease of native coronary artery without angina pectoris: Secondary | ICD-10-CM | POA: Insufficient documentation

## 2019-12-30 DIAGNOSIS — I050 Rheumatic mitral stenosis: Secondary | ICD-10-CM

## 2019-12-30 DIAGNOSIS — K219 Gastro-esophageal reflux disease without esophagitis: Secondary | ICD-10-CM | POA: Insufficient documentation

## 2019-12-30 DIAGNOSIS — I129 Hypertensive chronic kidney disease with stage 1 through stage 4 chronic kidney disease, or unspecified chronic kidney disease: Secondary | ICD-10-CM | POA: Diagnosis not present

## 2019-12-30 DIAGNOSIS — E1122 Type 2 diabetes mellitus with diabetic chronic kidney disease: Secondary | ICD-10-CM | POA: Insufficient documentation

## 2019-12-30 DIAGNOSIS — E875 Hyperkalemia: Secondary | ICD-10-CM | POA: Insufficient documentation

## 2019-12-30 DIAGNOSIS — M1990 Unspecified osteoarthritis, unspecified site: Secondary | ICD-10-CM | POA: Diagnosis not present

## 2019-12-30 DIAGNOSIS — M797 Fibromyalgia: Secondary | ICD-10-CM | POA: Diagnosis not present

## 2019-12-30 DIAGNOSIS — E785 Hyperlipidemia, unspecified: Secondary | ICD-10-CM | POA: Diagnosis not present

## 2019-12-30 DIAGNOSIS — Z888 Allergy status to other drugs, medicaments and biological substances status: Secondary | ICD-10-CM | POA: Diagnosis not present

## 2019-12-30 DIAGNOSIS — Z794 Long term (current) use of insulin: Secondary | ICD-10-CM | POA: Insufficient documentation

## 2019-12-30 DIAGNOSIS — E781 Pure hyperglyceridemia: Secondary | ICD-10-CM | POA: Diagnosis not present

## 2019-12-30 DIAGNOSIS — R0609 Other forms of dyspnea: Secondary | ICD-10-CM | POA: Insufficient documentation

## 2019-12-30 DIAGNOSIS — F419 Anxiety disorder, unspecified: Secondary | ICD-10-CM | POA: Insufficient documentation

## 2019-12-30 DIAGNOSIS — I313 Pericardial effusion (noninflammatory): Secondary | ICD-10-CM | POA: Diagnosis not present

## 2019-12-30 DIAGNOSIS — K5790 Diverticulosis of intestine, part unspecified, without perforation or abscess without bleeding: Secondary | ICD-10-CM | POA: Diagnosis not present

## 2019-12-30 DIAGNOSIS — I080 Rheumatic disorders of both mitral and aortic valves: Secondary | ICD-10-CM | POA: Insufficient documentation

## 2019-12-30 DIAGNOSIS — I252 Old myocardial infarction: Secondary | ICD-10-CM | POA: Insufficient documentation

## 2019-12-30 DIAGNOSIS — Z79899 Other long term (current) drug therapy: Secondary | ICD-10-CM | POA: Insufficient documentation

## 2019-12-30 DIAGNOSIS — Z7951 Long term (current) use of inhaled steroids: Secondary | ICD-10-CM | POA: Diagnosis not present

## 2019-12-30 DIAGNOSIS — N184 Chronic kidney disease, stage 4 (severe): Secondary | ICD-10-CM | POA: Diagnosis not present

## 2019-12-30 DIAGNOSIS — Z9861 Coronary angioplasty status: Secondary | ICD-10-CM | POA: Insufficient documentation

## 2019-12-30 DIAGNOSIS — I342 Nonrheumatic mitral (valve) stenosis: Secondary | ICD-10-CM | POA: Diagnosis not present

## 2019-12-30 DIAGNOSIS — D472 Monoclonal gammopathy: Secondary | ICD-10-CM | POA: Insufficient documentation

## 2019-12-30 DIAGNOSIS — R0602 Shortness of breath: Secondary | ICD-10-CM | POA: Insufficient documentation

## 2019-12-30 HISTORY — PX: TEE WITHOUT CARDIOVERSION: SHX5443

## 2019-12-30 LAB — POTASSIUM (ARMC VASCULAR LAB ONLY): Potassium (ARMC vascular lab): 4.9 (ref 3.5–5.1)

## 2019-12-30 LAB — GLUCOSE, CAPILLARY
Glucose-Capillary: 68 mg/dL — ABNORMAL LOW (ref 70–99)
Glucose-Capillary: 121 mg/dL — ABNORMAL HIGH (ref 70–99)
Glucose-Capillary: 52 mg/dL — ABNORMAL LOW (ref 70–99)

## 2019-12-30 SURGERY — ECHOCARDIOGRAM, TRANSESOPHAGEAL
Anesthesia: Moderate Sedation

## 2019-12-30 MED ORDER — DEXTROSE 50 % IV SOLN
INTRAVENOUS | Status: AC
  Filled 2019-12-30: qty 50

## 2019-12-30 MED ORDER — LIDOCAINE VISCOUS HCL 2 % MT SOLN
OROMUCOSAL | Status: AC
  Filled 2019-12-30: qty 15

## 2019-12-30 MED ORDER — SODIUM CHLORIDE 0.9 % IV SOLN: INTRAVENOUS | Status: DC

## 2019-12-30 MED ORDER — DEXTROSE 50 % IV SOLN
12.5000 g | INTRAVENOUS | Status: AC
  Administered 2019-12-30: 12.5 g via INTRAVENOUS

## 2019-12-30 MED ORDER — MIDAZOLAM HCL 5 MG/5ML IJ SOLN
INTRAMUSCULAR | Status: AC | PRN
  Administered 2019-12-30: 2 mg via INTRAVENOUS

## 2019-12-30 MED ORDER — FENTANYL CITRATE (PF) 100 MCG/2ML IJ SOLN
INTRAMUSCULAR | Status: AC | PRN
  Administered 2019-12-30: 50 ug via INTRAVENOUS

## 2019-12-30 MED ORDER — MIDAZOLAM HCL 5 MG/5ML IJ SOLN
INTRAMUSCULAR | Status: AC
  Filled 2019-12-30: qty 5

## 2019-12-30 MED ORDER — SODIUM CHLORIDE FLUSH 0.9 % IV SOLN
INTRAVENOUS | Status: AC
  Filled 2019-12-30: qty 10

## 2019-12-30 MED ORDER — BUTAMBEN-TETRACAINE-BENZOCAINE 2-2-14 % EX AERO
INHALATION_SPRAY | CUTANEOUS | Status: AC
  Filled 2019-12-30: qty 5

## 2019-12-30 MED ORDER — FENTANYL CITRATE (PF) 100 MCG/2ML IJ SOLN
INTRAMUSCULAR | Status: AC
  Filled 2019-12-30: qty 2

## 2019-12-30 NOTE — Discharge Instructions (Signed)
NOTHING TO EAT OR DRINK UNTIL 9:05 DUE TO HAVING NUMBING MEDS TO THE THROAT   Transesophageal Echocardiogram Transesophageal echocardiogram (TEE) is a test that uses sound waves to take pictures of your heart. TEE is done by passing a flexible tube down the esophagus. The esophagus is the tube that carries food from the throat to the stomach. The pictures give detailed images of your heart. This can help your doctor see if there are problems with your heart. What happens before the procedure? Staying hydrated Follow instructions from your doctor about hydration, which may include:  Up to 3 hours before the procedure - you may continue to drink clear liquids, such as: ? Water. ? Clear fruit juice. ? Black coffee. ? Plain tea.  Eating and drinking Follow instructions from your doctor about eating and drinking, which may include:  8 hours before the procedure - stop eating heavy meals or foods such as meat, fried foods, or fatty foods.  6 hours before the procedure - stop eating light meals or foods, such as toast or cereal.  6 hours before the procedure - stop drinking milk or drinks that contain milk.  3 hours before the procedure - stop drinking clear liquids. General instructions  You will need to take out any dentures or retainers.  Plan to have someone take you home from the hospital or clinic.  If you will be going home right after the procedure, plan to have someone with you for 24 hours.  Ask your doctor about: ? Changing or stopping your normal medicines. This is important if you take diabetes medicines or blood thinners. ? Taking over-the-counter medicines, vitamins, herbs, and supplements. ? Taking medicines such as aspirin and ibuprofen. These medicines can thin your blood. Do not take these medicines unless your doctor tells you to take them. What happens during the procedure?  To lower your risk of infection, your doctors will wash or clean their hands.  An IV  will be put into one of your veins.  You will be given a medicine to help you relax (sedative).  A medicine may be sprayed or gargled. This numbs the back of your throat.  Your blood pressure, heart rate, and breathing will be watched.  You may be asked to lay on your left side.  A bite block will be placed in your mouth. This keeps you from biting the tube.  The tip of the TEE probe will be placed into the back of your mouth.  You will be asked to swallow.  Your doctor will take pictures of your heart.  The probe and bite block will be taken out. The procedure may vary among doctors and hospitals. What happens after the procedure?   Your blood pressure, heart rate, breathing rate, and blood oxygen level will be watched until the medicines you were given have worn off.  When you first wake up, your throat may feel sore and numb. This will get better over time. You will not be allowed to eat or drink until the numbness has gone away.  Do not drive for 24 hours if you were given a medicine to help you relax. Summary  TEE is a test that uses sound waves to take pictures of your heart.  You will be given a medicine to help you relax.  Do not drive for 24 hours if you were given a medicine to help you relax. This information is not intended to replace advice given to you by your health  care provider. Make sure you discuss any questions you have with your health care provider. Document Revised: 08/03/2018 Document Reviewed: 02/15/2017 Elsevier Patient Education  [place].   Mitral Valve Stenosis  Mitral valve stenosis is a narrowing of the mitral valve, which is the valve between the upper left chamber (left atrium) and the lower left chamber (left ventricle) of the heart. This condition can limit blood flow between the left atrium and the left ventricle. If you have this condition, your health care provider may hear an abnormal sound (heart murmur) while listening to  your heart. This condition can range from mild to severe. What are the causes? This condition may be caused by:  Rheumatic fever, which is a complication of a strep infection.  A buildup of calcium around the valve. This can occur with aging.  A problem that is present at birth (congenital defect).  Certain long-term (chronic) diseases. What are the signs or symptoms? Symptoms of this condition include:  Shortness of breath.  Cough.  Noisy breathing (wheezing).  Tiredness (fatigue) or decreased energy.  Fast or irregular heartbeats (palpitations).  Heart murmur.  Chest pain.  Pain in the arm, neck, jaw, or face.  Swollen feet or ankles.  Hoarse voice.  Pink and purple patches of skin on the face. How is this diagnosed? This condition may be diagnosed based on:  A physical exam. Your health care provider will check for a heart murmur by listening to your heart.  Tests, including: ? A test that creates ultrasound images of the heart that allow your health care provider to see how the heart valves work while your heart is beating (echocardiogram). ? A test that records the electrical impulses of the heart (electrocardiogram). ? Exercise stress tests. These are tests that evaluate the blood supply to your heart and your heart's response to exercise. ? A test to look at the structure and function of the heart (cardiac catheterization). A thin tube (catheter) is passed through the blood vessels and into the heart. Dye is injected into the blood vessels so the cardiac system can be seen on images that are taken. ? Chest X-rays. How is this treated? Treatment for this condition depends on the severity of the condition. It may include:  Medicines to keep the heart rate regular.  Medicines to control blood pressure.  Blood thinners (anticoagulants) to prevent blood clots.  Antibiotic medicines to prevent infections. Defective heart valves are more likely to become  infected.  A procedure in which a catheter is passed through a blood vessel to the heart and then a tiny balloon is inflated to open the mitral valve (percutaneous balloon valvuloplasty).  Open heart surgery to repair or replace the mitral valve. Follow these instructions at home: Medicines  Take over-the-counter and prescription medicines only as told by your health care provider.  If you were prescribed an antibiotic medicine, take it as told by your health care provider. Do not stop taking the antibiotic even if you start to feel better.  If you are taking blood thinners: ? Talk with your health care provider before you take any medicines that contain aspirin or NSAIDs. These medicines increase your risk for dangerous bleeding. ? Take your medicine exactly as told, at the same time every day. ? Avoid activities that could cause injury or bruising. ? Follow instructions about how to prevent falls. ? Wear a medical alert bracelet or carry a card that lists what medicines you take. Lifestyle  Do not  use any products that contain nicotine or tobacco, such as cigarettes, e-cigarettes, and chewing tobacco. If you need help quitting, ask your health care provider.  Achieve and maintain a healthy weight.  Ask your health care provider what kinds of exercise are safe for you.  If you are taking blood thinners, avoid activities that have a high risk of injury. Ask your health care provider what activities are safe for you. Eating and drinking  Limit your intake of caffeine and alcohol. Both of these substances can affect your heart's rate and rhythm.  If you drink alcohol: ? Limit how much you use to:  0-1 drink a day for nonpregnant women.  0-2 drinks a day for men. ? Be aware of how much alcohol is in your drink. In the [place] one drink equals one 12 oz bottle of beer (355 mL), one 5 oz glass of wine (148 mL), or one 1 oz glass of hard liquor (44 mL).  Eat a heart-healthy diet that  includes plenty of fresh fruits and vegetables, whole grains, low-fat (lean) proteins, and low-fat or nonfat dairy products. Consider working with a dietitian to help you make healthy food choices.  Limit the amount of salt (sodium) in your diet. Avoid adding salt to foods, and avoid foods that are high in salt, such as: ? Pickles. ? Smoked and cured meats. ? Processed foods.  General instructions  Before any dental procedures, tell your dentist that you have mitral valve stenosis. You may need antibiotics to prevent a heart infection.  If you plan to become pregnant, talk with your health care provider first.  Keep all follow-up visits as told by your health care provider. This is important. Contact a health care provider if you:  Have a fever.  Feel more tired than usual when doing physical activity.  Have a dry cough. Get help right away if you:  Have pain or pressure in your chest that does not go away.  Have difficulty breathing.  Have palpitations.  Have a sudden weight gain.  Have swelling in your feet, ankles, or legs.  Have trouble staying awake or you faint.  Feel confused. These symptoms may represent a serious problem that is an emergency. Do not wait to see if the symptoms will go away. Get medical help right away. Call your local emergency services (911 in the [place] Do not drive yourself to the hospital. Summary  Mitral valve stenosis is a narrowing of the mitral valve. This condition can limit blood flow on the left side of your heart.  Depending on how severe your condition is, you may be treated with medicines, a procedure, or surgery.  Practice heart-healthy habits to manage this condition. These include limiting alcohol, avoiding nicotine and tobacco, and eating a heart-healthy diet that is low in salt (sodium). This information is not intended to replace advice given to you by your health care provider. Make sure you discuss any questions you have with  your health care provider. Document Revised: 10/28/2018 Document Reviewed: 10/28/2018 Elsevier Patient Education  [place].   Moderate Conscious Sedation, Adult, Care After These instructions provide you with information about caring for yourself after your procedure. Your health care provider may also give you more specific instructions. Your treatment has been planned according to current medical practices, but problems sometimes occur. Call your health care provider if you have any problems or questions after your procedure. What can I expect after the procedure? After your procedure, it is common:  To feel sleepy for several hours.  To feel clumsy and have poor balance for several hours.  To have poor judgment for several hours.  To vomit if you eat too soon. Follow these instructions at home: For at least 24 hours after the procedure:   Do not: ? Participate in activities where you could fall or become injured. ? Drive. ? Use heavy machinery. ? Drink alcohol. ? Take sleeping pills or medicines that cause drowsiness. ? Make important decisions or sign legal documents. ? Take care of children on your own.  Rest. Eating and drinking  Follow the diet recommended by your health care provider.  If you vomit: ? Drink water, juice, or soup when you can drink without vomiting. ? Make sure you have little or no nausea before eating solid foods. General instructions  Have a responsible adult stay with you until you are awake and alert.  Take over-the-counter and prescription medicines only as told by your health care provider.  If you smoke, do not smoke without supervision.  Keep all follow-up visits as told by your health care provider. This is important. Contact a health care provider if:  You keep feeling nauseous or you keep vomiting.  You feel light-headed.  You develop a rash.  You have a fever. Get help right away if:  You have trouble  breathing. This information is not intended to replace advice given to you by your health care provider. Make sure you discuss any questions you have with your health care provider. Document Revised: 10/27/2017 Document Reviewed: 03/05/2016 Elsevier Patient Education  2020 [employer].

## 2019-12-30 NOTE — Progress Notes (Signed)
*  PRELIMINARY RESULTS* Echocardiogram Echocardiogram Transesophageal has been performed.  Sherrie Sport 12/30/2019, 8:10 AM

## 2019-12-30 NOTE — Interval H&P Note (Signed)
History and Physical Interval Note:  12/30/2019 8:25 AM  Caitlin Bass  has presented today for surgery, with the diagnosis of TEE    Mitral stenosis.  The various methods of treatment have been discussed with the patient and family. After consideration of risks, benefits and other options for treatment, the patient has consented to  Procedure(s): TRANSESOPHAGEAL ECHOCARDIOGRAM (TEE) (N/A) as a surgical intervention.  The patient's history has been reviewed, patient examined, no change in status, stable for surgery.  I have reviewed the patient's chart and labs.  Questions were answered to the patient's satisfaction.     Kathlyn Sacramento

## 2019-12-30 NOTE — CV Procedure (Signed)
A transesophageal echocardiogram was done without complications. See TEE report.  During this procedure the patient is administered a total of Versed 2 mg and Fentanyl 50 mcg to achieve and maintain moderate conscious sedation.  The patient's heart rate, blood pressure, and oxygen saturation are monitored continuously during the procedure. The period of conscious sedation is 15 minutes, of which I was present face-to-face 100% of this time.

## 2020-01-02 ENCOUNTER — Telehealth: Payer: Self-pay | Admitting: Cardiovascular Disease

## 2020-01-02 NOTE — Telephone Encounter (Signed)
   [facility] Pre-operative Risk Assessment    Request for surgical clearance:  1. What type of surgery is being performed? Colonoscopy   2. When is this surgery scheduled? 04/06/20  3. What type of clearance is required (medical clearance vs. Pharmacy clearance to hold med vs. Both)? both  4. Are there any medications that need to be held prior to surgery and how long? Please advise on anticoagulant holding protocol if needed  5. Practice name and name of physician performing surgery? [facility], [place], [place]   6. What is your office phone number  [phone]   7.   What is your office fax number [phone]  8.   Anesthesia type (None, local, MAC, general) ? Monitored    Ace Gins 01/02/2020, 4:52 PM  _________________________________________________________________   (provider comments below)

## 2020-01-03 ENCOUNTER — Telehealth: Payer: Self-pay | Admitting: *Deleted

## 2020-01-03 NOTE — Telephone Encounter (Signed)
Pt is undergoing eval for mitral stenosis, and had intermediate risk MV 09/2019  She has f/u appt on 02/12.   Colonoscopy scheduled for 05/10/20201.  Contacted pt by phone, advised we would address cardiac clearance in April, since procedure not scheduled till 04/06/2020.   **Contact pt after 03/12/2020 for cardiac clearance for colonoscopy.**  Rhonda Barrett, PA-C 01/03/2020 11:21 AM

## 2020-01-03 NOTE — Telephone Encounter (Signed)
Received incoming referral from [facility]- requesting to r/s the patient's f/u.  Attempted to reach patient via home phone - no answer. Left vm on pt's cell phone - requesting a return phone call to schedule an apt with Dr. Rogue Bussing.

## 2020-01-06 NOTE — Telephone Encounter (Signed)
Per Eldridge Abrahams - patient returned phone call and agreeable to sch. Apts.

## 2020-01-10 ENCOUNTER — Ambulatory Visit (INDEPENDENT_AMBULATORY_CARE_PROVIDER_SITE_OTHER): Payer: Medicare Other | Admitting: Family

## 2020-01-10 ENCOUNTER — Encounter: Payer: Self-pay | Admitting: Family

## 2020-01-10 ENCOUNTER — Other Ambulatory Visit: Payer: Self-pay

## 2020-01-10 VITALS — BP 140/68 | HR 83 | Ht 63.0 in | Wt 161.5 lb

## 2020-01-10 DIAGNOSIS — I050 Rheumatic mitral stenosis: Secondary | ICD-10-CM

## 2020-01-10 DIAGNOSIS — R0600 Dyspnea, unspecified: Secondary | ICD-10-CM

## 2020-01-10 DIAGNOSIS — I25118 Atherosclerotic heart disease of native coronary artery with other forms of angina pectoris: Secondary | ICD-10-CM | POA: Diagnosis not present

## 2020-01-10 DIAGNOSIS — Z720 Tobacco use: Secondary | ICD-10-CM

## 2020-01-10 DIAGNOSIS — N184 Chronic kidney disease, stage 4 (severe): Secondary | ICD-10-CM | POA: Diagnosis not present

## 2020-01-10 DIAGNOSIS — I10 Essential (primary) hypertension: Secondary | ICD-10-CM

## 2020-01-10 DIAGNOSIS — R0609 Other forms of dyspnea: Secondary | ICD-10-CM

## 2020-01-10 MED ORDER — AMLODIPINE BESYLATE 10 MG PO TABS: 10.0000 mg | ORAL_TABLET | Freq: Every day | ORAL | 1 refills | Status: AC

## 2020-01-10 NOTE — Patient Instructions (Addendum)
Medication Instructions:  1- INCREASE Amlodipine to 1 tablet ( 10 mg total) once daily *If you need a refill on your cardiac medications before your next appointment, please call your pharmacy*  Lab Work: None ordered  If you have labs (blood work) drawn today and your tests are completely normal, you will receive your results only by: Marland Kitchen MyChart Message (if you have MyChart) OR . A paper copy in the mail If you have any lab test that is abnormal or we need to change your treatment, we will call you to review the results.  Testing/Procedures: None ordered   Follow-Up: At [facility], you and your health needs are our priority.  As part of our continuing mission to provide you with exceptional heart care, we have created designated Provider Care Teams.  These Care Teams include your primary Cardiologist (physician) and Advanced Practice Providers (APPs -  Physician Assistants and Nurse Practitioners) who all work together to provide you with the care you need, when you need it.  Your next appointment:  2 months with Dr. Fletcher Anon or App

## 2020-01-10 NOTE — Progress Notes (Signed)
Office Visit    Patient Name: Caitlin Bass Date of Encounter: 01/10/2020  Primary Care Provider:  Marguerita Merles, MD Primary Cardiologist:  Kathlyn Sacramento, MD Electrophysiologist:  None   Chief Complaint    Caitlin Bass is a 69 y.o. female with a hx of moderate mitral stenosis, tobacco abuse, anemiaDM2, HTN, CAD s/p PCI, CKDIV, MGUS, hyperkalemia, pulmonary nodules  presents today for follow up after TEE   Past Medical History    Past Medical History:  Diagnosis Date  . Anemia   . Anxiety   . Arthritis   . CKD (chronic kidney disease), stage IV ([place])   . Coronary artery disease    a. 03/2014 NSTEMI/Cath: LM nl, LAD 57m, 60d, D1 60, D2 nl, d3 40, LCX 100 w/ L->L collats, RCA non dom, min irregs, EF 55%-->Med Rx; b. 09/2019 MV: EF 30-44%, large basal inf, basal inflat, mid inf, mid inflat, apical defect - partially reversible.  . DDD (degenerative disc disease)    a. s/p upper back surgery spring 2015.  . Diabetes mellitus without complication ([place])   . Diverticulosis   . Fibromyalgia   . GERD (gastroesophageal reflux disease)   . Hiatal hernia   . Hyperlipidemia   . Hypertensive heart disease    a. 03/2015 Echo: 55-60%, mild conc LVH; b. 10/2019 Echo: EF 65-70%, mod LVH.  . Mitral stenosis    a. 10/2019 Echo: EF 65-70%, mod LVH. No rwma. Mildly dil LA. MV Ca2+ w/ mod-sev MS (mean grad 85mmHg, Valve area 1.1-1.2). Mild to mod AoV sclerosis w/o stenosis.  . Syncope and collapse    a. Early 03/2014->did not seek medical attn.  . Tobacco abuse    Past Surgical History:  Procedure Laterality Date  . BREAST CYST ASPIRATION Right 2005   NEG  . CARDIAC CATHETERIZATION  06/2005   [facility]  . CARDIAC CATHETERIZATION  04/2014   [facility]  . COLONOSCOPY WITH PROPOFOL N/A 04/27/2016   Procedure: COLONOSCOPY WITH PROPOFOL;  Surgeon: Manya Silvas, MD;  Location: [facility] ENDOSCOPY;  Service: Endoscopy;  Laterality: N/A;  . CORONARY ANGIOPLASTY    . DILATATION &  CURETTAGE/HYSTEROSCOPY WITH MYOSURE N/A 09/18/2015   Procedure: DILATATION & CURETTAGE/HYSTEROSCOPY WITH MYOSURE;  Surgeon: Boykin Nearing, MD;  Location: [facility] ORS;  Service: Gynecology;  Laterality: N/A;  . ESOPHAGOGASTRODUODENOSCOPY (EGD) WITH PROPOFOL N/A 04/27/2016   Procedure: ESOPHAGOGASTRODUODENOSCOPY (EGD) WITH PROPOFOL;  Surgeon: Manya Silvas, MD;  Location: [facility] ENDOSCOPY;  Service: Endoscopy;  Laterality: N/A;  . NECK SURGERY     Degenerative Disk Disease and removal of a spinal cyst  . TEE WITHOUT CARDIOVERSION N/A 12/30/2019   Procedure: TRANSESOPHAGEAL ECHOCARDIOGRAM (TEE);  Surgeon: Wellington Hampshire, MD;  Location: [facility] ORS;  Service: Cardiovascular;  Laterality: N/A;  . TUBAL LIGATION      Allergies  Allergies  Allergen Reactions  . Aspirin Other (See Comments)    "pt. has a history of ulcers" Stomach hurts  . Hydrochlorothiazide Itching    History of Present Illness    Caitlin Bass is a 69 y.o. female with a hx of moderate mitral stenosis, tobacco abuse, anemiaDM2, HTN, CAD s/p PCI, CKDIV, MGUS, hyperkalemia, pulmonary nodules    last seen for TEE 12/30/19.  Her CAD is s/p NSTEMI May 2015 with catheterization showing occluded L Cx with left to left collaterals and otherwise nonobstructive disease. Echo with normal LVEF. Echo November 2019 with hyperdynamic LV systolic function, severe LVH, and trivial pericardial effusion. Seen Servando Snare  2020 with progressive dyspnea without chest pain. Stress test showed prior inferior/inferolateral infarct with mild peri-infarct ischemia and EF 44%. Due to CKD IV, continued medical therapy was recommended by Dr. Fletcher Anon during office visit 10/10/19. Subsequent echo with LVEF 65-70%, moderate LVH, newly noted moderate to severe mitral stenosis with mean gradient of 78mmHg and valve area 1.1-1.2.   ED visit 12/26/19 for hyperkalemia. Was sent by her nephrologist for K 6.8, at ED K 5.9, the 5.6 after treatment with  insulin/dextrose/veltassa.   She underwent TEE 12/30/19 for further clarification of her MR showing moderate mitral stenosis.    Reports overall feeling well.  Reports no chest pain, pressure, tightness.  Reports no shortness of breath at rest.  Does report continued dyspnea on exertion.  Tells me this is stable at her baseline, possibly a little bit better after completing antibiotics prescribed by PCP. No lower extremity edema, orthopnea, PND.  Her chief complaint today is "ear pain ".  Tells me she feels like she might have some congestion.  Recommended she follow-up with her primary care provider.  She tells me she recently finished an antibiotic and thinks she may need more.  We reviewed her TEE and moderate mitral stenosis in depth.  We discussed the need for optimization of blood pressure.  She does not check her blood pressure at home-does have a cuff but is unsure where it is.  EKGs/Labs/Other Studies Reviewed:   The following studies were reviewed today:  TEE 12/30/19  1. Left ventricular ejection fraction, by visual estimation, is 60 to  65%. The left ventricle has normal function. There is no left ventricular  hypertrophy.   2. Left ventricular diastolic parameters are consistent with Grade I  diastolic dysfunction (impaired relaxation).   3. Global right ventricle has normal systolic function.The right  ventricular size is normal. No increase in right ventricular wall  thickness.   4. Left atrial size was mildly dilated.   5. Right atrial size was mildly dilated.   6. Moderate calcification of the mitral valve leaflet(s).   7. Moderately decreased mobility of the mitral valve leaflets.   8. The mitral valve is abnormal. No evidence of mitral valve  regurgitation. Moderate mitral stenosis. Valve area by pressure half time  is 1.6. Mean gradient was 9 mm Hg and peak gradient was 17 mm Hg.   9. The tricuspid valve is normal in structure. Tricuspid valve  regurgitation is not  demonstrated.  10. The aortic valve is tricuspid. Aortic valve regurgitation is trivial.  Mild aortic valve sclerosis without stenosis.  11. The pulmonic valve was normal in structure. Pulmonic valve  regurgitation is not visualized.  12. TR signal is inadequate for assessing pulmonary artery systolic  pressure.  13. The left ventricle has no regional wall motion abnormalities.   Echo 11/18/19  1. Left ventricular ejection fraction, by visual estimation, is 65 to 70%. The left ventricle has normal function. There is moderately increased left ventricular hypertrophy.  2. Left ventricular diastolic parameters are indeterminate.  3. The left ventricle has no regional wall motion abnormalities.  4. Global right ventricle has normal systolic function.The right ventricular size is normal. No increase in right ventricular wall thickness.  5. Left atrial size was mildly dilated.  6. Right atrial size was normal.  7. The pericardial effusion is posterior to the left ventricle.  8. Trivial pericardial effusion is present.  9. Severe calcification of the mitral valve leaflet(s). 10. Severe mitral annular calcification. 11. Severe thickening of  the mitral valve leaflet(s). 12. The mitral valve is abnormal. No evidence of mitral valve regurgitation. Moderate-severe mitral stenosis. Mean gradient is 12 mm Hg with valve area of 1.1 to 1.2. 13. The tricuspid valve is normal in structure. Tricuspid valve regurgitation is not demonstrated. 14. The aortic valve is normal in structure. Aortic valve regurgitation is not visualized. Mild to moderate aortic valve sclerosis/calcification without any evidence of aortic stenosis. 15. The pulmonic valve was normal in structure. Pulmonic valve regurgitation is trivial. 16. TR signal is inadequate for assessing pulmonary artery systolic pressure. 17. The inferior vena cava is normal in size with greater than 50% respiratory variability, suggesting right atrial pressure  of 3 mmHg.    Recent Labs: 11/08/2019: ALT 19 12/26/2019: Hemoglobin 9.6; Platelets 313 12/27/2019: BUN 39; Creatinine, Ser 3.78; Potassium 5.6; Sodium 138  Recent Lipid Panel    Component Value Date/Time   CHOL 142 11/08/2019 1538   CHOL 291 (H) 07/23/2013 0429   TRIG 437 (H) 11/08/2019 1538   TRIG 550 (H) 07/23/2013 0429   HDL 33 (L) 11/08/2019 1538   HDL 37 (L) 07/23/2013 0429   CHOLHDL 4.3 11/08/2019 1538   VLDL UNABLE TO CALCULATE IF TRIGLYCERIDE OVER 400 mg/dL 11/08/2019 1538   VLDL SEE COMMENT 07/23/2013 0429   LDLCALC UNABLE TO CALCULATE IF TRIGLYCERIDE OVER 400 mg/dL 11/08/2019 1538   LDLCALC SEE COMMENT 07/23/2013 0429   LDLDIRECT 48.6 11/08/2019 1538    Home Medications   No outpatient medications have been marked as taking for the 01/10/20 encounter (Appointment) with Loel Dubonnet, NP.    Review of Systems      Review of Systems  Constitution: Negative for chills, fever and malaise/fatigue.  Cardiovascular: Positive for dyspnea on exertion. Negative for chest pain, leg swelling, near-syncope, orthopnea, palpitations and syncope.  Respiratory: Negative for cough, shortness of breath and wheezing.   Gastrointestinal: Negative for nausea and vomiting.  Neurological: Negative for dizziness, light-headedness and weakness.   All other systems reviewed and are otherwise negative except as noted above.  Physical Exam    VS:  There were no vitals taken for this visit. , BMI There is no height or weight on file to calculate BMI. GEN: Well nourished, overweight, well developed, in no acute distress. HEENT: normal. Neck: Supple, no JVD, carotid bruits, or masses. Cardiac: RRR, no  rubs, or gallops. GR 2/6 systolic murmur. No clubbing, cyanosis, edema.  Radials/DP/PT 2+ and equal bilaterally.  Respiratory:  Respirations regular and unlabored, clear to auscultation bilaterally. GI: Soft, nontender, nondistended, BS + x 4. MS: No deformity or atrophy. Skin: Warm and  dry, no rash. Neuro:  Strength and sensation are intact. Psych: Normal affect.  Accessory Clinical Findings    ECG personally reviewed by me today - SR 83 bpm with left axis deviation and stable TWI in aVR, aVL - no acute changes.  Assessment & Plan    1. Moderate mitral stenosis by TEE 12/2019 - Euvolemic on exam today. Discussed importance of BP control, lipid control, and volume management. Increase Amlodipine for optimization of BP, as below.   2. Hyperkalemia - Follows with nephrology. Recent K 4.9.   3. DOE -stable at baseline.  Etiology multifactorial current tobacco abuse, deconditioning, moderate mitral stenosis.  Recent intermediate stress testing suggesting inferior and inferolateral infarct with mild peri-infarct ischemia.  Echo 11/17/2019 with normal LVEF.  TEE 12/2018 with moderate mitral stenosis.  Recent CBC with hemoglobin 9.6-likely anemia of chronic disease.  Overall stable at her baseline  and no indication for further work-up at this time.  4. CAD -known circumflex lesion by cath 2015 with recent intermittent stress test showing inferior and inferior lateral infarct with mild peri-infarct ischemia.  GDMT includes aspirin, beta-blocker, statin.  Further work-up via cardiac catheterization limited by CKD.  As her mitral stenosis is moderate and not severe no indication for repeat catheterization at this time.  5. HTN - BP mildly elevated.  We will increase her amlodipine to 10 mg daily, prescription provided.  Continue carvedilol 12.5 mg twice daily.  Lisinopril discontinued by nephrologist 12/26/2019 due to hyperkalemia.  Encouraged her to check her blood pressure at home.  6. CKDIV - Follows with nephrology  7. HLD/hypertriglyceridemia -recent LDL 48.6, triglyceride 437.  Continue high intensity statin.  She will continue her omega-3.  Consider addition of Vascepa at follow-up. She has some confusion regarding medications and dosages today, hesitant to make multiple medication  changes during one office visit.  8. Pulmonary nodule -follows with PCP.  Recent CT chest shows decrease in bilateral pulmonary nodule.  Radiology recommends 63-month follow-up.  9. Tobacco abuse - Smoking cessation encouraged. Recommend utilization of [id number].  Disposition: Follow up in 2 month(s) with Dr. Fletcher Anon for monitoring of BP. Consider Vascepa.  Loel Dubonnet, NP 01/10/2020, 12:24 PM

## 2020-01-14 ENCOUNTER — Other Ambulatory Visit: Payer: Self-pay

## 2020-01-14 DIAGNOSIS — E782 Mixed hyperlipidemia: Secondary | ICD-10-CM

## 2020-01-14 MED ORDER — ROSUVASTATIN CALCIUM 40 MG PO TABS: 40.0000 mg | ORAL_TABLET | Freq: Every day | ORAL | 3 refills | Status: DC

## 2020-01-24 ENCOUNTER — Other Ambulatory Visit: Payer: Self-pay

## 2020-01-24 ENCOUNTER — Inpatient Hospital Stay: Payer: Medicare Other

## 2020-01-24 ENCOUNTER — Inpatient Hospital Stay: Payer: Medicare Other | Attending: Internal Medicine | Admitting: Internal Medicine

## 2020-01-24 DIAGNOSIS — R5383 Other fatigue: Secondary | ICD-10-CM | POA: Insufficient documentation

## 2020-01-24 DIAGNOSIS — N184 Chronic kidney disease, stage 4 (severe): Secondary | ICD-10-CM | POA: Insufficient documentation

## 2020-01-24 DIAGNOSIS — D508 Other iron deficiency anemias: Secondary | ICD-10-CM

## 2020-01-24 DIAGNOSIS — R0602 Shortness of breath: Secondary | ICD-10-CM | POA: Diagnosis not present

## 2020-01-24 DIAGNOSIS — D631 Anemia in chronic kidney disease: Secondary | ICD-10-CM

## 2020-01-24 LAB — COMPREHENSIVE METABOLIC PANEL
ALT: 23 U/L (ref 0–44)
AST: 17 U/L (ref 15–41)
Albumin: 3.8 g/dL (ref 3.5–5.0)
Alkaline Phosphatase: 86 U/L (ref 38–126)
Anion gap: 11 (ref 5–15)
BUN: 48 mg/dL — ABNORMAL HIGH (ref 8–23)
CO2: 18 mmol/L — ABNORMAL LOW (ref 22–32)
Calcium: 8.9 mg/dL (ref 8.9–10.3)
Chloride: 112 mmol/L — ABNORMAL HIGH (ref 98–111)
Creatinine, Ser: 3.87 mg/dL — ABNORMAL HIGH (ref 0.44–1.00)
GFR calc Af Amer: 13 mL/min — ABNORMAL LOW (ref >60)
GFR calc non Af Amer: 11 mL/min — ABNORMAL LOW (ref >60)
Glucose, Bld: 152 mg/dL — ABNORMAL HIGH (ref 70–99)
Potassium: 5.3 mmol/L — ABNORMAL HIGH (ref 3.5–5.1)
Sodium: 141 mmol/L (ref 135–145)
Total Bilirubin: 0.3 mg/dL (ref 0.3–1.2)
Total Protein: 7.1 g/dL (ref 6.5–8.1)

## 2020-01-24 LAB — CBC WITH DIFFERENTIAL/PLATELET
Abs Immature Granulocytes: 0.04 10*3/uL (ref 0.00–0.07)
Basophils Absolute: 0 10*3/uL (ref 0.0–0.1)
Basophils Relative: 1 %
Eosinophils Absolute: 0.1 10*3/uL (ref 0.0–0.5)
Eosinophils Relative: 1 %
HCT: 30.2 % — ABNORMAL LOW (ref 36.0–46.0)
Hemoglobin: 9.2 g/dL — ABNORMAL LOW (ref 12.0–15.0)
Immature Granulocytes: 1 %
Lymphocytes Relative: 31 %
Lymphs Abs: 1.9 10*3/uL (ref 0.7–4.0)
MCH: 27.6 pg (ref 26.0–34.0)
MCHC: 30.5 g/dL (ref 30.0–36.0)
MCV: 90.7 fL (ref 80.0–100.0)
Monocytes Absolute: 0.6 10*3/uL (ref 0.1–1.0)
Monocytes Relative: 9 %
Neutro Abs: 3.6 10*3/uL (ref 1.7–7.7)
Neutrophils Relative %: 57 %
Platelets: 324 10*3/uL (ref 150–400)
RBC: 3.33 MIL/uL — ABNORMAL LOW (ref 3.87–5.11)
RDW: 15.7 % — ABNORMAL HIGH (ref 11.5–15.5)
WBC: 6.3 10*3/uL (ref 4.0–10.5)
nRBC: 0 % (ref 0.0–0.2)

## 2020-01-24 LAB — FERRITIN: Ferritin: 254 ng/mL (ref 11–307)

## 2020-01-24 LAB — IRON AND TIBC
Iron: 60 ug/dL (ref 28–170)
Saturation Ratios: 22 % (ref 10.4–31.8)
TIBC: 273 ug/dL (ref 250–450)
UIBC: 213 ug/dL

## 2020-01-24 MED ORDER — SODIUM CHLORIDE 0.9 % IV SOLN
510.0000 mg | Freq: Once | INTRAVENOUS | Status: AC
  Administered 2020-01-24: 510 mg via INTRAVENOUS
  Filled 2020-01-24: qty 17

## 2020-01-24 MED ORDER — SODIUM CHLORIDE 0.9 % IV SOLN
Freq: Once | INTRAVENOUS | Status: AC
  Filled 2020-01-24: qty 250

## 2020-01-24 NOTE — Assessment & Plan Note (Addendum)
#   Anemia Iron deficiency- hemoglobin today 9.2;   On PO iron once a day.  Worsening/ symptomatic. Proceed with IV venofer. Awaiting iron studies from today.    # We discussed the need for Retacrit moving going forward.  We will consider at next visit.  Ordered.  # CKD-stage IV creatinine-3.8-slow worsening overall stable.  # DISPOSITION: # Ferrahem today. # Follow-up 1 months/MD-labs CBC BMP- possible retacrit- Dr.B

## 2020-01-24 NOTE — Progress Notes (Signed)
[place] OFFICE PROGRESS NOTE  Patient Care Team: Marguerita Merles, MD as PCP - General (Family Medicine) Wellington Hampshire, MD as PCP - Cardiology (Cardiology)   SUMMARY OF ONCOLOGIC HISTORY:  # March 2017- Anemia- Hb 8; plan Iv iron s/p IV iron x2 [[april 2017]; [place] May 2017.   # CKD [? nephrologist as per pt; ? Dr.Swain]/ CAD  # AUG 2016- ELEVATED K/L ratio 1.99; SPEP- Neg.    INTERVAL HISTORY:  69 year-old pleasant female patient with above history of anemia secondary to iron deficiency/CKD.  Patient complains of worsening fatigue.  Complains of shortness of breath on exertion.  Denies any blood in stools or black or stools.   Review of Systems  Constitutional: Positive for malaise/fatigue. Negative for chills, diaphoresis, fever and weight loss.  HENT: Negative for nosebleeds and sore throat.   Eyes: Negative for double vision.  Respiratory: Positive for shortness of breath. Negative for cough, hemoptysis, sputum production and wheezing.   Cardiovascular: Negative for chest pain, palpitations, orthopnea and leg swelling.  Gastrointestinal: Negative for abdominal pain, blood in stool, constipation, diarrhea, heartburn, melena, nausea and vomiting.  Genitourinary: Negative for dysuria, frequency and urgency.  Musculoskeletal: Negative for back pain and joint pain.  Skin: Negative.  Negative for itching and rash.  Neurological: Negative for dizziness, tingling, focal weakness, weakness and headaches.  Endo/Heme/Allergies: Does not bruise/bleed easily.  Psychiatric/Behavioral: Negative for depression. The patient is not nervous/anxious and does not have insomnia.      PAST MEDICAL HISTORY :  Past Medical History:  Diagnosis Date  . Anemia   . Anxiety   . Arthritis   . CKD (chronic kidney disease), stage IV ([place])   . Coronary artery disease    a. 03/2014 NSTEMI/Cath: LM nl, LAD 66m, 60d, D1 60, D2 nl, d3 40, LCX 100 w/ L->L collats, RCA non  dom, min irregs, EF 55%-->Med Rx; b. 09/2019 MV: EF 30-44%, large basal inf, basal inflat, mid inf, mid inflat, apical defect - partially reversible.  . DDD (degenerative disc disease)    a. s/p upper back surgery spring 2015.  . Diabetes mellitus without complication ([place])   . Diverticulosis   . Fibromyalgia   . GERD (gastroesophageal reflux disease)   . Hiatal hernia   . Hyperlipidemia   . Hypertensive heart disease    a. 03/2015 Echo: 55-60%, mild conc LVH; b. 10/2019 Echo: EF 65-70%, mod LVH.  . Mitral stenosis    a. 10/2019 Echo: EF 65-70%, mod LVH. No rwma. Mildly dil LA. MV Ca2+ w/ mod-sev MS (mean grad 68mmHg, Valve area 1.1-1.2). Mild to mod AoV sclerosis w/o stenosis.  . Syncope and collapse    a. Early 03/2014->did not seek medical attn.  . Tobacco abuse     PAST SURGICAL HISTORY :   Past Surgical History:  Procedure Laterality Date  . BREAST CYST ASPIRATION Right 2005   NEG  . CARDIAC CATHETERIZATION  06/2005   [facility]  . CARDIAC CATHETERIZATION  04/2014   [facility]  . COLONOSCOPY WITH PROPOFOL N/A 04/27/2016   Procedure: COLONOSCOPY WITH PROPOFOL;  Surgeon: Manya Silvas, MD;  Location: [facility] ENDOSCOPY;  Service: Endoscopy;  Laterality: N/A;  . CORONARY ANGIOPLASTY    . DILATATION & CURETTAGE/HYSTEROSCOPY WITH MYOSURE N/A 09/18/2015   Procedure: DILATATION & CURETTAGE/HYSTEROSCOPY WITH MYOSURE;  Surgeon: Boykin Nearing, MD;  Location: [facility] ORS;  Service: Gynecology;  Laterality: N/A;  . ESOPHAGOGASTRODUODENOSCOPY (EGD) WITH PROPOFOL N/A 04/27/2016   Procedure: ESOPHAGOGASTRODUODENOSCOPY (  EGD) WITH PROPOFOL;  Surgeon: Manya Silvas, MD;  Location: [facility] ENDOSCOPY;  Service: Endoscopy;  Laterality: N/A;  . NECK SURGERY     Degenerative Disk Disease and removal of a spinal cyst  . TEE WITHOUT CARDIOVERSION N/A 12/30/2019   Procedure: TRANSESOPHAGEAL ECHOCARDIOGRAM (TEE);  Surgeon: Wellington Hampshire, MD;  Location: [facility] ORS;  Service: Cardiovascular;  Laterality: N/A;  .  TUBAL LIGATION      FAMILY HISTORY :   Family History  Problem Relation Age of Onset  . Heart disease Mother   . Heart attack Mother   . Hypertension Mother   . Hyperlipidemia Mother   . Breast cancer Sister     SOCIAL HISTORY:   Social History   Tobacco Use  . Smoking status: Current Every Day Smoker    Packs/day: 0.25    Years: 40.00    Pack years: 10.00    Types: Cigarettes  . Smokeless tobacco: Never Used  Substance Use Topics  . Alcohol use: No  . Drug use: No    ALLERGIES:  is allergic to aspirin and hydrochlorothiazide.  MEDICATIONS:  Current Outpatient Medications  Medication Sig Dispense Refill  . allopurinol (ZYLOPRIM) 100 MG tablet Take 100 mg by mouth 2 (two) times daily.    Marland Kitchen amLODipine (NORVASC) 10 MG tablet Take 1 tablet (10 mg total) by mouth daily. 90 tablet 1  . Calcium Carbonate-Vitamin D (CALCIUM 600+D) 600-400 MG-UNIT tablet Take 1 tablet by mouth daily.    . carvedilol (COREG) 12.5 MG tablet Take 1 tablet (12.5 mg total) by mouth 2 (two) times daily. 180 tablet 0  . cetirizine (ZYRTEC) 10 MG tablet Take 10 mg by mouth at bedtime.     . cholecalciferol (VITAMIN D) 25 MCG (1000 UT) tablet Take 1,000 Units by mouth daily.    . colchicine 0.6 MG tablet Take 0.6 mg by mouth daily as needed (gout flare).     . ferrous sulfate 325 (65 FE) MG tablet Take 325 mg by mouth every other day.    . fluticasone (FLONASE) 50 MCG/ACT nasal spray Place 1 spray into both nostrils at bedtime.     Marland Kitchen GLUCOSAMINE HCL PO Take 1 tablet by mouth daily.    Marland Kitchen HUMALOG KWIKPEN 100 UNIT/ML KwikPen Inject 8-28 Units into the skin See admin instructions. Inject 8 units subcutaneously in the morning, 28 units with lunch & 28 units with supper    . LANTUS SOLOSTAR 100 UNIT/ML Solostar Pen Inject 40-44 Units into the skin See admin instructions. Inject 40 units subcutaneously in the morning & inject 44 units subcutaneously at night.    . linaclotide (LINZESS) 72 MCG capsule Take 72 mcg  by mouth every other day.     . magnesium oxide (MAG-OX) 400 MG tablet Take 400 mg by mouth 3 (three) times daily.     . Multiple Vitamin (MULTIVITAMIN WITH MINERALS) TABS tablet Take 1 tablet by mouth daily.    . Olopatadine HCl (PATADAY) 0.2 % SOLN Place 1 drop into both eyes 2 (two) times daily as needed (allergy eyes.).    Marland Kitchen Omega-3 Fatty Acids (FISH OIL MAXIMUM STRENGTH) 1200 MG CAPS Take 1,000 mg by mouth 3 (three) times daily.   0  . pantoprazole (PROTONIX) 20 MG tablet Take 20 mg by mouth 2 (two) times daily.   0  . rosuvastatin (CRESTOR) 40 MG tablet Take 1 tablet (40 mg total) by mouth daily. 90 tablet 3  . sodium bicarbonate 650 MG tablet Take 650  mg by mouth 3 (three) times daily.    . sodium polystyrene (KAYEXALATE) 15 GM/60ML suspension Take 60 g by mouth once.    . SYMBICORT 160-4.5 MCG/ACT inhaler Inhale 2 puffs into the lungs 2 (two) times daily.    Marland Kitchen tiZANidine (ZANAFLEX) 4 MG tablet Take 4 mg by mouth 3 (three) times daily as needed for muscle spasms.    Marland Kitchen triamcinolone cream (KENALOG) 0.1 % Apply 1 application topically 2 (two) times daily as needed (skin rash/irritation).      No current facility-administered medications for this visit.    PHYSICAL EXAMINATION:   BP (!) 157/71 (BP Location: Right Arm, Patient Position: Sitting)   Pulse 81   Temp (!) 97.5 F (36.4 C) (Tympanic)   Resp 20   Wt 162 lb (73.5 kg)   SpO2 100%   BMI 28.70 kg/m   Filed Weights   01/24/20 1325  Weight: 162 lb (73.5 kg)    Physical Exam  Constitutional: She is oriented to person, place, and time and well-developed, well-nourished, and in no distress.  HENT:  Head: Normocephalic and atraumatic.  Mouth/Throat: Oropharynx is clear and moist. No oropharyngeal exudate.  Eyes: Pupils are equal, round, and reactive to light.  Cardiovascular: Normal rate and regular rhythm.  Pulmonary/Chest: No respiratory distress. She has no wheezes.  Abdominal: Soft. Bowel sounds are normal. She  exhibits no distension and no mass. There is no abdominal tenderness. There is no rebound and no guarding.  Musculoskeletal:        General: No tenderness or edema. Normal range of motion.     Cervical back: Normal range of motion and neck supple.  Neurological: She is alert and oriented to person, place, and time.  Skin: Skin is warm.  Psychiatric: Affect normal.    LABORATORY DATA:  I have reviewed the data as listed    Component Value Date/Time   NA 141 01/24/2020 1305   NA 139 10/21/2014 1332   K 5.3 (H) 01/24/2020 1305   K 4.4 10/21/2014 1332   CL 112 (H) 01/24/2020 1305   CL 108 (H) 10/21/2014 1332   CO2 18 (L) 01/24/2020 1305   CO2 24 10/21/2014 1332   GLUCOSE 152 (H) 01/24/2020 1305   GLUCOSE 103 (H) 10/21/2014 1332   BUN 48 (H) 01/24/2020 1305   BUN 21 (H) 10/21/2014 1332   CREATININE 3.87 (H) 01/24/2020 1305   CREATININE 1.45 (H) 10/21/2014 1332   CALCIUM 8.9 01/24/2020 1305   CALCIUM 9.1 10/21/2014 1332   PROT 7.1 01/24/2020 1305   PROT 7.5 10/21/2014 1332   ALBUMIN 3.8 01/24/2020 1305   ALBUMIN 3.6 10/21/2014 1332   AST 17 01/24/2020 1305   AST 15 10/21/2014 1332   ALT 23 01/24/2020 1305   ALT 27 10/21/2014 1332   ALKPHOS 86 01/24/2020 1305   ALKPHOS 94 10/21/2014 1332   BILITOT 0.3 01/24/2020 1305   BILITOT 0.2 10/21/2014 1332   GFRNONAA 11 (L) 01/24/2020 1305   GFRNONAA 39 (L) 10/21/2014 1332   GFRNONAA 27 (L) 05/10/2014 0031   GFRAA 13 (L) 01/24/2020 1305   GFRAA 47 (L) 10/21/2014 1332   GFRAA 32 (L) 05/10/2014 0031    No results found for: SPEP, UPEP  Lab Results  Component Value Date   WBC 6.3 01/24/2020   NEUTROABS 3.6 01/24/2020   HGB 9.2 (L) 01/24/2020   HCT 30.2 (L) 01/24/2020   MCV 90.7 01/24/2020   PLT 324 01/24/2020      Chemistry  Component Value Date/Time   NA 141 01/24/2020 1305   NA 139 10/21/2014 1332   K 5.3 (H) 01/24/2020 1305   K 4.4 10/21/2014 1332   CL 112 (H) 01/24/2020 1305   CL 108 (H) 10/21/2014 1332   CO2  18 (L) 01/24/2020 1305   CO2 24 10/21/2014 1332   BUN 48 (H) 01/24/2020 1305   BUN 21 (H) 10/21/2014 1332   CREATININE 3.87 (H) 01/24/2020 1305   CREATININE 1.45 (H) 10/21/2014 1332      Component Value Date/Time   CALCIUM 8.9 01/24/2020 1305   CALCIUM 9.1 10/21/2014 1332   ALKPHOS 86 01/24/2020 1305   ALKPHOS 94 10/21/2014 1332   AST 17 01/24/2020 1305   AST 15 10/21/2014 1332   ALT 23 01/24/2020 1305   ALT 27 10/21/2014 1332   BILITOT 0.3 01/24/2020 1305   BILITOT 0.2 10/21/2014 1332       ASSESSMENT & PLAN:   Anemia due to stage 4 chronic kidney disease (HCC) # Anemia Iron deficiency- hemoglobin today 9.2;   On PO iron once a day.  Worsening/ symptomatic. Proceed with IV venofer. Awaiting iron studies from today.    # We discussed the need for Retacrit moving going forward.  We will consider at next visit.  Ordered.  # CKD-stage IV creatinine-3.8-slow worsening overall stable.  # DISPOSITION: # Ferrahem today. # Follow-up 1 months/MD-labs CBC BMP- possible retacrit- Dr.B      Cammie Sickle, MD 01/27/2020 1:46 PM

## 2020-01-28 ENCOUNTER — Other Ambulatory Visit: Payer: Self-pay

## 2020-01-28 ENCOUNTER — Emergency Department
Admission: EM | Admit: 2020-01-28 | Discharge: 2020-01-28 | Disposition: A | Discharge status: Home / Self Care | Payer: Medicare Other | Attending: Emergency Medicine | Admitting: Emergency Medicine

## 2020-01-28 ENCOUNTER — Encounter: Payer: Self-pay | Admitting: Emergency Medicine

## 2020-01-28 DIAGNOSIS — F17210 Nicotine dependence, cigarettes, uncomplicated: Secondary | ICD-10-CM | POA: Diagnosis not present

## 2020-01-28 DIAGNOSIS — Z9861 Coronary angioplasty status: Secondary | ICD-10-CM | POA: Diagnosis not present

## 2020-01-28 DIAGNOSIS — Z794 Long term (current) use of insulin: Secondary | ICD-10-CM | POA: Insufficient documentation

## 2020-01-28 DIAGNOSIS — E1122 Type 2 diabetes mellitus with diabetic chronic kidney disease: Secondary | ICD-10-CM | POA: Insufficient documentation

## 2020-01-28 DIAGNOSIS — R0981 Nasal congestion: Secondary | ICD-10-CM | POA: Diagnosis present

## 2020-01-28 DIAGNOSIS — I2510 Atherosclerotic heart disease of native coronary artery without angina pectoris: Secondary | ICD-10-CM | POA: Insufficient documentation

## 2020-01-28 DIAGNOSIS — J3089 Other allergic rhinitis: Secondary | ICD-10-CM

## 2020-01-28 DIAGNOSIS — Z79899 Other long term (current) drug therapy: Secondary | ICD-10-CM | POA: Insufficient documentation

## 2020-01-28 DIAGNOSIS — N184 Chronic kidney disease, stage 4 (severe): Secondary | ICD-10-CM | POA: Diagnosis not present

## 2020-01-28 DIAGNOSIS — I129 Hypertensive chronic kidney disease with stage 1 through stage 4 chronic kidney disease, or unspecified chronic kidney disease: Secondary | ICD-10-CM | POA: Diagnosis not present

## 2020-01-28 MED ORDER — FLUTICASONE FUROATE 27.5 MCG/SPRAY NA SUSP: 2.0000 | Freq: Every day | NASAL | 0 refills | Status: DC

## 2020-01-28 MED ORDER — FLUTICASONE FUROATE 27.5 MCG/SPRAY NA SUSP: 2.0000 | Freq: Every day | NASAL | 0 refills | Status: AC

## 2020-01-28 MED ORDER — PSEUDOEPHEDRINE HCL 30 MG PO TABS: 30.0000 mg | ORAL_TABLET | Freq: Four times a day (QID) | ORAL | 0 refills | Status: AC | PRN

## 2020-01-28 NOTE — ED Triage Notes (Signed)
C/O sinus infection.  Watery eyes, ears stopped up, sinus congestion x 2 weeks

## 2020-01-28 NOTE — ED Provider Notes (Signed)
[facility] Emergency Department Provider Note  ____________________________________________  Time seen: Approximately 9:23 PM  I have reviewed the triage vital signs and the nursing notes.   HISTORY  Chief Complaint sinus congestion   HPI Caitlin Bass is a 69 y.o. female presenting to the emergency department for treatment and evaluation  of nasal congestion, sinus pressure, and watery eyes.  Symptoms started approximately 2 weeks ago.  No known exposure to COVID-19 or influenza.  She denies fever.  No relief with Zyrtec.   Past Medical History:  Diagnosis Date  . Anemia   . Anxiety   . Arthritis   . CKD (chronic kidney disease), stage IV ([place])   . Coronary artery disease    a. 03/2014 NSTEMI/Cath: LM nl, LAD 61m, 60d, D1 60, D2 nl, d3 40, LCX 100 w/ L->L collats, RCA non dom, min irregs, EF 55%-->Med Rx; b. 09/2019 MV: EF 30-44%, large basal inf, basal inflat, mid inf, mid inflat, apical defect - partially reversible.  . DDD (degenerative disc disease)    a. s/p upper back surgery spring 2015.  . Diabetes mellitus without complication ([place])   . Diverticulosis   . Fibromyalgia   . GERD (gastroesophageal reflux disease)   . Hiatal hernia   . Hyperlipidemia   . Hypertensive heart disease    a. 03/2015 Echo: 55-60%, mild conc LVH; b. 10/2019 Echo: EF 65-70%, mod LVH.  . Mitral stenosis    a. 10/2019 Echo: EF 65-70%, mod LVH. No rwma. Mildly dil LA. MV Ca2+ w/ mod-sev MS (mean grad 87mmHg, Valve area 1.1-1.2). Mild to mod AoV sclerosis w/o stenosis.  . Syncope and collapse    a. Early 03/2014->did not seek medical attn.  . Tobacco abuse     Patient Active Problem List   Diagnosis Date Noted  . Mitral valve stenosis   . Anemia due to stage 4 chronic kidney disease ([place]) 02/22/2017  . Other iron deficiency anemia 02/26/2016  . Hypertensive heart disease   . Tobacco abuse   . Hyperlipidemia   . Coronary artery disease   . Diabetes mellitus  without complication ([place])   . Hypertension   . CKD (chronic kidney disease), stage IV ([place])   . GERD (gastroesophageal reflux disease)   . DDD (degenerative disc disease)     Past Surgical History:  Procedure Laterality Date  . BREAST CYST ASPIRATION Right 2005   NEG  . CARDIAC CATHETERIZATION  06/2005   [facility]  . CARDIAC CATHETERIZATION  04/2014   [facility]  . COLONOSCOPY WITH PROPOFOL N/A 04/27/2016   Procedure: COLONOSCOPY WITH PROPOFOL;  Surgeon: Manya Silvas, MD;  Location: [facility] ENDOSCOPY;  Service: Endoscopy;  Laterality: N/A;  . CORONARY ANGIOPLASTY    . DILATATION & CURETTAGE/HYSTEROSCOPY WITH MYOSURE N/A 09/18/2015   Procedure: DILATATION & CURETTAGE/HYSTEROSCOPY WITH MYOSURE;  Surgeon: Boykin Nearing, MD;  Location: [facility] ORS;  Service: Gynecology;  Laterality: N/A;  . ESOPHAGOGASTRODUODENOSCOPY (EGD) WITH PROPOFOL N/A 04/27/2016   Procedure: ESOPHAGOGASTRODUODENOSCOPY (EGD) WITH PROPOFOL;  Surgeon: Manya Silvas, MD;  Location: [facility] ENDOSCOPY;  Service: Endoscopy;  Laterality: N/A;  . NECK SURGERY     Degenerative Disk Disease and removal of a spinal cyst  . TEE WITHOUT CARDIOVERSION N/A 12/30/2019   Procedure: TRANSESOPHAGEAL ECHOCARDIOGRAM (TEE);  Surgeon: Wellington Hampshire, MD;  Location: [facility] ORS;  Service: Cardiovascular;  Laterality: N/A;  . TUBAL LIGATION      Prior to Admission medications   Medication Sig Start Date End Date Taking?  Authorizing Provider  allopurinol (ZYLOPRIM) 100 MG tablet Take 100 mg by mouth 2 (two) times daily.    [provider]  amLODipine (NORVASC) 10 MG tablet Take 1 tablet (10 mg total) by mouth daily. 01/10/20 07/08/20  Loel Dubonnet, NP  Calcium Carbonate-Vitamin D (CALCIUM 600+D) 600-400 MG-UNIT tablet Take 1 tablet by mouth daily.    [provider]  carvedilol (COREG) 12.5 MG tablet Take 1 tablet (12.5 mg total) by mouth 2 (two) times daily. 11/19/19   Theora Gianotti, NP  cetirizine (ZYRTEC) 10 MG  tablet Take 10 mg by mouth at bedtime.     [provider]  cholecalciferol (VITAMIN D) 25 MCG (1000 UT) tablet Take 1,000 Units by mouth daily.    [provider]  colchicine 0.6 MG tablet Take 0.6 mg by mouth daily as needed (gout flare).  08/14/15   [provider]  ferrous sulfate 325 (65 FE) MG tablet Take 325 mg by mouth every other day.    [provider]  fluticasone (FLONASE) 50 MCG/ACT nasal spray Place 1 spray into both nostrils at bedtime.     [provider]  fluticasone (VERAMYST) 27.5 MCG/SPRAY nasal spray Place 2 sprays into the nose daily. 01/28/20   Brisa Auth B, FNP  GLUCOSAMINE HCL PO Take 1 tablet by mouth daily.    [provider]  HUMALOG KWIKPEN 100 UNIT/ML KwikPen Inject 8-28 Units into the skin See admin instructions. Inject 8 units subcutaneously in the morning, 28 units with lunch & 28 units with supper 11/13/19   [provider]  LANTUS SOLOSTAR 100 UNIT/ML Solostar Pen Inject 40-44 Units into the skin See admin instructions. Inject 40 units subcutaneously in the morning & inject 44 units subcutaneously at night. 11/13/19   [provider]  linaclotide (LINZESS) 72 MCG capsule Take 72 mcg by mouth every other day.  01/03/17 11/18/28  [provider]  magnesium oxide (MAG-OX) 400 MG tablet Take 400 mg by mouth 3 (three) times daily.     [provider]  Multiple Vitamin (MULTIVITAMIN WITH MINERALS) TABS tablet Take 1 tablet by mouth daily.    [provider]  Olopatadine HCl (PATADAY) 0.2 % SOLN Place 1 drop into both eyes 2 (two) times daily as needed (allergy eyes.).    [provider]  Omega-3 Fatty Acids (FISH OIL MAXIMUM STRENGTH) 1200 MG CAPS Take 1,000 mg by mouth 3 (three) times daily.  02/10/16   [provider]  pantoprazole (PROTONIX) 20 MG tablet Take 20 mg by mouth 2 (two) times daily.  07/25/16   [provider]  pseudoephedrine (SUDAFED)  30 MG tablet Take 1 tablet (30 mg total) by mouth every 6 (six) hours as needed for up to 14 days for congestion. 01/28/20 02/11/20  [place] Jeff, Johnette Abraham B, FNP  rosuvastatin (CRESTOR) 40 MG tablet Take 1 tablet (40 mg total) by mouth daily. 01/14/20 04/13/20  Wellington Hampshire, MD  sodium bicarbonate 650 MG tablet Take 650 mg by mouth 3 (three) times daily.    [provider]  sodium polystyrene (KAYEXALATE) 15 GM/60ML suspension Take 60 g by mouth once.    [provider]  SYMBICORT 160-4.5 MCG/ACT inhaler Inhale 2 puffs into the lungs 2 (two) times daily. 11/13/19   [provider]  tiZANidine (ZANAFLEX) 4 MG tablet Take 4 mg by mouth 3 (three) times daily as needed for muscle spasms. 08/22/19   [provider]  triamcinolone cream (KENALOG) 0.1 % Apply  1 application topically 2 (two) times daily as needed (skin rash/irritation).     [provider]    Allergies Aspirin and Hydrochlorothiazide  Family History  Problem Relation Age of Onset  . Heart disease Mother   . Heart attack Mother   . Hypertension Mother   . Hyperlipidemia Mother   . Breast cancer Sister     Social History Social History   Tobacco Use  . Smoking status: Current Every Day Smoker    Packs/day: 0.25    Years: 40.00    Pack years: 10.00    Types: Cigarettes  . Smokeless tobacco: Never Used  Substance Use Topics  . Alcohol use: No  . Drug use: No    Review of Systems Constitutional: Negative for fever/chills.  Normal appetite. HEENT: Positive for watery eyes, positive for nasal congestion, negative for sore throat. Cardiovascular: Denies chest pain. Respiratory: Negative for shortness of breath.  Negative for cough.  Negative for wheezing.  Gastrointestinal: No nausea, no vomiting.  No diarrhea.  Musculoskeletal: Negative for body aches Skin: Negative for rash. Neurological: Negative for headaches ____________________________________________   PHYSICAL  EXAM:  VITAL SIGNS: ED Triage Vitals  Enc Vitals Group     BP 01/28/20 1506 (!) 149/73     Pulse Rate 01/28/20 1506 78     Resp 01/28/20 1506 16     Temp 01/28/20 1506 98.5 F (36.9 C)     Temp Source 01/28/20 1506 Oral     SpO2 01/28/20 1506 96 %     Weight 01/28/20 1505 162 lb 0.6 oz (73.5 kg)     Height --      Head Circumference --      Peak Flow --      Pain Score 01/28/20 1632 0     Pain Loc --      Pain Edu? --      Excl. in [place]? --     Constitutional: Alert and oriented.  Well appearing and in no acute distress. Eyes: Conjunctivae are normal.  Clear drainage noted bilaterally Ears: Air-fluid level bilateral TM without evidence of acute otitis media Nose: Maxillary sinus congestion noted; no rhinnorhea. Mouth/Throat: Mucous membranes are moist.  Oropharynx normal. Tonsils flat. Uvula midline. Neck: No stridor.  Lymphatic: No cervical lymphadenopathy. Cardiovascular: Normal rate, regular rhythm. Good peripheral circulation. Respiratory: Respirations are even and unlabored.  No retractions.  Breath sounds clear to auscultation.  No wheezing.. Gastrointestinal: Soft and nontender.  Musculoskeletal: FROM x 4 extremities.  Neurologic:  Normal speech and language. Skin:  Skin is warm, dry and intact. No rash noted. Psychiatric: Mood and affect are normal. Speech and behavior are normal.  ____________________________________________   LABS (all labs ordered are listed, but only abnormal results are displayed)  Labs Reviewed - No data to display ____________________________________________  EKG  Not indicated ____________________________________________  RADIOLOGY  Not indicated ____________________________________________   PROCEDURES  Procedure(s) performed: None  Critical Care performed: No ____________________________________________   INITIAL IMPRESSION / ASSESSMENT AND PLAN / ED COURSE  69 y.o. female presenting to the emergency department for  treatment and evaluation of sinus pressure, nasal congestion, and watery eyes for the past 2 weeks.  Exam is overall reassuring.  There is no evidence of bacterial sinus infection.  She was advised to continue taking her Zyrtec and will be given a prescription for fluticasone nasal spray and Sudafed.  She is to follow-up with primary care if not improving over the next few days or if her symptoms  change or worsen.  She was also advised that she may return to the emergency department if she is unable to see primary care.    Medications - No data to display  ED Discharge Orders         Ordered    fluticasone (VERAMYST) 27.5 MCG/SPRAY nasal spray  Daily,   Status:  Discontinued     01/28/20 1627    pseudoephedrine (SUDAFED) 30 MG tablet  Every 6 hours PRN     01/28/20 1627    fluticasone (VERAMYST) 27.5 MCG/SPRAY nasal spray  Daily     01/28/20 1629           Pertinent labs & imaging results that were available during my care of the patient were reviewed by me and considered in my medical decision making (see chart for details).    If controlled substance prescribed during this visit, 12 month history viewed on the [place] prior to issuing an initial prescription for Schedule II or III opiod. ____________________________________________   FINAL CLINICAL IMPRESSION(S) / ED DIAGNOSES  Final diagnoses:  Environmental and seasonal allergies    Note:  This document was prepared using Dragon voice recognition software and may include unintentional dictation errors.    Victorino Dike, FNP 01/28/20 2129    Duffy Bruce, MD 01/29/20 1428

## 2020-01-28 NOTE — Discharge Instructions (Signed)
Please follow up with your primary care provider if not improving over the next week or so.  Continue your Zyrtec daily.  Return to the ER for symptoms that change or worsen if unable to schedule an appointment.

## 2020-01-28 NOTE — ED Notes (Signed)
See triage note  Presents with some sinus pressure and congestion sxs' started about 2 weeks ago  No fever

## 2020-02-13 ENCOUNTER — Other Ambulatory Visit: Payer: Self-pay

## 2020-02-13 DIAGNOSIS — E782 Mixed hyperlipidemia: Secondary | ICD-10-CM

## 2020-02-13 MED ORDER — ROSUVASTATIN CALCIUM 40 MG PO TABS: 40.0000 mg | ORAL_TABLET | Freq: Every day | ORAL | 3 refills | Status: DC

## 2020-02-21 ENCOUNTER — Inpatient Hospital Stay: Payer: Medicare Other

## 2020-02-21 ENCOUNTER — Inpatient Hospital Stay (HOSPITAL_BASED_OUTPATIENT_CLINIC_OR_DEPARTMENT_OTHER): Payer: Medicare Other | Admitting: Internal Medicine

## 2020-02-21 ENCOUNTER — Inpatient Hospital Stay: Payer: Medicare Other | Attending: Internal Medicine

## 2020-02-21 VITALS — BP 145/96 | HR 88 | Temp 96.6°F | Wt 162.0 lb

## 2020-02-21 DIAGNOSIS — D508 Other iron deficiency anemias: Secondary | ICD-10-CM

## 2020-02-21 DIAGNOSIS — I2510 Atherosclerotic heart disease of native coronary artery without angina pectoris: Secondary | ICD-10-CM | POA: Insufficient documentation

## 2020-02-21 DIAGNOSIS — E875 Hyperkalemia: Secondary | ICD-10-CM | POA: Insufficient documentation

## 2020-02-21 DIAGNOSIS — D631 Anemia in chronic kidney disease: Secondary | ICD-10-CM | POA: Diagnosis present

## 2020-02-21 DIAGNOSIS — N184 Chronic kidney disease, stage 4 (severe): Secondary | ICD-10-CM | POA: Insufficient documentation

## 2020-02-21 LAB — CBC WITH DIFFERENTIAL/PLATELET
Abs Immature Granulocytes: 0.06 10*3/uL (ref 0.00–0.07)
Basophils Absolute: 0 10*3/uL (ref 0.0–0.1)
Basophils Relative: 1 %
Eosinophils Absolute: 0.1 10*3/uL (ref 0.0–0.5)
Eosinophils Relative: 2 %
HCT: 30.6 % — ABNORMAL LOW (ref 36.0–46.0)
Hemoglobin: 9.7 g/dL — ABNORMAL LOW (ref 12.0–15.0)
Immature Granulocytes: 1 %
Lymphocytes Relative: 32 %
Lymphs Abs: 2 10*3/uL (ref 0.7–4.0)
MCH: 28.3 pg (ref 26.0–34.0)
MCHC: 31.7 g/dL (ref 30.0–36.0)
MCV: 89.2 fL (ref 80.0–100.0)
Monocytes Absolute: 0.5 10*3/uL (ref 0.1–1.0)
Monocytes Relative: 8 %
Neutro Abs: 3.6 10*3/uL (ref 1.7–7.7)
Neutrophils Relative %: 56 %
Platelets: 287 10*3/uL (ref 150–400)
RBC: 3.43 MIL/uL — ABNORMAL LOW (ref 3.87–5.11)
RDW: 16.3 % — ABNORMAL HIGH (ref 11.5–15.5)
WBC: 6.4 10*3/uL (ref 4.0–10.5)
nRBC: 0 % (ref 0.0–0.2)

## 2020-02-21 LAB — BASIC METABOLIC PANEL
Anion gap: 8 (ref 5–15)
BUN: 55 mg/dL — ABNORMAL HIGH (ref 8–23)
CO2: 19 mmol/L — ABNORMAL LOW (ref 22–32)
Calcium: 8.9 mg/dL (ref 8.9–10.3)
Chloride: 111 mmol/L (ref 98–111)
Creatinine, Ser: 4.45 mg/dL — ABNORMAL HIGH (ref 0.44–1.00)
GFR calc Af Amer: 11 mL/min — ABNORMAL LOW (ref >60)
GFR calc non Af Amer: 10 mL/min — ABNORMAL LOW (ref >60)
Glucose, Bld: 214 mg/dL — ABNORMAL HIGH (ref 70–99)
Potassium: 5.3 mmol/L — ABNORMAL HIGH (ref 3.5–5.1)
Sodium: 138 mmol/L (ref 135–145)

## 2020-02-21 MED ORDER — EPOETIN ALFA-EPBX 10000 UNIT/ML IJ SOLN
20000.0000 [IU] | Freq: Once | INTRAMUSCULAR | Status: AC
  Administered 2020-02-21: 12:00:00 20000 [IU] via SUBCUTANEOUS
  Filled 2020-02-21: qty 2

## 2020-02-21 NOTE — Assessment & Plan Note (Addendum)
#   Anemia Iron deficiency- hemoglobin today 9.7;   On PO iron once a day.  Worsening/ symptomatic. S/p Ferrahem. Proceed with retacrt today.  # Mild hyperkalemia.  Potassium 5.3.  Recommend close monitoring with nephrology.  # CKD-stage IV creatinine-4.3; GFR-11 slow worsening overall stable; appt in 2 weeks.   # DISPOSITION: # Retacrit today # Follow-up 1 months/MD-labs CBC BMP- possible retacrit- Dr.B  Cc; Dr.Swain

## 2020-02-21 NOTE — Progress Notes (Signed)
[place] OFFICE PROGRESS NOTE  Patient Care Team: Marguerita Merles, MD as PCP - General (Family Medicine) Wellington Hampshire, MD as PCP - Cardiology (Cardiology)   SUMMARY OF ONCOLOGIC HISTORY:  # March 2017- Anemia- Hb 8; plan Iv iron s/p IV iron x2 [[april 2017]; [place] May 2017.   # CKD [? nephrologist as per pt; ? Dr.Swain]/ CAD  # AUG 2016- ELEVATED K/L ratio 1.99; SPEP- Neg.    INTERVAL HISTORY:  69 year-old pleasant female patient with above history of anemia secondary to iron deficiency/CKD.  Patient complains of worsening fatigue.  Also complains of mild swelling in legs.  Shortness of breath on exertion.  Otherwise no blood in stools or black or stools.   Review of Systems  Constitutional: Positive for malaise/fatigue. Negative for chills, diaphoresis, fever and weight loss.  HENT: Negative for nosebleeds and sore throat.   Eyes: Negative for double vision.  Respiratory: Positive for shortness of breath. Negative for cough, hemoptysis, sputum production and wheezing.   Cardiovascular: Negative for chest pain, palpitations, orthopnea and leg swelling.  Gastrointestinal: Negative for abdominal pain, blood in stool, constipation, diarrhea, heartburn, melena, nausea and vomiting.  Genitourinary: Negative for dysuria, frequency and urgency.  Musculoskeletal: Negative for back pain and joint pain.  Skin: Negative.  Negative for itching and rash.  Neurological: Negative for dizziness, tingling, focal weakness, weakness and headaches.  Endo/Heme/Allergies: Does not bruise/bleed easily.  Psychiatric/Behavioral: Negative for depression. The patient is not nervous/anxious and does not have insomnia.      PAST MEDICAL HISTORY :  Past Medical History:  Diagnosis Date  . Anemia   . Anxiety   . Arthritis   . CKD (chronic kidney disease), stage IV ([place])   . Coronary artery disease    a. 03/2014 NSTEMI/Cath: LM nl, LAD 44m, 60d, D1 60, D2 nl, d3 40, LCX  100 w/ L->L collats, RCA non dom, min irregs, EF 55%-->Med Rx; b. 09/2019 MV: EF 30-44%, large basal inf, basal inflat, mid inf, mid inflat, apical defect - partially reversible.  . DDD (degenerative disc disease)    a. s/p upper back surgery spring 2015.  . Diabetes mellitus without complication ([place])   . Diverticulosis   . Fibromyalgia   . GERD (gastroesophageal reflux disease)   . Hiatal hernia   . Hyperlipidemia   . Hypertensive heart disease    a. 03/2015 Echo: 55-60%, mild conc LVH; b. 10/2019 Echo: EF 65-70%, mod LVH.  . Mitral stenosis    a. 10/2019 Echo: EF 65-70%, mod LVH. No rwma. Mildly dil LA. MV Ca2+ w/ mod-sev MS (mean grad 32mmHg, Valve area 1.1-1.2). Mild to mod AoV sclerosis w/o stenosis.  . Syncope and collapse    a. Early 03/2014->did not seek medical attn.  . Tobacco abuse     PAST SURGICAL HISTORY :   Past Surgical History:  Procedure Laterality Date  . BREAST CYST ASPIRATION Right 2005   NEG  . CARDIAC CATHETERIZATION  06/2005   [facility]  . CARDIAC CATHETERIZATION  04/2014   [facility]  . COLONOSCOPY WITH PROPOFOL N/A 04/27/2016   Procedure: COLONOSCOPY WITH PROPOFOL;  Surgeon: Manya Silvas, MD;  Location: [facility] ENDOSCOPY;  Service: Endoscopy;  Laterality: N/A;  . CORONARY ANGIOPLASTY    . DILATATION & CURETTAGE/HYSTEROSCOPY WITH MYOSURE N/A 09/18/2015   Procedure: DILATATION & CURETTAGE/HYSTEROSCOPY WITH MYOSURE;  Surgeon: Boykin Nearing, MD;  Location: [facility] ORS;  Service: Gynecology;  Laterality: N/A;  . ESOPHAGOGASTRODUODENOSCOPY (EGD) WITH PROPOFOL  N/A 04/27/2016   Procedure: ESOPHAGOGASTRODUODENOSCOPY (EGD) WITH PROPOFOL;  Surgeon: Manya Silvas, MD;  Location[facility] ENDOSCOPY;  Service: Endoscopy;  Laterality: N/A;  . NECK SURGERY     Degenerative Disk Disease and removal of a spinal cyst  . TEE WITHOUT CARDIOVERSION N/A 12/30/2019   Procedure: TRANSESOPHAGEAL ECHOCARDIOGRAM (TEE);  Surgeon: Wellington Hampshire, MD;  Location: [facility] ORS;  Service:  Cardiovascular;  Laterality: N/A;  . TUBAL LIGATION      FAMILY HISTORY :   Family History  Problem Relation Age of Onset  . Heart disease Mother   . Heart attack Mother   . Hypertension Mother   . Hyperlipidemia Mother   . Breast cancer Sister     SOCIAL HISTORY:   Social History   Tobacco Use  . Smoking status: Current Every Day Smoker    Packs/day: 0.25    Years: 40.00    Pack years: 10.00    Types: Cigarettes  . Smokeless tobacco: Never Used  Substance Use Topics  . Alcohol use: No  . Drug use: No    ALLERGIES:  is allergic to aspirin and hydrochlorothiazide.  MEDICATIONS:  Current Outpatient Medications  Medication Sig Dispense Refill  . allopurinol (ZYLOPRIM) 100 MG tablet Take 100 mg by mouth 2 (two) times daily.    Marland Kitchen amLODipine (NORVASC) 10 MG tablet Take 1 tablet (10 mg total) by mouth daily. 90 tablet 1  . Calcium Carbonate-Vitamin D (CALCIUM 600+D) 600-400 MG-UNIT tablet Take 1 tablet by mouth daily.    . carvedilol (COREG) 12.5 MG tablet Take 1 tablet (12.5 mg total) by mouth 2 (two) times daily. 180 tablet 0  . cetirizine (ZYRTEC) 10 MG tablet Take 10 mg by mouth at bedtime.     . cholecalciferol (VITAMIN D) 25 MCG (1000 UT) tablet Take 1,000 Units by mouth daily.    . colchicine 0.6 MG tablet Take 0.6 mg by mouth daily as needed (gout flare).     . ferrous sulfate 325 (65 FE) MG tablet Take 325 mg by mouth every other day.    . fluticasone (FLONASE) 50 MCG/ACT nasal spray Place 1 spray into both nostrils at bedtime.     . fluticasone (VERAMYST) 27.5 MCG/SPRAY nasal spray Place 2 sprays into the nose daily. 10 g 0  . GLUCOSAMINE HCL PO Take 1 tablet by mouth daily.    Marland Kitchen HUMALOG KWIKPEN 100 UNIT/ML KwikPen Inject 8-28 Units into the skin See admin instructions. Inject 8 units subcutaneously in the morning, 28 units with lunch & 28 units with supper    . LANTUS SOLOSTAR 100 UNIT/ML Solostar Pen Inject 40-44 Units into the skin See admin instructions. Inject 40  units subcutaneously in the morning & inject 44 units subcutaneously at night.    . linaclotide (LINZESS) 72 MCG capsule Take 72 mcg by mouth every other day.     . magnesium oxide (MAG-OX) 400 MG tablet Take 400 mg by mouth 3 (three) times daily.     . Multiple Vitamin (MULTIVITAMIN WITH MINERALS) TABS tablet Take 1 tablet by mouth daily.    . Olopatadine HCl (PATADAY) 0.2 % SOLN Place 1 drop into both eyes 2 (two) times daily as needed (allergy eyes.).    Marland Kitchen Omega-3 Fatty Acids (FISH OIL MAXIMUM STRENGTH) 1200 MG CAPS Take 1,000 mg by mouth 3 (three) times daily.   0  . pantoprazole (PROTONIX) 20 MG tablet Take 20 mg by mouth 2 (two) times daily.   0  . rosuvastatin (CRESTOR)  40 MG tablet Take 1 tablet (40 mg total) by mouth daily. 90 tablet 3  . sodium bicarbonate 650 MG tablet Take 650 mg by mouth 3 (three) times daily.    . sodium polystyrene (KAYEXALATE) 15 GM/60ML suspension Take 60 g by mouth once.    . SYMBICORT 160-4.5 MCG/ACT inhaler Inhale 2 puffs into the lungs 2 (two) times daily.    Marland Kitchen tiZANidine (ZANAFLEX) 4 MG tablet Take 4 mg by mouth 3 (three) times daily as needed for muscle spasms.    Marland Kitchen triamcinolone cream (KENALOG) 0.1 % Apply 1 application topically 2 (two) times daily as needed (skin rash/irritation).      No current facility-administered medications for this visit.    PHYSICAL EXAMINATION:   BP (!) 145/96 (BP Location: Left Arm, Patient Position: Sitting, Cuff Size: Normal)   Pulse 88   Temp (!) 96.6 F (35.9 C) (Tympanic)   Wt 162 lb (73.5 kg)   BMI 28.70 kg/m   Filed Weights   02/21/20 1050  Weight: 162 lb (73.5 kg)    Physical Exam  Constitutional: She is oriented to person, place, and time and well-developed, well-nourished, and in no distress.  HENT:  Head: Normocephalic and atraumatic.  Mouth/Throat: Oropharynx is clear and moist. No oropharyngeal exudate.  Eyes: Pupils are equal, round, and reactive to light.  Cardiovascular: Normal rate and  regular rhythm.  Pulmonary/Chest: No respiratory distress. She has no wheezes.  Abdominal: Soft. Bowel sounds are normal. She exhibits no distension and no mass. There is no abdominal tenderness. There is no rebound and no guarding.  Musculoskeletal:        General: No tenderness or edema. Normal range of motion.     Cervical back: Normal range of motion and neck supple.  Neurological: She is alert and oriented to person, place, and time.  Skin: Skin is warm.  Psychiatric: Affect normal.    LABORATORY DATA:  I have reviewed the data as listed    Component Value Date/Time   NA 138 02/21/2020 1036   NA 139 10/21/2014 1332   K 5.3 (H) 02/21/2020 1036   K 4.4 10/21/2014 1332   CL 111 02/21/2020 1036   CL 108 (H) 10/21/2014 1332   CO2 19 (L) 02/21/2020 1036   CO2 24 10/21/2014 1332   GLUCOSE 214 (H) 02/21/2020 1036   GLUCOSE 103 (H) 10/21/2014 1332   BUN 55 (H) 02/21/2020 1036   BUN 21 (H) 10/21/2014 1332   CREATININE 4.45 (H) 02/21/2020 1036   CREATININE 1.45 (H) 10/21/2014 1332   CALCIUM 8.9 02/21/2020 1036   CALCIUM 9.1 10/21/2014 1332   PROT 7.1 01/24/2020 1305   PROT 7.5 10/21/2014 1332   ALBUMIN 3.8 01/24/2020 1305   ALBUMIN 3.6 10/21/2014 1332   AST 17 01/24/2020 1305   AST 15 10/21/2014 1332   ALT 23 01/24/2020 1305   ALT 27 10/21/2014 1332   ALKPHOS 86 01/24/2020 1305   ALKPHOS 94 10/21/2014 1332   BILITOT 0.3 01/24/2020 1305   BILITOT 0.2 10/21/2014 1332   GFRNONAA 10 (L) 02/21/2020 1036   GFRNONAA 39 (L) 10/21/2014 1332   GFRNONAA 27 (L) 05/10/2014 0031   GFRAA 11 (L) 02/21/2020 1036   GFRAA 47 (L) 10/21/2014 1332   GFRAA 32 (L) 05/10/2014 0031    No results found for: SPEP, UPEP  Lab Results  Component Value Date   WBC 6.4 02/21/2020   NEUTROABS 3.6 02/21/2020   HGB 9.7 (L) 02/21/2020   HCT 30.6 (L) 02/21/2020  MCV 89.2 02/21/2020   PLT 287 02/21/2020      Chemistry      Component Value Date/Time   NA 138 02/21/2020 1036   NA 139 10/21/2014  1332   K 5.3 (H) 02/21/2020 1036   K 4.4 10/21/2014 1332   CL 111 02/21/2020 1036   CL 108 (H) 10/21/2014 1332   CO2 19 (L) 02/21/2020 1036   CO2 24 10/21/2014 1332   BUN 55 (H) 02/21/2020 1036   BUN 21 (H) 10/21/2014 1332   CREATININE 4.45 (H) 02/21/2020 1036   CREATININE 1.45 (H) 10/21/2014 1332      Component Value Date/Time   CALCIUM 8.9 02/21/2020 1036   CALCIUM 9.1 10/21/2014 1332   ALKPHOS 86 01/24/2020 1305   ALKPHOS 94 10/21/2014 1332   AST 17 01/24/2020 1305   AST 15 10/21/2014 1332   ALT 23 01/24/2020 1305   ALT 27 10/21/2014 1332   BILITOT 0.3 01/24/2020 1305   BILITOT 0.2 10/21/2014 1332       ASSESSMENT & PLAN:   Anemia due to stage 4 chronic kidney disease (HCC) # Anemia Iron deficiency- hemoglobin today 9.7;   On PO iron once a day.  Worsening/ symptomatic. S/p Ferrahem. Proceed with retacrt today.  # Mild hyperkalemia.  Potassium 5.3.  Recommend close monitoring with nephrology.  # CKD-stage IV creatinine-4.3; GFR-11 slow worsening overall stable; appt in 2 weeks.   # DISPOSITION: # Retacrit today # Follow-up 1 months/MD-labs CBC BMP- possible retacrit- Dr.B  Cc; Dr.Swain      Cammie Sickle, MD 02/26/2020 7:57 AM

## 2020-02-27 NOTE — Telephone Encounter (Signed)
Pt to see Dr. Fletcher Anon this month please add to reason to see pt for pre-op clearance.  Procedure is in May.  Thank you.

## 2020-02-27 NOTE — Telephone Encounter (Signed)
Pt has appt to see Dr. Fletcher Anon 03/10/20 for pre op clearance. I will forward clearance note to [place] for upcoming appt. Will send FYI to requesting provider in regards to appt 03/10/20. I will remove from the pre op call back pool.

## 2020-03-10 ENCOUNTER — Encounter: Payer: Self-pay | Admitting: Cardiovascular Disease

## 2020-03-10 ENCOUNTER — Other Ambulatory Visit: Payer: Self-pay

## 2020-03-10 ENCOUNTER — Ambulatory Visit (INDEPENDENT_AMBULATORY_CARE_PROVIDER_SITE_OTHER): Payer: Medicare Other | Admitting: Cardiovascular Disease

## 2020-03-10 VITALS — BP 140/70 | HR 77 | Ht 63.0 in | Wt 162.2 lb

## 2020-03-10 DIAGNOSIS — E782 Mixed hyperlipidemia: Secondary | ICD-10-CM | POA: Diagnosis not present

## 2020-03-10 DIAGNOSIS — I25118 Atherosclerotic heart disease of native coronary artery with other forms of angina pectoris: Secondary | ICD-10-CM

## 2020-03-10 DIAGNOSIS — I10 Essential (primary) hypertension: Secondary | ICD-10-CM | POA: Diagnosis not present

## 2020-03-10 DIAGNOSIS — Z720 Tobacco use: Secondary | ICD-10-CM | POA: Diagnosis not present

## 2020-03-10 NOTE — Patient Instructions (Signed)
Medication Instructions:  Your physician recommends that you continue on your current medications as directed. Please refer to the Current Medication list given to you today.  *If you need a refill on your cardiac medications before your next appointment, please call your pharmacy*   Lab Work: None ordered If you have labs (blood work) drawn today and your tests are completely normal, you will receive your results only by: Marland Kitchen MyChart Message (if you have MyChart) OR . A paper copy in the mail If you have any lab test that is abnormal or we need to change your treatment, we will call you to review the results.   Testing/Procedures: None ordered   Follow-Up: At [facility], you and your health needs are our priority.  As part of our continuing mission to provide you with exceptional heart care, we have created designated Provider Care Teams.  These Care Teams include your primary Cardiologist (physician) and Advanced Practice Providers (APPs -  Physician Assistants and Nurse Practitioners) who all work together to provide you with the care you need, when you need it.  We recommend signing up for the patient portal called "MyChart".  Sign up information is provided on this After Visit Summary.  MyChart is used to connect with patients for Virtual Visits (Telemedicine).  Patients are able to view lab/test results, encounter notes, upcoming appointments, etc.  Non-urgent messages can be sent to your provider as well.   To learn more about what you can do with MyChart, go to [email or web address].    Your next appointment:   6 month(s)  The format for your next appointment:   In Person  Provider:    You may see Kathlyn Sacramento, MD or one of the following Advanced Practice Providers on your designated Care Team:    Murray Hodgkins, NP  Christell Faith, PA-C  Marrianne Mood, PA-C    Other Instructions Your are considered moderate risk from a cardiac standpoint for your upcoming  colonoscopy.

## 2020-03-10 NOTE — Progress Notes (Signed)
Cardiology Office Note   Date:  03/10/2020   ID:  Caitlin Bass, DOB 09/21/1951, MRN [id number]  PCP:  Marguerita Merles, MD  Cardiologist:   Kathlyn Sacramento, MD   Chief Complaint  Patient presents with  . office visit    2 month F/U-Patient reports lack of energy; Meds verbally reviewed with patient.      History of Present Illness: Caitlin Bass is a 69 y.o. female who presents for a followup visit regarding coronary artery disease and mitral stenosis.  She had non-ST elevation myocardial infarction in May 2015. Echo showed nl LV fxn. Cardiac catheterization showed an occluded LCX with L-L collats and otherwise nonobstructive CAD. She was medically managed . 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  She has multiple other chronic conditions that include COPD, tobacco use, anemia of chronic disease, essential hypertension, hyperlipidemia and sleep apnea on CPAP. She had Lexiscan Myoview in November 2020 which showed evidence of prior inferior/inferolateral infarct with mild peri-infarct ischemia with ejection fraction of 44%.   Subsequent echocardiogram showed normal LV systolic function with moderate LVH with moderate to severe mitral stenosis.  She had an emergency room visit in January for hyperkalemia which was treated. A transesophageal echocardiogram was done in February which showed normal LV systolic function, grade 1 diastolic dysfunction, moderate calcification of the mitral valve with moderate mitral stenosis.  Valve area by pressure half-time was 1.6 cm with mean gradient of 9 mmHg. She continues to have significant exertional dyspnea but this has improved from before.  No chest pain.  Her renal function continues to gradually deteriorate with most recent GFR of 11.    Past Medical History:  Diagnosis Date  . Anemia   . Anxiety   . Arthritis   . CKD (chronic kidney disease), stage IV ([place])   . Coronary artery disease     a. 03/2014 NSTEMI/Cath: LM nl, LAD 73m, 60d, D1 60, D2 nl, d3 40, LCX 100 w/ L->L collats, RCA non dom, min irregs, EF 55%-->Med Rx; b. 09/2019 MV: EF 30-44%, large basal inf, basal inflat, mid inf, mid inflat, apical defect - partially reversible.  . DDD (degenerative disc disease)    a. s/p upper back surgery spring 2015.  . Diabetes mellitus without complication ([place])   . Diverticulosis   . Fibromyalgia   . GERD (gastroesophageal reflux disease)   . Hiatal hernia   . Hyperlipidemia   . Hypertensive heart disease    a. 03/2015 Echo: 55-60%, mild conc LVH; b. 10/2019 Echo: EF 65-70%, mod LVH.  . Mitral stenosis    a. 10/2019 Echo: EF 65-70%, mod LVH. No rwma. Mildly dil LA. MV Ca2+ w/ mod-sev MS (mean grad 20mmHg, Valve area 1.1-1.2). Mild to mod AoV sclerosis w/o stenosis.  . Syncope and collapse    a. Early 03/2014->did not seek medical attn.  . Tobacco abuse     Past Surgical History:  Procedure Laterality Date  . BREAST CYST ASPIRATION Right 2005   NEG  . CARDIAC CATHETERIZATION  06/2005   [facility]  . CARDIAC CATHETERIZATION  04/2014   [facility]  . COLONOSCOPY WITH PROPOFOL N/A 04/27/2016   Procedure: COLONOSCOPY WITH PROPOFOL;  Surgeon: Manya Silvas, MD;  Location: [facility] ENDOSCOPY;  Service: Endoscopy;  Laterality: N/A;  . CORONARY ANGIOPLASTY    . DILATATION & CURETTAGE/HYSTEROSCOPY WITH MYOSURE N/A 09/18/2015   Procedure: DILATATION & CURETTAGE/HYSTEROSCOPY WITH MYOSURE;  Surgeon: Boykin Nearing, MD;  Location: [facility] ORS;  Service: Gynecology;  Laterality: N/A;  . ESOPHAGOGASTRODUODENOSCOPY (EGD) WITH PROPOFOL N/A 04/27/2016   Procedure: ESOPHAGOGASTRODUODENOSCOPY (EGD) WITH PROPOFOL;  Surgeon: Manya Silvas, MD;  Location: [facility] ENDOSCOPY;  Service: Endoscopy;  Laterality: N/A;  . NECK SURGERY     Degenerative Disk Disease and removal of a spinal cyst  . TEE WITHOUT CARDIOVERSION N/A 12/30/2019   Procedure: TRANSESOPHAGEAL ECHOCARDIOGRAM (TEE);  Surgeon: Wellington Hampshire, MD;   Location: [facility] ORS;  Service: Cardiovascular;  Laterality: N/A;  . TUBAL LIGATION       Current Outpatient Medications  Medication Sig Dispense Refill  . allopurinol (ZYLOPRIM) 100 MG tablet Take 100 mg by mouth 2 (two) times daily.    Marland Kitchen amLODipine (NORVASC) 10 MG tablet Take 1 tablet (10 mg total) by mouth daily. 90 tablet 1  . Calcium Carbonate-Vitamin D (CALCIUM 600+D) 600-400 MG-UNIT tablet Take 1 tablet by mouth daily.    . carvedilol (COREG) 12.5 MG tablet Take 1 tablet (12.5 mg total) by mouth 2 (two) times daily. 180 tablet 0  . cetirizine (ZYRTEC) 10 MG tablet Take 10 mg by mouth at bedtime.     . cholecalciferol (VITAMIN D) 25 MCG (1000 UT) tablet Take 1,000 Units by mouth daily.    . colchicine 0.6 MG tablet Take 0.6 mg by mouth daily as needed (gout flare).     . ferrous sulfate 325 (65 FE) MG tablet Take 325 mg by mouth every other day.    . fluticasone (FLONASE) 50 MCG/ACT nasal spray Place 1 spray into both nostrils 2 (two) times daily as needed.     . fluticasone (VERAMYST) 27.5 MCG/SPRAY nasal spray Place 2 sprays into the nose daily. (Patient taking differently: Place 2 sprays into the nose 2 (two) times daily as needed. Alternates with Flonase) 10 g 0  . GLUCOSAMINE HCL PO Take 1 tablet by mouth daily.    Marland Kitchen HUMALOG KWIKPEN 100 UNIT/ML KwikPen Inject 8-28 Units into the skin See admin instructions. Inject 8 units subcutaneously in the morning, 28 units with lunch & 28 units with supper    . LANTUS SOLOSTAR 100 UNIT/ML Solostar Pen Inject 40-44 Units into the skin See admin instructions. Inject 40 units subcutaneously in the morning & inject 44 units subcutaneously at night.    . linaclotide (LINZESS) 72 MCG capsule Take 72 mcg by mouth every other day.     . magnesium oxide (MAG-OX) 400 MG tablet Take 400 mg by mouth 3 (three) times daily.     . Multiple Vitamin (MULTIVITAMIN WITH MINERALS) TABS tablet Take 1 tablet by mouth daily.    . Olopatadine HCl (PATADAY) 0.2 % SOLN  Place 1 drop into both eyes 2 (two) times daily as needed (allergy eyes.).    Marland Kitchen Omega-3 Fatty Acids (FISH OIL MAXIMUM STRENGTH) 1200 MG CAPS Take 1,000 mg by mouth 3 (three) times daily.   0  . pantoprazole (PROTONIX) 20 MG tablet Take 20 mg by mouth 2 (two) times daily.   0  . patiromer (VELTASSA) 8.4 g packet Take 8.4 g by mouth daily.    . rosuvastatin (CRESTOR) 40 MG tablet Take 1 tablet (40 mg total) by mouth daily. 90 tablet 3  . sodium bicarbonate 650 MG tablet Take 650 mg by mouth 3 (three) times daily.    . sodium polystyrene (KAYEXALATE) 15 GM/60ML suspension Take 60 g by mouth as needed.     . SYMBICORT 160-4.5 MCG/ACT inhaler Inhale 2 puffs into the lungs 2 (  two) times daily.    Marland Kitchen tiZANidine (ZANAFLEX) 4 MG tablet Take 4 mg by mouth 3 (three) times daily as needed for muscle spasms.    Marland Kitchen triamcinolone cream (KENALOG) 0.1 % Apply 1 application topically 2 (two) times daily as needed (skin rash/irritation).      No current facility-administered medications for this visit.    Allergies:   Aspirin and Hydrochlorothiazide    Social History:  The patient  reports that she has been smoking cigarettes. She has a 10.00 pack-year smoking history. She has never used smokeless tobacco. She reports that she does not drink alcohol or use drugs.   Family History:  The patient's family history includes Breast cancer in her sister; Heart attack in her mother; Heart disease in her mother; Hyperlipidemia in her mother; Hypertension in her mother.    ROS:  Please see the history of present illness.   Otherwise, review of systems are positive for none.   All other systems are reviewed and negative.    PHYSICAL EXAM: VS:  BP 140/70 (BP Location: Left Arm, Patient Position: Sitting, Cuff Size: Normal)   Pulse 77   Ht 5\' 3"  (1.6 m)   Wt 162 lb 4 oz (73.6 kg)   SpO2 95%   BMI 28.74 kg/m  , BMI Body mass index is 28.74 kg/m. GEN: Well nourished, well developed, in no acute distress  HEENT:  normal  Neck: no JVD, carotid bruits, or masses Cardiac: RRR; no  rubs, or gallops,no edema . 2/6 SEM at the aortic area.  Respiratory:  clear to auscultation bilaterally, normal work of breathing GI: soft, nontender, nondistended, + BS MS: no deformity or atrophy  Skin: warm and dry, no rash Neuro:  Strength and sensation are intact Psych: euthymic mood, full affect   EKG:  EKG is ordered today. The ekg ordered today demonstrates normal sinus rhythm with left atrial enlargement, left axis deviation and possible old inferior infarct.   Recent Labs: 01/24/2020: ALT 23 02/21/2020: BUN 55; Creatinine, Ser 4.45; Hemoglobin 9.7; Platelets 287; Potassium 5.3; Sodium 138    Lipid Panel    Component Value Date/Time   CHOL 142 11/08/2019 1538   CHOL 291 (H) 07/23/2013 0429   TRIG 437 (H) 11/08/2019 1538   TRIG 550 (H) 07/23/2013 0429   HDL 33 (L) 11/08/2019 1538   HDL 37 (L) 07/23/2013 0429   CHOLHDL 4.3 11/08/2019 1538   VLDL UNABLE TO CALCULATE IF TRIGLYCERIDE OVER 400 mg/dL 11/08/2019 1538   VLDL SEE COMMENT 07/23/2013 0429   LDLCALC UNABLE TO CALCULATE IF TRIGLYCERIDE OVER 400 mg/dL 11/08/2019 1538   LDLCALC SEE COMMENT 07/23/2013 0429   LDLDIRECT 48.6 11/08/2019 1538      Wt Readings from Last 3 Encounters:  03/10/20 162 lb 4 oz (73.6 kg)  02/21/20 162 lb (73.5 kg)  01/28/20 162 lb 0.6 oz (73.5 kg)        ASSESSMENT AND PLAN:  1.  Coronary artery disease involving native coronary arteries with other forms of angina: Symptoms include exertional dyspnea without chest pain.  She reports improvement in her symptoms over the last few months.  I recommend continuing medical therapy especially in the setting of advanced chronic kidney disease approaching the need for dialysis.  2. Essential hypertension: Blood pressure is better controlled since he was switched to carvedilol.  She is no longer on ACE inhibitor or ARB due to hyperkalemia and advanced chronic kidney disease.   Continue amlodipine.  3. Hyperlipidemia: Continue treatment with rosuvastatin  40 mg daily.  Most recent lipid profile showed an LDL of 49.  She did have elevated triglyceride and currently is taking fish oil.  4. Chronic kidney disease: Followed by nephrology.   5. Tobacco use:  I again discussed with her the importance of smoking cessation.  6.  Pulmonary nodules: Follow-up CT scan was recommended.  The patient likely has underlying COPD to explain some of her shortness of breath as well.  Consider pulmonary evaluation.  7.  Preop cardiovascular evaluation for colonoscopy: Given her multiple comorbidities, she is considered at moderate risk.  No need for further cardiac testing before.   Disposition:   FU with me in 6 months  Signed,  Kathlyn Sacramento, MD  03/10/2020 4:20 PM     [facility]

## 2020-03-19 ENCOUNTER — Other Ambulatory Visit: Payer: Self-pay | Admitting: *Deleted

## 2020-03-19 DIAGNOSIS — D508 Other iron deficiency anemias: Secondary | ICD-10-CM

## 2020-03-19 DIAGNOSIS — N184 Chronic kidney disease, stage 4 (severe): Secondary | ICD-10-CM

## 2020-03-19 DIAGNOSIS — D631 Anemia in chronic kidney disease: Secondary | ICD-10-CM

## 2020-03-20 ENCOUNTER — Encounter: Payer: Self-pay | Admitting: Internal Medicine

## 2020-03-20 ENCOUNTER — Inpatient Hospital Stay: Payer: Medicare Other

## 2020-03-20 ENCOUNTER — Other Ambulatory Visit: Payer: Self-pay

## 2020-03-20 ENCOUNTER — Inpatient Hospital Stay: Payer: Medicare Other | Attending: Internal Medicine

## 2020-03-20 ENCOUNTER — Inpatient Hospital Stay (HOSPITAL_BASED_OUTPATIENT_CLINIC_OR_DEPARTMENT_OTHER): Payer: Medicare Other | Admitting: Internal Medicine

## 2020-03-20 DIAGNOSIS — N184 Chronic kidney disease, stage 4 (severe): Secondary | ICD-10-CM | POA: Insufficient documentation

## 2020-03-20 DIAGNOSIS — E875 Hyperkalemia: Secondary | ICD-10-CM | POA: Diagnosis not present

## 2020-03-20 DIAGNOSIS — D631 Anemia in chronic kidney disease: Secondary | ICD-10-CM | POA: Insufficient documentation

## 2020-03-20 DIAGNOSIS — D508 Other iron deficiency anemias: Secondary | ICD-10-CM

## 2020-03-20 LAB — BASIC METABOLIC PANEL
Anion gap: 11 (ref 5–15)
BUN: 47 mg/dL — ABNORMAL HIGH (ref 8–23)
CO2: 20 mmol/L — ABNORMAL LOW (ref 22–32)
Calcium: 8.7 mg/dL — ABNORMAL LOW (ref 8.9–10.3)
Chloride: 109 mmol/L (ref 98–111)
Creatinine, Ser: 3.95 mg/dL — ABNORMAL HIGH (ref 0.44–1.00)
GFR calc Af Amer: 13 mL/min — ABNORMAL LOW (ref >60)
GFR calc non Af Amer: 11 mL/min — ABNORMAL LOW (ref >60)
Glucose, Bld: 226 mg/dL — ABNORMAL HIGH (ref 70–99)
Potassium: 5.5 mmol/L — ABNORMAL HIGH (ref 3.5–5.1)
Sodium: 140 mmol/L (ref 135–145)

## 2020-03-20 LAB — CBC WITH DIFFERENTIAL/PLATELET
Abs Immature Granulocytes: 0.04 10*3/uL (ref 0.00–0.07)
Basophils Absolute: 0 10*3/uL (ref 0.0–0.1)
Basophils Relative: 1 %
Eosinophils Absolute: 0.1 10*3/uL (ref 0.0–0.5)
Eosinophils Relative: 2 %
HCT: 34.5 % — ABNORMAL LOW (ref 36.0–46.0)
Hemoglobin: 10.8 g/dL — ABNORMAL LOW (ref 12.0–15.0)
Immature Granulocytes: 1 %
Lymphocytes Relative: 30 %
Lymphs Abs: 2 10*3/uL (ref 0.7–4.0)
MCH: 28.6 pg (ref 26.0–34.0)
MCHC: 31.3 g/dL (ref 30.0–36.0)
MCV: 91.3 fL (ref 80.0–100.0)
Monocytes Absolute: 0.7 10*3/uL (ref 0.1–1.0)
Monocytes Relative: 10 %
Neutro Abs: 3.8 10*3/uL (ref 1.7–7.7)
Neutrophils Relative %: 56 %
Platelets: 336 10*3/uL (ref 150–400)
RBC: 3.78 MIL/uL — ABNORMAL LOW (ref 3.87–5.11)
RDW: 16.1 % — ABNORMAL HIGH (ref 11.5–15.5)
WBC: 6.6 10*3/uL (ref 4.0–10.5)
nRBC: 0 % (ref 0.0–0.2)

## 2020-03-20 NOTE — Progress Notes (Signed)
[place] OFFICE PROGRESS NOTE  Patient Care Team: Marguerita Merles, MD as PCP - General (Family Medicine) Wellington Hampshire, MD as PCP - Cardiology (Cardiology)   SUMMARY OF ONCOLOGIC HISTORY:  # March 2017- Anemia- Hb 8; plan Iv iron s/p IV iron x2 [[april 2017]; [place] May 2017.   # CKD [? nephrologist as per pt; ? Dr.Swain]/ CAD  # AUG 2016- ELEVATED K/L ratio 1.99; SPEP- Neg.    INTERVAL HISTORY:  69 year-old pleasant female patient with above history of anemia secondary to iron deficiency/CKD.  Patient complains of cramping in the legs.  Denies any worsening swelling.  Shortness of breath on exertion not any worse.  No blood in stools or black or stools.  Review of Systems  Constitutional: Positive for malaise/fatigue. Negative for chills, diaphoresis, fever and weight loss.  HENT: Negative for nosebleeds and sore throat.   Eyes: Negative for double vision.  Respiratory: Positive for shortness of breath. Negative for cough, hemoptysis, sputum production and wheezing.   Cardiovascular: Negative for chest pain, palpitations, orthopnea and leg swelling.  Gastrointestinal: Negative for abdominal pain, blood in stool, constipation, diarrhea, heartburn, melena, nausea and vomiting.  Genitourinary: Negative for dysuria, frequency and urgency.  Musculoskeletal: Negative for back pain and joint pain.  Skin: Negative.  Negative for itching and rash.  Neurological: Negative for dizziness, tingling, focal weakness, weakness and headaches.  Endo/Heme/Allergies: Does not bruise/bleed easily.  Psychiatric/Behavioral: Negative for depression. The patient is not nervous/anxious and does not have insomnia.      PAST MEDICAL HISTORY :  Past Medical History:  Diagnosis Date  . Anemia   . Anxiety   . Arthritis   . CKD (chronic kidney disease), stage IV ([place])   . Coronary artery disease    a. 03/2014 NSTEMI/Cath: LM nl, LAD 19m, 60d, D1 60, D2 nl, d3 40, LCX 100  w/ L->L collats, RCA non dom, min irregs, EF 55%-->Med Rx; b. 09/2019 MV: EF 30-44%, large basal inf, basal inflat, mid inf, mid inflat, apical defect - partially reversible.  . DDD (degenerative disc disease)    a. s/p upper back surgery spring 2015.  . Diabetes mellitus without complication ([place])   . Diverticulosis   . Fibromyalgia   . GERD (gastroesophageal reflux disease)   . Hiatal hernia   . Hyperlipidemia   . Hypertensive heart disease    a. 03/2015 Echo: 55-60%, mild conc LVH; b. 10/2019 Echo: EF 65-70%, mod LVH.  . Mitral stenosis    a. 10/2019 Echo: EF 65-70%, mod LVH. No rwma. Mildly dil LA. MV Ca2+ w/ mod-sev MS (mean grad 74mmHg, Valve area 1.1-1.2). Mild to mod AoV sclerosis w/o stenosis.  . Syncope and collapse    a. Early 03/2014->did not seek medical attn.  . Tobacco abuse     PAST SURGICAL HISTORY :   Past Surgical History:  Procedure Laterality Date  . BREAST CYST ASPIRATION Right 2005   NEG  . CARDIAC CATHETERIZATION  06/2005   [facility]  . CARDIAC CATHETERIZATION  04/2014   [facility]  . COLONOSCOPY WITH PROPOFOL N/A 04/27/2016   Procedure: COLONOSCOPY WITH PROPOFOL;  Surgeon: Manya Silvas, MD;  Location[facility] ENDOSCOPY;  Service: Endoscopy;  Laterality: N/A;  . CORONARY ANGIOPLASTY    . DILATATION & CURETTAGE/HYSTEROSCOPY WITH MYOSURE N/A 09/18/2015   Procedure: DILATATION & CURETTAGE/HYSTEROSCOPY WITH MYOSURE;  Surgeon: Boykin Nearing, MD;  Location: [facility] ORS;  Service: Gynecology;  Laterality: N/A;  . ESOPHAGOGASTRODUODENOSCOPY (EGD) WITH PROPOFOL  N/A 04/27/2016   Procedure: ESOPHAGOGASTRODUODENOSCOPY (EGD) WITH PROPOFOL;  Surgeon: Manya Silvas, MD;  Location: [facility] ENDOSCOPY;  Service: Endoscopy;  Laterality: N/A;  . NECK SURGERY     Degenerative Disk Disease and removal of a spinal cyst  . TEE WITHOUT CARDIOVERSION N/A 12/30/2019   Procedure: TRANSESOPHAGEAL ECHOCARDIOGRAM (TEE);  Surgeon: Wellington Hampshire, MD;  Location: [facility] ORS;  Service: Cardiovascular;   Laterality: N/A;  . TUBAL LIGATION      FAMILY HISTORY :   Family History  Problem Relation Age of Onset  . Heart disease Mother   . Heart attack Mother   . Hypertension Mother   . Hyperlipidemia Mother   . Breast cancer Sister     SOCIAL HISTORY:   Social History   Tobacco Use  . Smoking status: Current Every Day Smoker    Packs/day: 0.25    Years: 40.00    Pack years: 10.00    Types: Cigarettes  . Smokeless tobacco: Never Used  Substance Use Topics  . Alcohol use: No  . Drug use: No    ALLERGIES:  is allergic to aspirin and hydrochlorothiazide.  MEDICATIONS:  Current Outpatient Medications  Medication Sig Dispense Refill  . allopurinol (ZYLOPRIM) 100 MG tablet Take 100 mg by mouth 2 (two) times daily.    Marland Kitchen amLODipine (NORVASC) 10 MG tablet Take 1 tablet (10 mg total) by mouth daily. 90 tablet 1  . Calcium Carbonate-Vitamin D (CALCIUM 600+D) 600-400 MG-UNIT tablet Take 1 tablet by mouth daily.    . carvedilol (COREG) 12.5 MG tablet Take 1 tablet (12.5 mg total) by mouth 2 (two) times daily. 180 tablet 0  . cetirizine (ZYRTEC) 10 MG tablet Take 10 mg by mouth at bedtime.     . cholecalciferol (VITAMIN D) 25 MCG (1000 UT) tablet Take 1,000 Units by mouth daily.    . colchicine 0.6 MG tablet Take 0.6 mg by mouth daily as needed (gout flare).     . ferrous sulfate 325 (65 FE) MG tablet Take 325 mg by mouth every other day.    Marland Kitchen GLUCOSAMINE HCL PO Take 1 tablet by mouth daily.    Marland Kitchen HUMALOG KWIKPEN 100 UNIT/ML KwikPen Inject 8-28 Units into the skin See admin instructions. Inject 8 units subcutaneously in the morning, 28 units with lunch & 28 units with supper    . LANTUS SOLOSTAR 100 UNIT/ML Solostar Pen Inject 40-44 Units into the skin See admin instructions. Inject 40 units subcutaneously in the morning & inject 44 units subcutaneously at night.    . linaclotide (LINZESS) 72 MCG capsule Take 72 mcg by mouth every other day.     . magnesium oxide (MAG-OX) 400 MG tablet Take  400 mg by mouth 3 (three) times daily.     . Multiple Vitamin (MULTIVITAMIN WITH MINERALS) TABS tablet Take 1 tablet by mouth daily.    . Olopatadine HCl (PATADAY) 0.2 % SOLN Place 1 drop into both eyes 2 (two) times daily as needed (allergy eyes.).    Marland Kitchen Omega-3 Fatty Acids (FISH OIL MAXIMUM STRENGTH) 1200 MG CAPS Take 1,000 mg by mouth 3 (three) times daily.   0  . pantoprazole (PROTONIX) 20 MG tablet Take 20 mg by mouth 2 (two) times daily.   0  . patiromer (VELTASSA) 8.4 g packet Take 8.4 g by mouth daily.    . rosuvastatin (CRESTOR) 40 MG tablet Take 1 tablet (40 mg total) by mouth daily. 90 tablet 3  . sodium bicarbonate 650 MG tablet  Take 650 mg by mouth 3 (three) times daily.    . SYMBICORT 160-4.5 MCG/ACT inhaler Inhale 2 puffs into the lungs 2 (two) times daily.    Marland Kitchen tiZANidine (ZANAFLEX) 4 MG tablet Take 4 mg by mouth 3 (three) times daily as needed for muscle spasms.    Marland Kitchen triamcinolone cream (KENALOG) 0.1 % Apply 1 application topically 2 (two) times daily as needed (skin rash/irritation).     . fluticasone (FLONASE) 50 MCG/ACT nasal spray Place 1 spray into both nostrils 2 (two) times daily as needed.     . fluticasone (VERAMYST) 27.5 MCG/SPRAY nasal spray Place 2 sprays into the nose daily. (Patient not taking: Reported on 03/20/2020) 10 g 0  . sodium polystyrene (KAYEXALATE) 15 GM/60ML suspension Take 60 g by mouth as needed.      No current facility-administered medications for this visit.    PHYSICAL EXAMINATION:   BP 133/78 (BP Location: Left Arm, Patient Position: Sitting)   Pulse 75   Temp (!) 96 F (35.6 C) (Tympanic)   Resp 18   Wt 164 lb 3.2 oz (74.5 kg)   SpO2 100%   BMI 29.09 kg/m   Filed Weights   03/20/20 0957  Weight: 164 lb 3.2 oz (74.5 kg)    Physical Exam  Constitutional: She is oriented to person, place, and time and well-developed, well-nourished, and in no distress.  HENT:  Head: Normocephalic and atraumatic.  Mouth/Throat: Oropharynx is clear  and moist. No oropharyngeal exudate.  Eyes: Pupils are equal, round, and reactive to light.  Cardiovascular: Normal rate and regular rhythm.  Pulmonary/Chest: No respiratory distress. She has no wheezes.  Abdominal: Soft. Bowel sounds are normal. She exhibits no distension and no mass. There is no abdominal tenderness. There is no rebound and no guarding.  Musculoskeletal:        General: No tenderness or edema. Normal range of motion.     Cervical back: Normal range of motion and neck supple.  Neurological: She is alert and oriented to person, place, and time.  Skin: Skin is warm.  Psychiatric: Affect normal.    LABORATORY DATA:  I have reviewed the data as listed    Component Value Date/Time   NA 140 03/20/2020 0937   NA 139 10/21/2014 1332   K 5.5 (H) 03/20/2020 0937   K 4.4 10/21/2014 1332   CL 109 03/20/2020 0937   CL 108 (H) 10/21/2014 1332   CO2 20 (L) 03/20/2020 0937   CO2 24 10/21/2014 1332   GLUCOSE 226 (H) 03/20/2020 0937   GLUCOSE 103 (H) 10/21/2014 1332   BUN 47 (H) 03/20/2020 0937   BUN 21 (H) 10/21/2014 1332   CREATININE 3.95 (H) 03/20/2020 0937   CREATININE 1.45 (H) 10/21/2014 1332   CALCIUM 8.7 (L) 03/20/2020 0937   CALCIUM 9.1 10/21/2014 1332   PROT 7.1 01/24/2020 1305   PROT 7.5 10/21/2014 1332   ALBUMIN 3.8 01/24/2020 1305   ALBUMIN 3.6 10/21/2014 1332   AST 17 01/24/2020 1305   AST 15 10/21/2014 1332   ALT 23 01/24/2020 1305   ALT 27 10/21/2014 1332   ALKPHOS 86 01/24/2020 1305   ALKPHOS 94 10/21/2014 1332   BILITOT 0.3 01/24/2020 1305   BILITOT 0.2 10/21/2014 1332   GFRNONAA 11 (L) 03/20/2020 0937   GFRNONAA 39 (L) 10/21/2014 1332   GFRNONAA 27 (L) 05/10/2014 0031   GFRAA 13 (L) 03/20/2020 0937   GFRAA 47 (L) 10/21/2014 1332   GFRAA 32 (L) 05/10/2014 0031  No results found for: SPEP, UPEP  Lab Results  Component Value Date   WBC 6.6 03/20/2020   NEUTROABS 3.8 03/20/2020   HGB 10.8 (L) 03/20/2020   HCT 34.5 (L) 03/20/2020   MCV 91.3  03/20/2020   PLT 336 03/20/2020      Chemistry      Component Value Date/Time   NA 140 03/20/2020 0937   NA 139 10/21/2014 1332   K 5.5 (H) 03/20/2020 0937   K 4.4 10/21/2014 1332   CL 109 03/20/2020 0937   CL 108 (H) 10/21/2014 1332   CO2 20 (L) 03/20/2020 0937   CO2 24 10/21/2014 1332   BUN 47 (H) 03/20/2020 0937   BUN 21 (H) 10/21/2014 1332   CREATININE 3.95 (H) 03/20/2020 0937   CREATININE 1.45 (H) 10/21/2014 1332      Component Value Date/Time   CALCIUM 8.7 (L) 03/20/2020 0937   CALCIUM 9.1 10/21/2014 1332   ALKPHOS 86 01/24/2020 1305   ALKPHOS 94 10/21/2014 1332   AST 17 01/24/2020 1305   AST 15 10/21/2014 1332   ALT 23 01/24/2020 1305   ALT 27 10/21/2014 1332   BILITOT 0.3 01/24/2020 1305   BILITOT 0.2 10/21/2014 1332       ASSESSMENT & PLAN:   Anemia due to stage 4 chronic kidney disease (HCC) # Anemia Iron deficiency- hemoglobin today 10.3;  On PO iron once a day.  Stable; S/p Ferrahem. HOLD retacrt today.  # Mild hyperkalemia.  Potassium 5.5. recommend be compliant [sec to constipation] with Veltessa; Recommend close monitoring with nephrology.  # CKD-stage IV creatinine-GFR-12 slow worsening overall stable; recommend close follow up with nephrology.  # DISPOSITION: # HOLD Retacrit today # 1 month- cbc/bmp- possible retacrit # Follow-up 2 months/MD-labs CBC BMP- possible retacrit- Dr.B  Cc; Dr.Swain      Cammie Sickle, MD 03/20/2020 10:52 AM

## 2020-03-20 NOTE — Assessment & Plan Note (Addendum)
#   Anemia Iron deficiency- hemoglobin today 10.3;  On PO iron once a day.  Stable; S/p Ferrahem. HOLD retacrt today.  # Mild hyperkalemia.  Potassium 5.5. recommend be compliant [sec to constipation] with Veltessa; Recommend close monitoring with nephrology.  # CKD-stage IV creatinine-GFR-12 slow worsening overall stable; recommend close follow up with nephrology.  # DISPOSITION: # HOLD Retacrit today # 1 month- cbc/bmp- possible retacrit # Follow-up 2 months/MD-labs CBC BMP- possible retacrit- Dr.B  Cc; Dr.Swain

## 2020-03-20 NOTE — Patient Instructions (Signed)
#   Recommend follow up with Dr.Swain/kidney doctor re: your elevated potassium.

## 2020-03-20 NOTE — Progress Notes (Signed)
Pt in for follow up, states "alittle weak in the legs at time".

## 2020-03-21 ENCOUNTER — Encounter: Payer: Self-pay | Admitting: Emergency Medicine

## 2020-03-21 ENCOUNTER — Other Ambulatory Visit: Payer: Self-pay

## 2020-03-21 ENCOUNTER — Emergency Department
Admission: EM | Admit: 2020-03-21 | Discharge: 2020-03-21 | Disposition: A | Discharge status: Home / Self Care | Payer: Medicare Other | Attending: Emergency Medicine | Admitting: Emergency Medicine

## 2020-03-21 DIAGNOSIS — R899 Unspecified abnormal finding in specimens from other organs, systems and tissues: Secondary | ICD-10-CM | POA: Diagnosis not present

## 2020-03-21 DIAGNOSIS — I129 Hypertensive chronic kidney disease with stage 1 through stage 4 chronic kidney disease, or unspecified chronic kidney disease: Secondary | ICD-10-CM | POA: Insufficient documentation

## 2020-03-21 DIAGNOSIS — Z79899 Other long term (current) drug therapy: Secondary | ICD-10-CM | POA: Diagnosis not present

## 2020-03-21 DIAGNOSIS — E1122 Type 2 diabetes mellitus with diabetic chronic kidney disease: Secondary | ICD-10-CM | POA: Insufficient documentation

## 2020-03-21 DIAGNOSIS — E875 Hyperkalemia: Secondary | ICD-10-CM | POA: Diagnosis present

## 2020-03-21 DIAGNOSIS — F17210 Nicotine dependence, cigarettes, uncomplicated: Secondary | ICD-10-CM | POA: Insufficient documentation

## 2020-03-21 DIAGNOSIS — Z794 Long term (current) use of insulin: Secondary | ICD-10-CM | POA: Insufficient documentation

## 2020-03-21 DIAGNOSIS — N184 Chronic kidney disease, stage 4 (severe): Secondary | ICD-10-CM | POA: Insufficient documentation

## 2020-03-21 LAB — CBC WITH DIFFERENTIAL/PLATELET
Abs Immature Granulocytes: 0.04 10*3/uL (ref 0.00–0.07)
Basophils Absolute: 0 10*3/uL (ref 0.0–0.1)
Basophils Relative: 1 %
Eosinophils Absolute: 0.1 10*3/uL (ref 0.0–0.5)
Eosinophils Relative: 2 %
HCT: 35.1 % — ABNORMAL LOW (ref 36.0–46.0)
Hemoglobin: 11.1 g/dL — ABNORMAL LOW (ref 12.0–15.0)
Immature Granulocytes: 1 %
Lymphocytes Relative: 33 %
Lymphs Abs: 2 10*3/uL (ref 0.7–4.0)
MCH: 28.5 pg (ref 26.0–34.0)
MCHC: 31.6 g/dL (ref 30.0–36.0)
MCV: 90.2 fL (ref 80.0–100.0)
Monocytes Absolute: 0.5 10*3/uL (ref 0.1–1.0)
Monocytes Relative: 9 %
Neutro Abs: 3.4 10*3/uL (ref 1.7–7.7)
Neutrophils Relative %: 54 %
Platelets: 352 10*3/uL (ref 150–400)
RBC: 3.89 MIL/uL (ref 3.87–5.11)
RDW: 16.2 % — ABNORMAL HIGH (ref 11.5–15.5)
WBC: 6.2 10*3/uL (ref 4.0–10.5)
nRBC: 0 % (ref 0.0–0.2)

## 2020-03-21 LAB — BASIC METABOLIC PANEL
Anion gap: 10 (ref 5–15)
BUN: 47 mg/dL — ABNORMAL HIGH (ref 8–23)
CO2: 19 mmol/L — ABNORMAL LOW (ref 22–32)
Calcium: 9.1 mg/dL (ref 8.9–10.3)
Chloride: 113 mmol/L — ABNORMAL HIGH (ref 98–111)
Creatinine, Ser: 4.03 mg/dL — ABNORMAL HIGH (ref 0.44–1.00)
GFR calc Af Amer: 12 mL/min — ABNORMAL LOW (ref >60)
GFR calc non Af Amer: 11 mL/min — ABNORMAL LOW (ref >60)
Glucose, Bld: 152 mg/dL — ABNORMAL HIGH (ref 70–99)
Potassium: 5 mmol/L (ref 3.5–5.1)
Sodium: 142 mmol/L (ref 135–145)

## 2020-03-21 NOTE — ED Provider Notes (Signed)
[facility] Emergency Department Provider Note ____________________________________________   First MD Initiated Contact with Patient 03/21/20 1115     (approximate)  I have reviewed the triage vital signs and the nursing notes.   HISTORY  Chief Complaint Hyperkalemia    HPI Caitlin Bass is a 69 y.o. female with PMH as noted below including CKD who presents due to an abnormal lab result.  The patient was told that she had to come to the hospital because her potassium level was 6.8 on recent labs from [facility].  However, the patient had blood drawn yesterday at the cancer center with the result of 5.5.  The patient denies any acute symptoms.  Past Medical History:  Diagnosis Date  . Anemia   . Anxiety   . Arthritis   . CKD (chronic kidney disease), stage IV ([place])   . Coronary artery disease    a. 03/2014 NSTEMI/Cath: LM nl, LAD 35m, 60d, D1 60, D2 nl, d3 40, LCX 100 w/ L->L collats, RCA non dom, min irregs, EF 55%-->Med Rx; b. 09/2019 MV: EF 30-44%, large basal inf, basal inflat, mid inf, mid inflat, apical defect - partially reversible.  . DDD (degenerative disc disease)    a. s/p upper back surgery spring 2015.  . Diabetes mellitus without complication ([place])   . Diverticulosis   . Fibromyalgia   . GERD (gastroesophageal reflux disease)   . Hiatal hernia   . Hyperlipidemia   . Hypertensive heart disease    a. 03/2015 Echo: 55-60%, mild conc LVH; b. 10/2019 Echo: EF 65-70%, mod LVH.  . Mitral stenosis    a. 10/2019 Echo: EF 65-70%, mod LVH. No rwma. Mildly dil LA. MV Ca2+ w/ mod-sev MS (mean grad 28mmHg, Valve area 1.1-1.2). Mild to mod AoV sclerosis w/o stenosis.  . Syncope and collapse    a. Early 03/2014->did not seek medical attn.  . Tobacco abuse     Patient Active Problem List   Diagnosis Date Noted  . Mitral valve stenosis   . Anemia due to stage 4 chronic kidney disease ([place]) 02/22/2017  . Other iron deficiency anemia 02/26/2016   . Hypertensive heart disease   . Tobacco abuse   . Hyperlipidemia   . Coronary artery disease   . Diabetes mellitus without complication ([place])   . Hypertension   . CKD (chronic kidney disease), stage IV ([place])   . GERD (gastroesophageal reflux disease)   . DDD (degenerative disc disease)     Past Surgical History:  Procedure Laterality Date  . BREAST CYST ASPIRATION Right 2005   NEG  . CARDIAC CATHETERIZATION  06/2005   [facility]  . CARDIAC CATHETERIZATION  04/2014   [facility]  . COLONOSCOPY WITH PROPOFOL N/A 04/27/2016   Procedure: COLONOSCOPY WITH PROPOFOL;  Surgeon: Manya Silvas, MD;  Location: [facility] ENDOSCOPY;  Service: Endoscopy;  Laterality: N/A;  . CORONARY ANGIOPLASTY    . DILATATION & CURETTAGE/HYSTEROSCOPY WITH MYOSURE N/A 09/18/2015   Procedure: DILATATION & CURETTAGE/HYSTEROSCOPY WITH MYOSURE;  Surgeon: Boykin Nearing, MD;  Location: [facility] ORS;  Service: Gynecology;  Laterality: N/A;  . ESOPHAGOGASTRODUODENOSCOPY (EGD) WITH PROPOFOL N/A 04/27/2016   Procedure: ESOPHAGOGASTRODUODENOSCOPY (EGD) WITH PROPOFOL;  Surgeon: Manya Silvas, MD;  Location: [facility] ENDOSCOPY;  Service: Endoscopy;  Laterality: N/A;  . NECK SURGERY     Degenerative Disk Disease and removal of a spinal cyst  . TEE WITHOUT CARDIOVERSION N/A 12/30/2019   Procedure: TRANSESOPHAGEAL ECHOCARDIOGRAM (TEE);  Surgeon: Wellington Hampshire, MD;  Location:  [facility] ORS;  Service: Cardiovascular;  Laterality: N/A;  . TUBAL LIGATION      Prior to Admission medications   Medication Sig Start Date End Date Taking? Authorizing Provider  allopurinol (ZYLOPRIM) 100 MG tablet Take 100 mg by mouth 2 (two) times daily.    [provider]  amLODipine (NORVASC) 10 MG tablet Take 1 tablet (10 mg total) by mouth daily. 01/10/20 07/08/20  Loel Dubonnet, NP  Calcium Carbonate-Vitamin D (CALCIUM 600+D) 600-400 MG-UNIT tablet Take 1 tablet by mouth daily.    [provider]  carvedilol (COREG) 12.5 MG tablet Take 1  tablet (12.5 mg total) by mouth 2 (two) times daily. 11/19/19   Theora Gianotti, NP  cetirizine (ZYRTEC) 10 MG tablet Take 10 mg by mouth at bedtime.     [provider]  cholecalciferol (VITAMIN D) 25 MCG (1000 UT) tablet Take 1,000 Units by mouth daily.    [provider]  colchicine 0.6 MG tablet Take 0.6 mg by mouth daily as needed (gout flare).  08/14/15   [provider]  ferrous sulfate 325 (65 FE) MG tablet Take 325 mg by mouth every other day.    [provider]  fluticasone (FLONASE) 50 MCG/ACT nasal spray Place 1 spray into both nostrils 2 (two) times daily as needed.     [provider]  fluticasone (VERAMYST) 27.5 MCG/SPRAY nasal spray Place 2 sprays into the nose daily. Patient not taking: Reported on 03/20/2020 01/28/20   Sherrie George B, FNP  GLUCOSAMINE HCL PO Take 1 tablet by mouth daily.    [provider]  HUMALOG KWIKPEN 100 UNIT/ML KwikPen Inject 8-28 Units into the skin See admin instructions. Inject 8 units subcutaneously in the morning, 28 units with lunch & 28 units with supper 11/13/19   [provider]  LANTUS SOLOSTAR 100 UNIT/ML Solostar Pen Inject 40-44 Units into the skin See admin instructions. Inject 40 units subcutaneously in the morning & inject 44 units subcutaneously at night. 11/13/19   [provider]  linaclotide (LINZESS) 72 MCG capsule Take 72 mcg by mouth every other day.  01/03/17 11/18/28  [provider]  magnesium oxide (MAG-OX) 400 MG tablet Take 400 mg by mouth 3 (three) times daily.     [provider]  Multiple Vitamin (MULTIVITAMIN WITH MINERALS) TABS tablet Take 1 tablet by mouth daily.    [provider]  Olopatadine HCl (PATADAY) 0.2 % SOLN Place 1 drop into both eyes 2 (two) times daily as needed (allergy eyes.).    [provider]  Omega-3 Fatty Acids (FISH OIL MAXIMUM STRENGTH) 1200 MG CAPS Take 1,000 mg by mouth 3 (three) times  daily.  02/10/16   [provider]  pantoprazole (PROTONIX) 20 MG tablet Take 20 mg by mouth 2 (two) times daily.  07/25/16   [provider]  patiromer (VELTASSA) 8.4 g packet Take 8.4 g by mouth daily.    [provider]  rosuvastatin (CRESTOR) 40 MG tablet Take 1 tablet (40 mg total) by mouth daily. 02/13/20 05/13/20  Wellington Hampshire, MD  sodium bicarbonate 650 MG tablet Take 650 mg by mouth 3 (three) times daily.    [provider]  sodium polystyrene (KAYEXALATE) 15 GM/60ML suspension Take 60 g by mouth as needed.     [provider]  SYMBICORT 160-4.5 MCG/ACT inhaler Inhale 2 puffs into the lungs 2 (two) times daily. 11/13/19   [provider]  tiZANidine (ZANAFLEX) 4 MG tablet Take  4 mg by mouth 3 (three) times daily as needed for muscle spasms. 08/22/19   [provider]  triamcinolone cream (KENALOG) 0.1 % Apply 1 application topically 2 (two) times daily as needed (skin rash/irritation).     [provider]    Allergies Aspirin and Hydrochlorothiazide  Family History  Problem Relation Age of Onset  . Heart disease Mother   . Heart attack Mother   . Hypertension Mother   . Hyperlipidemia Mother   . Breast cancer Sister     Social History Social History   Tobacco Use  . Smoking status: Current Every Day Smoker    Packs/day: 0.25    Years: 40.00    Pack years: 10.00    Types: Cigarettes  . Smokeless tobacco: Never Used  Substance Use Topics  . Alcohol use: No  . Drug use: No    Review of Systems  Constitutional: No fever/chills Eyes: No visual changes. ENT: No sore throat. Cardiovascular: Denies chest pain. Respiratory: Denies shortness of breath. Gastrointestinal: No vomiting or diarrhea.  Genitourinary: Negative for dysuria.  Musculoskeletal: Negative for back pain. Skin: Negative for rash. Neurological: Negative for headache.   ____________________________________________   PHYSICAL  EXAM:  VITAL SIGNS: ED Triage Vitals  Enc Vitals Group     BP 03/21/20 1057 (!) 169/89     Pulse Rate 03/21/20 1057 88     Resp 03/21/20 1057 16     Temp 03/21/20 1057 97.8 F (36.6 C)     Temp src --      SpO2 03/21/20 1057 98 %     Weight --      Height --      Head Circumference --      Peak Flow --      Pain Score 03/21/20 1105 0     Pain Loc --      Pain Edu? --      Excl. in [place]? --     Constitutional: Alert and oriented. Well appearing and in no acute distress. Eyes: Conjunctivae are normal.  Head: Atraumatic. Nose: No congestion/rhinnorhea. Mouth/Throat: Mucous membranes are moist.   Neck: Normal range of motion.  Cardiovascular: Good peripheral circulation. Respiratory: Normal respiratory effort.  No retractions.  Gastrointestinal: No distention.  Musculoskeletal: Extremities warm and well perfused.  Neurologic:  Normal speech and language. No gross focal neurologic deficits are appreciated.  Skin:  Skin is warm and dry. No rash noted. Psychiatric: Mood and affect are normal. Speech and behavior are normal.  ____________________________________________   LABS (all labs ordered are listed, but only abnormal results are displayed)  Labs Reviewed  CBC WITH DIFFERENTIAL/PLATELET - Abnormal; Notable for the following components:      Result Value   Hemoglobin 11.1 (*)    HCT 35.1 (*)    RDW 16.2 (*)    All other components within normal limits  BASIC METABOLIC PANEL - Abnormal; Notable for the following components:   Chloride 113 (*)    CO2 19 (*)    Glucose, Bld 152 (*)    BUN 47 (*)    Creatinine, Ser 4.03 (*)    GFR calc non Af Amer 11 (*)    GFR calc Af Amer 12 (*)    All other components within normal limits   ____________________________________________  EKG   ____________________________________________  RADIOLOGY    ____________________________________________   PROCEDURES  Procedure(s) performed: No  Procedures  Critical Care  performed: No ____________________________________________   INITIAL IMPRESSION / ASSESSMENT AND  PLAN / ED COURSE  Pertinent labs & imaging results that were available during my care of the patient were reviewed by me and considered in my medical decision making (see chart for details).  69 year old female with PMH as noted above presents for evaluation after recent labs apparently showed an elevated potassium.  I reviewed the past medical records in Epic and I see a result from yesterday with a potassium of 5.5.  The patient is asymptomatic.  Labs today show a potassium of 5.0.  The BMP and CBC are otherwise all within normal limits for the patient.  At this time, there is no evidence of actual hyperkalemia.  I suspect that the prior results were hemolyzed.  I counseled the patient on the lab results today.  She is stable for discharge home.  Return precautions given, and she expresses understanding. ____________________________________________   FINAL CLINICAL IMPRESSION(S) / ED DIAGNOSES  Final diagnoses:  Abnormal laboratory test result      NEW MEDICATIONS STARTED DURING THIS VISIT:  Discharge Medication List as of 03/21/2020 11:48 AM       Note:  This document was prepared using Dragon voice recognition software and may include unintentional dictation errors.   Arta Silence, MD 03/21/20 1209

## 2020-03-21 NOTE — Discharge Instructions (Addendum)
Your potassium level today is normal.  Follow-up with your regular doctors as scheduled.  Return to the ER for any new or worsening symptoms that concern you.

## 2020-03-21 NOTE — ED Notes (Signed)
Pt states received called that her potassium level was abnormal. Pt denies any pain or discomfort at this time. Pt A&O x4.

## 2020-03-21 NOTE — ED Triage Notes (Signed)
Pt to ED via POV stating that she was told that she needed to come in because her potassium was 6.8. Pt states that her blood work was done yesterday. Pt denies any new symptoms. Pt is in NAD.

## 2020-03-21 NOTE — ED Notes (Signed)
Pt verbalized understanding of discharge instructions. NAD at this time. 

## 2020-03-21 NOTE — ED Triage Notes (Signed)
FIRST NURSE NOTE: Pt reports potassium level of 6.8 at [facility], however, unable to validate labs in care-everywhere. Pt has labs from yesterday from Oncology had potassium level of 5.5

## 2020-03-24 ENCOUNTER — Telehealth: Payer: Self-pay | Admitting: Cardiovascular Disease

## 2020-03-24 NOTE — Telephone Encounter (Signed)
   Primary Cardiologist: Kathlyn [place], MD  Chart reviewed as part of pre-operative protocol coverage. Most recently the patient was seen by Dr.  Fletcher Anon 03/09/20 and cleared "Preop cardiovascular evaluation for colonoscopy: Given her multiple comorbidities, she is considered at moderate risk.  No need for further cardiac testing before".  I will route this recommendation to the requesting party via Epic fax function and remove from pre-op pool.  Please call with questions.  [place], PA 03/24/2020, 4:20 PM    Leanor Kail, [place] 03/24/2020, 4:19 PM

## 2020-03-24 NOTE — Telephone Encounter (Signed)
   [facility] Pre-operative Risk Assessment    Request for surgical clearance:  1. What type of surgery is being performed? COLONOSCOPY  2. When is this surgery scheduled? 04/06/20  3. What type of clearance is required (medical clearance vs. Pharmacy clearance to hold med vs. Both)? NOT LISTED  4. Are there any medications that need to be held prior to surgery and how long? NOT LISTED  5. Practice name and name of physician performing surgery?  [facility]  6. What is your office phone number [phone]   7.   What is your office fax numbeR [phone]  8.   Anesthesia type (None, local, MAC, general) ? NOT LISTED   Caitlin Bass 03/24/2020, 9:54 AM  _________________________________________________________________   (provider comments below)

## 2020-04-02 ENCOUNTER — Other Ambulatory Visit
Admission: RE | Admit: 2020-04-02 | Discharge: 2020-04-02 | Disposition: A | Discharge status: Home / Self Care | Payer: Medicare Other | Source: Ambulatory Visit | Attending: Internal Medicine | Admitting: Internal Medicine

## 2020-04-02 ENCOUNTER — Other Ambulatory Visit: Payer: Self-pay

## 2020-04-02 DIAGNOSIS — Z20822 Contact with and (suspected) exposure to covid-19: Secondary | ICD-10-CM | POA: Insufficient documentation

## 2020-04-02 DIAGNOSIS — Z01812 Encounter for preprocedural laboratory examination: Secondary | ICD-10-CM | POA: Diagnosis present

## 2020-04-02 LAB — SARS CORONAVIRUS 2 (TAT 6-24 HRS): SARS Coronavirus 2: NEGATIVE

## 2020-04-06 ENCOUNTER — Ambulatory Visit: Payer: Medicare Other | Admitting: Registered Nurse

## 2020-04-06 ENCOUNTER — Encounter: Payer: Self-pay | Admitting: Internal Medicine

## 2020-04-06 ENCOUNTER — Other Ambulatory Visit: Payer: Self-pay

## 2020-04-06 ENCOUNTER — Ambulatory Visit
Admission: RE | Admit: 2020-04-06 | Discharge: 2020-04-06 | Disposition: A | Discharge status: Home / Self Care | Payer: Medicare Other | Attending: Internal Medicine | Admitting: Internal Medicine

## 2020-04-06 ENCOUNTER — Encounter
Admission: RE | Disposition: A | Discharge status: Home / Self Care | Payer: Self-pay | Source: Home / Self Care | Attending: Internal Medicine

## 2020-04-06 DIAGNOSIS — N184 Chronic kidney disease, stage 4 (severe): Secondary | ICD-10-CM | POA: Diagnosis not present

## 2020-04-06 DIAGNOSIS — F17200 Nicotine dependence, unspecified, uncomplicated: Secondary | ICD-10-CM | POA: Diagnosis not present

## 2020-04-06 DIAGNOSIS — Z79899 Other long term (current) drug therapy: Secondary | ICD-10-CM | POA: Insufficient documentation

## 2020-04-06 DIAGNOSIS — G4730 Sleep apnea, unspecified: Secondary | ICD-10-CM | POA: Insufficient documentation

## 2020-04-06 DIAGNOSIS — I119 Hypertensive heart disease without heart failure: Secondary | ICD-10-CM | POA: Insufficient documentation

## 2020-04-06 DIAGNOSIS — E1122 Type 2 diabetes mellitus with diabetic chronic kidney disease: Secondary | ICD-10-CM | POA: Insufficient documentation

## 2020-04-06 DIAGNOSIS — K5730 Diverticulosis of large intestine without perforation or abscess without bleeding: Secondary | ICD-10-CM | POA: Insufficient documentation

## 2020-04-06 DIAGNOSIS — K640 First degree hemorrhoids: Secondary | ICD-10-CM | POA: Insufficient documentation

## 2020-04-06 DIAGNOSIS — I252 Old myocardial infarction: Secondary | ICD-10-CM | POA: Diagnosis not present

## 2020-04-06 DIAGNOSIS — K921 Melena: Secondary | ICD-10-CM | POA: Diagnosis present

## 2020-04-06 DIAGNOSIS — Z794 Long term (current) use of insulin: Secondary | ICD-10-CM | POA: Insufficient documentation

## 2020-04-06 DIAGNOSIS — E785 Hyperlipidemia, unspecified: Secondary | ICD-10-CM | POA: Diagnosis not present

## 2020-04-06 DIAGNOSIS — I2510 Atherosclerotic heart disease of native coronary artery without angina pectoris: Secondary | ICD-10-CM | POA: Insufficient documentation

## 2020-04-06 DIAGNOSIS — K219 Gastro-esophageal reflux disease without esophagitis: Secondary | ICD-10-CM | POA: Insufficient documentation

## 2020-04-06 DIAGNOSIS — Z7951 Long term (current) use of inhaled steroids: Secondary | ICD-10-CM | POA: Insufficient documentation

## 2020-04-06 HISTORY — DX: Sleep apnea, unspecified: G47.30

## 2020-04-06 HISTORY — PX: COLONOSCOPY WITH PROPOFOL: SHX5780

## 2020-04-06 HISTORY — DX: Cardiac arrhythmia, unspecified: I49.9

## 2020-04-06 LAB — GLUCOSE, CAPILLARY
Glucose-Capillary: 94 mg/dL (ref 70–99)
Glucose-Capillary: 178 mg/dL — ABNORMAL HIGH (ref 70–99)

## 2020-04-06 SURGERY — COLONOSCOPY WITH PROPOFOL
Anesthesia: General

## 2020-04-06 MED ORDER — SODIUM CHLORIDE 0.9 % IV SOLN
INTRAVENOUS | Status: DC
  Administered 2020-04-06: 1000 mL via INTRAVENOUS

## 2020-04-06 MED ORDER — LIDOCAINE HCL (CARDIAC) PF 100 MG/5ML IV SOSY
PREFILLED_SYRINGE | INTRAVENOUS | Status: DC | PRN
  Administered 2020-04-06: 80 mg via INTRAVENOUS

## 2020-04-06 MED ORDER — PROPOFOL 10 MG/ML IV BOLUS
INTRAVENOUS | Status: AC
  Filled 2020-04-06: qty 20

## 2020-04-06 MED ORDER — PROPOFOL 10 MG/ML IV BOLUS
INTRAVENOUS | Status: DC | PRN
  Administered 2020-04-06: 70 mg via INTRAVENOUS

## 2020-04-06 MED ORDER — PROPOFOL 500 MG/50ML IV EMUL
INTRAVENOUS | Status: DC | PRN
  Administered 2020-04-06: 125 ug/kg/min via INTRAVENOUS

## 2020-04-06 NOTE — H&P (Signed)
Outpatient short stay form Pre-procedure 04/06/2020 12:19 PM Sheilyn Boehlke K. Alice Reichert, M.D.  Primary Physician: Delight Stare, M.D.  Reason for visit:  Hematochezia  History of present illness:  Patient with chronic constipation has hx of hematochezia. No weight loss, abdominal pain, anorexia. Hx of dysphagia but no longer has any of that.     Current Facility-Administered Medications:  .  0.9 %  sodium chloride infusion, , Intravenous, Continuous, Zailen Albarran, Benay Pike, MD  Medications Prior to Admission  Medication Sig Dispense Refill Last Dose  . allopurinol (ZYLOPRIM) 100 MG tablet Take 100 mg by mouth 2 (two) times daily.     Marland Kitchen amLODipine (NORVASC) 10 MG tablet Take 1 tablet (10 mg total) by mouth daily. 90 tablet 1   . Calcium Carbonate-Vitamin D (CALCIUM 600+D) 600-400 MG-UNIT tablet Take 1 tablet by mouth daily.     . carvedilol (COREG) 12.5 MG tablet Take 1 tablet (12.5 mg total) by mouth 2 (two) times daily. 180 tablet 0   . cetirizine (ZYRTEC) 10 MG tablet Take 10 mg by mouth at bedtime.      . cholecalciferol (VITAMIN D) 25 MCG (1000 UT) tablet Take 1,000 Units by mouth daily.     . colchicine 0.6 MG tablet Take 0.6 mg by mouth daily as needed (gout flare).      . ferrous sulfate 325 (65 FE) MG tablet Take 325 mg by mouth every other day.     . fluticasone (FLONASE) 50 MCG/ACT nasal spray Place 1 spray into both nostrils 2 (two) times daily as needed.      . fluticasone (VERAMYST) 27.5 MCG/SPRAY nasal spray Place 2 sprays into the nose daily. (Patient not taking: Reported on 03/20/2020) 10 g 0   . GLUCOSAMINE HCL PO Take 1 tablet by mouth daily.     Marland Kitchen HUMALOG KWIKPEN 100 UNIT/ML KwikPen Inject 8-28 Units into the skin See admin instructions. Inject 8 units subcutaneously in the morning, 28 units with lunch & 28 units with supper     . LANTUS SOLOSTAR 100 UNIT/ML Solostar Pen Inject 40-44 Units into the skin See admin instructions. Inject 40 units subcutaneously in the morning & inject 44  units subcutaneously at night.     . linaclotide (LINZESS) 72 MCG capsule Take 72 mcg by mouth every other day.      . magnesium oxide (MAG-OX) 400 MG tablet Take 400 mg by mouth 3 (three) times daily.      . Multiple Vitamin (MULTIVITAMIN WITH MINERALS) TABS tablet Take 1 tablet by mouth daily.     . Olopatadine HCl (PATADAY) 0.2 % SOLN Place 1 drop into both eyes 2 (two) times daily as needed (allergy eyes.).     Marland Kitchen Omega-3 Fatty Acids (FISH OIL MAXIMUM STRENGTH) 1200 MG CAPS Take 1,000 mg by mouth 3 (three) times daily.   0   . pantoprazole (PROTONIX) 20 MG tablet Take 20 mg by mouth 2 (two) times daily.   0   . patiromer (VELTASSA) 8.4 g packet Take 8.4 g by mouth daily.     . rosuvastatin (CRESTOR) 40 MG tablet Take 1 tablet (40 mg total) by mouth daily. 90 tablet 3   . sodium bicarbonate 650 MG tablet Take 650 mg by mouth 3 (three) times daily.     . sodium polystyrene (KAYEXALATE) 15 GM/60ML suspension Take 60 g by mouth as needed.      . SYMBICORT 160-4.5 MCG/ACT inhaler Inhale 2 puffs into the lungs 2 (two) times daily.     Marland Kitchen  tiZANidine (ZANAFLEX) 4 MG tablet Take 4 mg by mouth 3 (three) times daily as needed for muscle spasms.     Marland Kitchen triamcinolone cream (KENALOG) 0.1 % Apply 1 application topically 2 (two) times daily as needed (skin rash/irritation).         Allergies  Allergen Reactions  . Aspirin Other (See Comments)    "pt. has a history of ulcers" Stomach hurts  . Hydrochlorothiazide Itching     Past Medical History:  Diagnosis Date  . Anemia   . Anxiety   . Arthritis   . CKD (chronic kidney disease), stage IV ([place])   . Coronary artery disease    a. 03/2014 NSTEMI/Cath: LM nl, LAD 35m, 60d, D1 60, D2 nl, d3 40, LCX 100 w/ L->L collats, RCA non dom, min irregs, EF 55%-->Med Rx; b. 09/2019 MV: EF 30-44%, large basal inf, basal inflat, mid inf, mid inflat, apical defect - partially reversible.  . DDD (degenerative disc disease)    a. s/p upper back surgery spring 2015.  .  Diabetes mellitus without complication ([place])   . Diverticulosis   . Fibromyalgia   . GERD (gastroesophageal reflux disease)   . Hiatal hernia   . Hyperlipidemia   . Hypertensive heart disease    a. 03/2015 Echo: 55-60%, mild conc LVH; b. 10/2019 Echo: EF 65-70%, mod LVH.  . Mitral stenosis    a. 10/2019 Echo: EF 65-70%, mod LVH. No rwma. Mildly dil LA. MV Ca2+ w/ mod-sev MS (mean grad 16mmHg, Valve area 1.1-1.2). Mild to mod AoV sclerosis w/o stenosis.  . Syncope and collapse    a. Early 03/2014->did not seek medical attn.  . Tobacco abuse     Review of systems:  Otherwise negative.    Physical Exam  Gen: Alert, oriented. Appears stated age.  HEENT: [place]/AT. PERRLA. Lungs: CTA, no wheezes. CV: RR nl S1, S2. Abd: soft, benign, no masses. BS+ Ext: No edema. Pulses 2+    Planned procedures: Proceed with colonoscopy. The patient understands the nature of the planned procedure, indications, risks, alternatives and potential complications including but not limited to bleeding, infection, perforation, damage to internal organs and possible oversedation/side effects from anesthesia. The patient agrees and gives consent to proceed.  Please refer to procedure notes for findings, recommendations and patient disposition/instructions.     Destiny Hagin K. Alice Reichert, M.D. Gastroenterology 04/06/2020  12:19 PM

## 2020-04-06 NOTE — Anesthesia Postprocedure Evaluation (Signed)
Anesthesia Post Note  Patient: RANI IDLER  Procedure(s) Performed: COLONOSCOPY WITH PROPOFOL (N/A )  Patient location during evaluation: Endoscopy Anesthesia Type: General Level of consciousness: awake and alert and oriented Pain management: pain level controlled Vital Signs Assessment: post-procedure vital signs reviewed and stable Respiratory status: spontaneous breathing, nonlabored ventilation and respiratory function stable Cardiovascular status: blood pressure returned to baseline and stable Postop Assessment: no signs of nausea or vomiting Anesthetic complications: no     Last Vitals:  Vitals:   04/06/20 1343 04/06/20 1352  BP: (!) 147/80 (!) 152/75  Pulse: 73   Resp: 14   Temp:    SpO2: 99%     Last Pain:  Vitals:   04/06/20 1352  TempSrc:   PainSc: 0-No pain                 Chett Taniguchi

## 2020-04-06 NOTE — Anesthesia Preprocedure Evaluation (Signed)
Anesthesia Evaluation  Patient identified by MRN, date of birth, ID band Patient awake    Reviewed: Allergy & Precautions, NPO status , Patient's Chart, lab work & pertinent test results  History of Anesthesia Complications Negative for: history of anesthetic complications  Airway Mallampati: III  TM Distance: >3 FB Neck ROM: Full    Dental  (+) Poor Dentition, Missing   Pulmonary sleep apnea and Continuous Positive Airway Pressure Ventilation , neg COPD, Current Smoker,    breath sounds clear to auscultation- rhonchi (-) wheezing      Cardiovascular hypertension, Pt. on medications + CAD, + Past MI and + Cardiac Stents  (-) CABG + dysrhythmias  Rhythm:Regular Rate:Normal - Systolic murmurs and - Diastolic murmurs    Neuro/Psych neg Seizures Anxiety negative neurological ROS     GI/Hepatic Neg liver ROS, hiatal hernia, GERD  ,  Endo/Other  diabetes, Insulin Dependent  Renal/GU CRFRenal disease     Musculoskeletal  (+) Arthritis , Fibromyalgia -  Abdominal (+) + obese,   Peds  Hematology  (+) anemia ,   Anesthesia Other Findings Past Medical History: No date: Anemia No date: Anxiety No date: Arthritis No date: CKD (chronic kidney disease), stage IV ([facility]) No date: Coronary artery disease     Comment:  a. 03/2014 NSTEMI/Cath: LM nl, LAD 61m, 60d, D1 60, D2               nl, d3 40, LCX 100 w/ L->L collats, RCA non dom, min               irregs, EF 55%-->Med Rx; b. 09/2019 MV: EF 30-44%, large               basal inf, basal inflat, mid inf, mid inflat, apical               defect - partially reversible. No date: DDD (degenerative disc disease)     Comment:  a. s/p upper back surgery spring 2015. No date: Diabetes mellitus without complication ([facility]) No date: Diverticulosis No date: Dysrhythmia No date: Fibromyalgia No date: GERD (gastroesophageal reflux disease) No date: Hiatal hernia No date:  Hyperlipidemia No date: Hypertension No date: Hypertensive heart disease     Comment:  a. 03/2015 Echo: 55-60%, mild conc LVH; b. 10/2019 Echo:               EF 65-70%, mod LVH. No date: Mitral stenosis     Comment:  a. 10/2019 Echo: EF 65-70%, mod LVH. No rwma. Mildly dil              LA. MV Ca2+ w/ mod-sev MS (mean grad 35mmHg, Valve area               1.1-1.2). Mild to mod AoV sclerosis w/o stenosis. No date: Myocardial infarction ([place]) No date: Sleep apnea No date: Syncope and collapse     Comment:  a. Early 03/2014->did not seek medical attn. No date: Tobacco abuse   Reproductive/Obstetrics                             Anesthesia Physical Anesthesia Plan  ASA: III  Anesthesia Plan: General   Post-op Pain Management:    Induction: Intravenous  PONV Risk Score and Plan: 1 and Propofol infusion  Airway Management Planned: Natural Airway  Additional Equipment:   Intra-op Plan:   Post-operative Plan:   Informed Consent: I have reviewed the patients History  and Physical, chart, labs and discussed the procedure including the risks, benefits and alternatives for the proposed anesthesia with the patient or authorized representative who has indicated his/her understanding and acceptance.     Dental advisory given  Plan Discussed with: CRNA and Anesthesiologist  Anesthesia Plan Comments:         Anesthesia Quick Evaluation

## 2020-04-06 NOTE — Op Note (Signed)
[facility] Patient Name: Caitlin Bass Procedure Date: 04/06/2020 12:33 PM MRN: [id number] Account #: [id number] Date of Birth: 1951/10/10 Admit Type: Outpatient Age: 69 Room: [facility] ENDO ROOM 3 Gender: Female Note Status: Finalized Procedure:             Colonoscopy Indications:           Hematochezia Providers:             Benay Pike. Alice Reichert MD, MD Referring MD:          Marguerita Merles, MD (Referring MD) Medicines:             Propofol per Anesthesia Complications:         No immediate complications. Procedure:             Pre-Anesthesia Assessment:                        - The risks and benefits of the procedure and the                         sedation options and risks were discussed with the                         patient. All questions were answered and informed                         consent was obtained.                        - Patient identification and proposed procedure were                         verified prior to the procedure by the nurse. The                         procedure was verified in the procedure room.                        - ASA Grade Assessment: III - A patient with severe                         systemic disease.                        - After reviewing the risks and benefits, the patient                         was deemed in satisfactory condition to undergo the                         procedure.                        After obtaining informed consent, the colonoscope was                         passed under direct vision. Throughout the procedure,                         the patient's blood pressure, pulse, and oxygen  saturations were monitored continuously. The                         Colonoscope was introduced through the anus and                         advanced to the the cecum, identified by appendiceal                         orifice and ileocecal valve. The colonoscopy was                    somewhat difficult due to the patient's position                         intolerance. Successful completion of the procedure                         was aided by performing chin lift. The patient                         tolerated the procedure well. The quality of the bowel                         preparation was adequate. The ileocecal valve,                         appendiceal orifice, and rectum were photographed. Findings:      The perianal and digital rectal examinations were normal. Pertinent       negatives include normal sphincter tone and no palpable rectal lesions.      Non-bleeding internal hemorrhoids were found during retroflexion. The       hemorrhoids were Grade I (internal hemorrhoids that do not prolapse).      Many small and large-mouthed diverticula were found in the entire colon.      A 11 mm polyp was found in the mid transverse colon. The polyp was       sessile. The polyp was removed with a hot snare. Resection and retrieval       were complete. To prevent bleeding after the polypectomy, one hemostatic       clip was successfully placed (MR conditional). There was no bleeding       during, or at the end, of the procedure.      A 5 mm polyp was found in the transverse colon. The polyp was sessile.       The polyp was removed with a jumbo cold forceps. Resection and retrieval       were complete.      The exam was otherwise without abnormality. Impression:            - Non-bleeding internal hemorrhoids.                        - Diverticulosis in the entire examined colon.                        - One 11 mm polyp in the mid transverse colon, removed                         with  a hot snare. Resected and retrieved. Clip (MR                         conditional) was placed.                        - One 5 mm polyp in the transverse colon, removed with                         a jumbo cold forceps. Resected and retrieved.                        - The  examination was otherwise normal. Recommendation:        - Patient has a contact number available for                         emergencies. The signs and symptoms of potential                         delayed complications were discussed with the patient.                         Return to normal activities tomorrow. Written                         discharge instructions were provided to the patient.                        - Resume previous diet.                        - Continue present medications.                        - Await pathology results.                        - Repeat colonoscopy is recommended for surveillance.                         The colonoscopy date will be determined after                         pathology results from today's exam become available                         for review.                        - High fiber diet.                        - Return to nurse practitioner in 6 weeks.                        - Follow up with Stephens November, GI Nurse                         Practioner, in office to discuss results and monitor  progress.                        - The findings and recommendations were discussed with                         the patient. Procedure Code(s):     --- Professional ---                        [phone], Colonoscopy, flexible; with removal of                         tumor(s), polyp(s), or other lesion(s) by snare                         technique                        45380, 51, Colonoscopy, flexible; with biopsy, single                         or multiple Diagnosis Code(s):     --- Professional ---                        K57.30, Diverticulosis of large intestine without                         perforation or abscess without bleeding                        K92.1, Melena (includes Hematochezia)                        K63.5, Polyp of colon                        K64.0, First degree hemorrhoids CPT copyright 2019 American  Medical Association. All rights reserved. The codes documented in this report are preliminary and upon coder review may  be revised to meet current compliance requirements. Efrain Sella MD, MD 04/06/2020 1:24:15 PM This report has been signed electronically. Number of Addenda: 0 Note Initiated On: 04/06/2020 12:33 PM Scope Withdrawal Time: 0 hours 15 minutes 52 seconds  Total Procedure Duration: 0 hours 18 minutes 56 seconds  Estimated Blood Loss:  Estimated blood loss: none.      [facility]

## 2020-04-06 NOTE — Interval H&P Note (Signed)
History and Physical Interval Note:  04/06/2020 12:22 PM  Caitlin Bass  has presented today for surgery, with the diagnosis of HEMATOCHEZIA.  The various methods of treatment have been discussed with the patient and family. After consideration of risks, benefits and other options for treatment, the patient has consented to  Procedure(s): COLONOSCOPY WITH PROPOFOL (N/A) as a surgical intervention.  The patient's history has been reviewed, patient examined, no change in status, stable for surgery.  I have reviewed the patient's chart and labs.  Questions were answered to the patient's satisfaction.     [place], [place]

## 2020-04-06 NOTE — Transfer of Care (Signed)
Immediate Anesthesia Transfer of Care Note  Patient: Caitlin Bass  Procedure(s) Performed: COLONOSCOPY WITH PROPOFOL (N/A )  Patient Location: Endoscopy Unit  Anesthesia Type:General  Level of Consciousness: drowsy  Airway & Oxygen Therapy: Patient Spontanous Breathing  Post-op Assessment: Report given to RN and Post -op Vital signs reviewed and stable  Post vital signs: Reviewed and stable  Last Vitals:  Vitals Value Taken Time  BP    Temp    Pulse 81 04/06/20 1325  Resp 18 04/06/20 1325  SpO2 97 % 04/06/20 1325  Vitals shown include unvalidated device data.  Last Pain:  Vitals:   04/06/20 1234  TempSrc: Tympanic         Complications: No apparent anesthesia complications

## 2020-04-07 ENCOUNTER — Encounter: Payer: Self-pay | Admitting: *Deleted

## 2020-04-07 LAB — SURGICAL PATHOLOGY

## 2020-04-13 ENCOUNTER — Encounter (INDEPENDENT_AMBULATORY_CARE_PROVIDER_SITE_OTHER): Payer: Self-pay | Admitting: Vascular Surgery

## 2020-04-13 ENCOUNTER — Other Ambulatory Visit (INDEPENDENT_AMBULATORY_CARE_PROVIDER_SITE_OTHER): Payer: Self-pay

## 2020-04-13 ENCOUNTER — Encounter (INDEPENDENT_AMBULATORY_CARE_PROVIDER_SITE_OTHER): Payer: Self-pay

## 2020-04-14 ENCOUNTER — Ambulatory Visit: Payer: Medicare Other | Admitting: Cardiovascular Disease

## 2020-04-14 ENCOUNTER — Telehealth: Payer: Self-pay

## 2020-04-14 DIAGNOSIS — R918 Other nonspecific abnormal finding of lung field: Secondary | ICD-10-CM

## 2020-04-14 DIAGNOSIS — Z87891 Personal history of nicotine dependence: Secondary | ICD-10-CM

## 2020-04-14 NOTE — Telephone Encounter (Signed)
Patient has been notified that the low dose lung cancer screening CT scan is due currently or will be in near future.  Confirmed that patient is within the appropriate age range and asymptomatic, (no signs or symptoms of lung cancer).  Patient denies illness that would prevent curative treatment for lung cancer if found.  Patient is agreeable for CT scan being scheduled.    Verified smoking history (current smoker, with 41 year 0.5 ppd history).    CT scan scheduled for 05/07/20 @ 11:00

## 2020-04-15 NOTE — Telephone Encounter (Signed)
Smoking history:  Current, 31.25 pack year

## 2020-04-15 NOTE — Addendum Note (Signed)
Addended by: Lieutenant Diego on: 04/15/2020 01:39 PM   Modules accepted: Orders

## 2020-04-16 ENCOUNTER — Emergency Department: Payer: Medicare Other

## 2020-04-16 ENCOUNTER — Emergency Department
Admission: EM | Admit: 2020-04-16 | Discharge: 2020-04-17 | Disposition: A | Discharge status: Home / Self Care | Payer: Medicare Other | Attending: Emergency Medicine | Admitting: Emergency Medicine

## 2020-04-16 ENCOUNTER — Encounter: Payer: Self-pay | Admitting: Psychiatry

## 2020-04-16 ENCOUNTER — Other Ambulatory Visit: Payer: Self-pay

## 2020-04-16 DIAGNOSIS — I129 Hypertensive chronic kidney disease with stage 1 through stage 4 chronic kidney disease, or unspecified chronic kidney disease: Secondary | ICD-10-CM | POA: Diagnosis not present

## 2020-04-16 DIAGNOSIS — N184 Chronic kidney disease, stage 4 (severe): Secondary | ICD-10-CM | POA: Insufficient documentation

## 2020-04-16 DIAGNOSIS — Z794 Long term (current) use of insulin: Secondary | ICD-10-CM | POA: Diagnosis not present

## 2020-04-16 DIAGNOSIS — I2510 Atherosclerotic heart disease of native coronary artery without angina pectoris: Secondary | ICD-10-CM | POA: Insufficient documentation

## 2020-04-16 DIAGNOSIS — E1122 Type 2 diabetes mellitus with diabetic chronic kidney disease: Secondary | ICD-10-CM | POA: Insufficient documentation

## 2020-04-16 DIAGNOSIS — R0789 Other chest pain: Secondary | ICD-10-CM | POA: Diagnosis not present

## 2020-04-16 DIAGNOSIS — Z79899 Other long term (current) drug therapy: Secondary | ICD-10-CM | POA: Insufficient documentation

## 2020-04-16 DIAGNOSIS — I252 Old myocardial infarction: Secondary | ICD-10-CM | POA: Insufficient documentation

## 2020-04-16 DIAGNOSIS — F17210 Nicotine dependence, cigarettes, uncomplicated: Secondary | ICD-10-CM | POA: Insufficient documentation

## 2020-04-16 DIAGNOSIS — E875 Hyperkalemia: Secondary | ICD-10-CM | POA: Diagnosis not present

## 2020-04-16 LAB — BASIC METABOLIC PANEL
Anion gap: 11 (ref 5–15)
BUN: 37 mg/dL — ABNORMAL HIGH (ref 8–23)
CO2: 21 mmol/L — ABNORMAL LOW (ref 22–32)
Calcium: 8.2 mg/dL — ABNORMAL LOW (ref 8.9–10.3)
Chloride: 108 mmol/L (ref 98–111)
Creatinine, Ser: 3.74 mg/dL — ABNORMAL HIGH (ref 0.44–1.00)
GFR calc Af Amer: 14 mL/min — ABNORMAL LOW (ref >60)
GFR calc non Af Amer: 12 mL/min — ABNORMAL LOW (ref >60)
Glucose, Bld: 138 mg/dL — ABNORMAL HIGH (ref 70–99)
Potassium: 5.9 mmol/L — ABNORMAL HIGH (ref 3.5–5.1)
Sodium: 140 mmol/L (ref 135–145)

## 2020-04-16 LAB — TROPONIN I (HIGH SENSITIVITY)
Troponin I (High Sensitivity): 13 ng/L (ref <18)
Troponin I (High Sensitivity): 15 ng/L (ref <18)

## 2020-04-16 LAB — CBC
HCT: 29.6 % — ABNORMAL LOW (ref 36.0–46.0)
Hemoglobin: 9.4 g/dL — ABNORMAL LOW (ref 12.0–15.0)
MCH: 28.5 pg (ref 26.0–34.0)
MCHC: 31.8 g/dL (ref 30.0–36.0)
MCV: 89.7 fL (ref 80.0–100.0)
Platelets: 299 10*3/uL (ref 150–400)
RBC: 3.3 MIL/uL — ABNORMAL LOW (ref 3.87–5.11)
RDW: 16.4 % — ABNORMAL HIGH (ref 11.5–15.5)
WBC: 6.3 10*3/uL (ref 4.0–10.5)
nRBC: 0 % (ref 0.0–0.2)

## 2020-04-16 MED ORDER — SODIUM CHLORIDE 0.9% FLUSH: 3.0000 mL | Freq: Once | INTRAVENOUS | Status: DC

## 2020-04-16 NOTE — ED Notes (Signed)
Pt updated on wait, VS reassessed 

## 2020-04-16 NOTE — ED Triage Notes (Addendum)
Patient arrived POV due to chest pain pressure. Patient says it started yesterday, went outside and had a hard time catching her breathe, and still feeling this way, called PCP and they told her to come to ER. Diagnosed with kidney disease this month 03/2020

## 2020-04-17 DIAGNOSIS — R0789 Other chest pain: Secondary | ICD-10-CM | POA: Diagnosis not present

## 2020-04-17 MED ORDER — SODIUM ZIRCONIUM CYCLOSILICATE 10 G PO PACK
10.0000 g | PACK | ORAL | Status: AC
  Administered 2020-04-17: 10 g via ORAL
  Filled 2020-04-17: qty 1

## 2020-04-17 NOTE — Discharge Instructions (Addendum)
Your workup in the Emergency Department today was reassuring.  We did not find any specific cause of your symptoms.  Most of your lab work was normal for you, although your potassium level was a bit too high tonight.  Your EKG was generally reassuring and we gave you a dose of medicine (Lokelma) to help eliminate potassium from your system, but I strongly encourage you to call your kidney doctor in the morning and ask them for the next available appointment or to see if you can have some additional blood work drawn later today to make sure your potassium level is returning to normal.  He can also follow-up today with your regular doctor and/or your cardiologist.  We recommend you drink plenty of fluids, take your regular medications and/or any new ones prescribed today, and follow up with the doctor(s) listed in these documents as recommended.  Return to the Emergency Department if you develop new or worsening symptoms that concern you.

## 2020-04-17 NOTE — ED Provider Notes (Signed)
[facility] Emergency Department Provider Note  ____________________________________________   First MD Initiated Contact with Patient 04/17/20 0008     (approximate)  I have reviewed the triage vital signs and the nursing notes.   HISTORY  Chief Complaint Chest Pain    HPI Caitlin Bass is a 70 y.o. female with medical history as listed below who presents for evaluation of an episode of pressure in her chest.  This happened nearly 12 hours ago in the afternoon while she was walking outside.  She said that it did not feel exactly like pain but feels "like if you are and a cold room but walk into somewhere that is hot.  She feels like it was hard to catch her breath.  She said that the symptoms resolved hours ago when she has been waiting in the emergency department for nearly 8 hours.  She called her primary care doctor in the afternoon and they recommended she come to the ED.  She has had no chest pain, just the episode of pressure and difficulty catching her breath.  She has no history of blood clots in her legs or lungs.  She denies fever, sore throat, nausea, vomiting, abdominal pain, and dysuria.  Nothing particular makes her symptoms better or worse.  Her cardiologist is Dr. Fletcher Anon and she had appointment last month.         Past Medical History:  Diagnosis Date  . Anemia   . Anxiety   . Arthritis   . CKD (chronic kidney disease), stage IV ([place])   . Coronary artery disease    a. 03/2014 NSTEMI/Cath: LM nl, LAD 90m, 60d, D1 60, D2 nl, d3 40, LCX 100 w/ L->L collats, RCA non dom, min irregs, EF 55%-->Med Rx; b. 09/2019 MV: EF 30-44%, large basal inf, basal inflat, mid inf, mid inflat, apical defect - partially reversible.  . DDD (degenerative disc disease)    a. s/p upper back surgery spring 2015.  . Diabetes mellitus without complication ([place])   . Diverticulosis   . Dysrhythmia   . Fibromyalgia   . GERD (gastroesophageal reflux disease)   .  Hiatal hernia   . Hyperlipidemia   . Hypertension   . Hypertensive heart disease    a. 03/2015 Echo: 55-60%, mild conc LVH; b. 10/2019 Echo: EF 65-70%, mod LVH.  . Mitral stenosis    a. 10/2019 Echo: EF 65-70%, mod LVH. No rwma. Mildly dil LA. MV Ca2+ w/ mod-sev MS (mean grad 57mmHg, Valve area 1.1-1.2). Mild to mod AoV sclerosis w/o stenosis.  . Myocardial infarction ([place])   . Sleep apnea   . Syncope and collapse    a. Early 03/2014->did not seek medical attn.  . Tobacco abuse     Patient Active Problem List   Diagnosis Date Noted  . Mitral valve stenosis   . Anemia due to stage 4 chronic kidney disease ([place]) 02/22/2017  . Other iron deficiency anemia 02/26/2016  . Hypertensive heart disease   . Tobacco abuse   . Hyperlipidemia   . Coronary artery disease   . Diabetes mellitus without complication ([place])   . Hypertension   . CKD (chronic kidney disease), stage IV ([place])   . GERD (gastroesophageal reflux disease)   . DDD (degenerative disc disease)     Past Surgical History:  Procedure Laterality Date  . BREAST CYST ASPIRATION Right 2005   NEG  . CARDIAC CATHETERIZATION  06/2005   [facility]  . CARDIAC CATHETERIZATION  04/2014  [facility]  . COLONOSCOPY WITH PROPOFOL N/A 04/27/2016   Procedure: COLONOSCOPY WITH PROPOFOL;  Surgeon: Manya Silvas, MD;  Location: [facility] ENDOSCOPY;  Service: Endoscopy;  Laterality: N/A;  . COLONOSCOPY WITH PROPOFOL N/A 04/06/2020   Procedure: COLONOSCOPY WITH PROPOFOL;  Surgeon: Toledo, Benay Pike, MD;  Location: [facility] ENDOSCOPY;  Service: Gastroenterology;  Laterality: N/A;  . CORONARY ANGIOPLASTY    . DILATATION & CURETTAGE/HYSTEROSCOPY WITH MYOSURE N/A 09/18/2015   Procedure: DILATATION & CURETTAGE/HYSTEROSCOPY WITH MYOSURE;  Surgeon: Boykin Nearing, MD;  Location: [facility] ORS;  Service: Gynecology;  Laterality: N/A;  . ESOPHAGOGASTRODUODENOSCOPY (EGD) WITH PROPOFOL N/A 04/27/2016   Procedure: ESOPHAGOGASTRODUODENOSCOPY (EGD) WITH PROPOFOL;  Surgeon:  Manya Silvas, MD;  Location: [facility] ENDOSCOPY;  Service: Endoscopy;  Laterality: N/A;  . NECK SURGERY     Degenerative Disk Disease and removal of a spinal cyst  . TEE WITHOUT CARDIOVERSION N/A 12/30/2019   Procedure: TRANSESOPHAGEAL ECHOCARDIOGRAM (TEE);  Surgeon: Wellington Hampshire, MD;  Location: [facility] ORS;  Service: Cardiovascular;  Laterality: N/A;  . TUBAL LIGATION      Prior to Admission medications   Medication Sig Start Date End Date Taking? Authorizing Provider  allopurinol (ZYLOPRIM) 100 MG tablet Take 100 mg by mouth 2 (two) times daily.    [provider]  amLODipine (NORVASC) 10 MG tablet Take 1 tablet (10 mg total) by mouth daily. 01/10/20 07/08/20  Loel Dubonnet, NP  Calcium Carbonate-Vitamin D (CALCIUM 600+D) 600-400 MG-UNIT tablet Take 1 tablet by mouth daily.    [provider]  carvedilol (COREG) 12.5 MG tablet Take 1 tablet (12.5 mg total) by mouth 2 (two) times daily. 11/19/19   Theora Gianotti, NP  cetirizine (ZYRTEC) 10 MG tablet Take 10 mg by mouth at bedtime.     [provider]  cholecalciferol (VITAMIN D) 25 MCG (1000 UT) tablet Take 1,000 Units by mouth daily.    [provider]  colchicine 0.6 MG tablet Take 0.6 mg by mouth daily as needed (gout flare).  08/14/15   [provider]  ferrous sulfate 325 (65 FE) MG tablet Take 325 mg by mouth every other day.    [provider]  fluticasone (FLONASE) 50 MCG/ACT nasal spray Place 1 spray into both nostrils 2 (two) times daily as needed.     [provider]  fluticasone (VERAMYST) 27.5 MCG/SPRAY nasal spray Place 2 sprays into the nose daily. Patient not taking: Reported on 03/20/2020 01/28/20   Sherrie George B, FNP  GLUCOSAMINE HCL PO Take 1 tablet by mouth daily.    [provider]  HUMALOG KWIKPEN 100 UNIT/ML KwikPen Inject 8-28 Units into the skin See admin instructions. Inject 8 units subcutaneously in the morning, 28 units with lunch &  28 units with supper 11/13/19   [provider]  LANTUS SOLOSTAR 100 UNIT/ML Solostar Pen Inject 40-44 Units into the skin See admin instructions. Inject 40 units subcutaneously in the morning & inject 44 units subcutaneously at night. 11/13/19   [provider]  linaclotide (LINZESS) 72 MCG capsule Take 72 mcg by mouth every other day.  01/03/17 11/18/28  [provider]  magnesium oxide (MAG-OX) 400 MG tablet Take 400 mg by mouth 3 (three) times daily.     [provider]  Multiple Vitamin (MULTIVITAMIN WITH MINERALS) TABS tablet Take 1 tablet by mouth daily.    [provider]  Olopatadine HCl (PATADAY) 0.2 % SOLN Place 1 drop into both eyes 2 (two) times daily as needed (  allergy eyes.).    [provider]  Omega-3 Fatty Acids (FISH OIL MAXIMUM STRENGTH) 1200 MG CAPS Take 1,000 mg by mouth 3 (three) times daily.  02/10/16   [provider]  pantoprazole (PROTONIX) 20 MG tablet Take 20 mg by mouth 2 (two) times daily.  07/25/16   [provider]  patiromer (VELTASSA) 8.4 g packet Take 8.4 g by mouth daily.    [provider]  rosuvastatin (CRESTOR) 40 MG tablet Take 1 tablet (40 mg total) by mouth daily. 02/13/20 05/13/20  Wellington Hampshire, MD  sodium bicarbonate 650 MG tablet Take 650 mg by mouth 3 (three) times daily.    [provider]  sodium polystyrene (KAYEXALATE) 15 GM/60ML suspension Take 60 g by mouth as needed.     [provider]  SYMBICORT 160-4.5 MCG/ACT inhaler Inhale 2 puffs into the lungs 2 (two) times daily. 11/13/19   [provider]  tiZANidine (ZANAFLEX) 4 MG tablet Take 4 mg by mouth 3 (three) times daily as needed for muscle spasms. 08/22/19   [provider]  triamcinolone cream (KENALOG) 0.1 % Apply 1 application topically 2 (two) times daily as needed (skin rash/irritation).     [provider]    Allergies Aspirin and Hydrochlorothiazide  Family  History  Problem Relation Age of Onset  . Heart disease Mother   . Heart attack Mother   . Hypertension Mother   . Hyperlipidemia Mother   . Breast cancer Sister     Social History Social History   Tobacco Use  . Smoking status: Current Every Day Smoker    Packs/day: 0.25    Years: 40.00    Pack years: 10.00    Types: Cigarettes  . Smokeless tobacco: Never Used  Substance Use Topics  . Alcohol use: No  . Drug use: No    Review of Systems Constitutional: No fever/chills Eyes: No visual changes. ENT: No sore throat. Cardiovascular: Transient chest pressure. Respiratory: Brief episode of shortness of breath associated with chest pressure. Gastrointestinal: No abdominal pain.  No nausea, no vomiting.  No diarrhea.  No constipation. Genitourinary: Negative for dysuria. Musculoskeletal: Negative for neck pain.  Negative for back pain. Integumentary: Negative for rash. Neurological: Negative for headaches, focal weakness or numbness.   ____________________________________________   PHYSICAL EXAM:  VITAL SIGNS: ED Triage Vitals  Enc Vitals Group     BP 04/16/20 1705 (!) 146/57     Pulse Rate 04/16/20 1705 74     Resp 04/16/20 1705 18     Temp 04/16/20 1705 98.2 F (36.8 C)     Temp Source 04/16/20 1705 Oral     SpO2 04/16/20 1705 97 %     Weight 04/16/20 1710 74.8 kg (165 lb)     Height 04/16/20 1710 1.6 m (5\' 3" )     Head Circumference --      Peak Flow --      Pain Score 04/16/20 1725 4     Pain Loc --      Pain Edu? --      Excl. in [place]? --     Constitutional: Alert and oriented.  Eyes: Conjunctivae are normal.  Head: Atraumatic. Nose: No congestion/rhinnorhea. Mouth/Throat: Patient is wearing a mask. Neck: No stridor.  No meningeal signs.   Cardiovascular: Normal rate, regular rhythm. Good peripheral circulation. Grossly normal heart sounds. Respiratory: Normal respiratory effort.  No retractions. Gastrointestinal: Soft and nontender. No distention.    Musculoskeletal: No lower extremity tenderness nor  edema. No gross deformities of extremities. Neurologic:  Normal speech and language. No gross focal neurologic deficits are appreciated.  Skin:  Skin is warm, dry and intact. Psychiatric: Mood and affect are normal. Speech and behavior are normal.  ____________________________________________   LABS (all labs ordered are listed, but only abnormal results are displayed)  Labs Reviewed  BASIC METABOLIC PANEL - Abnormal; Notable for the following components:      Result Value   Potassium 5.9 (*)    CO2 21 (*)    Glucose, Bld 138 (*)    BUN 37 (*)    Creatinine, Ser 3.74 (*)    Calcium 8.2 (*)    GFR calc non Af Amer 12 (*)    GFR calc Af Amer 14 (*)    All other components within normal limits  CBC - Abnormal; Notable for the following components:   RBC 3.30 (*)    Hemoglobin 9.4 (*)    HCT 29.6 (*)    RDW 16.4 (*)    All other components within normal limits  TROPONIN I (HIGH SENSITIVITY)  TROPONIN I (HIGH SENSITIVITY)   ____________________________________________  EKG  ED ECG REPORT I, Hinda Kehr, the attending physician, personally viewed and interpreted this ECG.  Date: 04/16/2020 EKG Time: 17: 07 Rate: 76 Rhythm: normal sinus rhythm QRS Axis: Left axis deviation Intervals: normal ST/T Wave abnormalities: Inverted T waves in lead I and aVL, otherwise unremarkable. Narrative Interpretation: no definitive evidence of acute ischemia; does not meet STEMI criteria.  T wave inversions are more pronounced than prior EKG but similar in morphology.   ____________________________________________  RADIOLOGY Ursula Alert, personally viewed and evaluated these images (plain radiographs) as part of my medical decision making, as well as reviewing the written report by the radiologist.  ED MD interpretation: No acute abnormality identified on chest x-ray  Official radiology report(s): DG Chest 2 View  Result Date:  04/16/2020 CLINICAL DATA:  Chest pain radiated to the right-side EXAM: CHEST - 2 VIEW COMPARISON:  None. FINDINGS: The cardiac silhouette is moderately enlarged. Both lungs are clear. Multilevel degenerative changes seen throughout the mid and lower thoracic spine. IMPRESSION: No active cardiopulmonary disease. Electronically Signed   By: Virgina Norfolk M.D.   On: 04/16/2020 18:10    ____________________________________________   PROCEDURES   Procedure(s) performed (including Critical Care):  Procedures   ____________________________________________   INITIAL IMPRESSION / MDM / [place] / ED COURSE  As part of my medical decision making, I reviewed the following data within the [place] notes reviewed and incorporated, Labs reviewed , EKG interpreted , Old EKG reviewed, Radiograph reviewed  and Notes from prior ED visits   Differential diagnosis includes, but is not limited to, nonspecific viral infection, ACS, PE, pneumonia.  The patient is well-appearing and in no distress and is currently asymptomatic.  She has been in the emergency department nearly 8 hours and says she feels fine.  EKG is not significantly changed from prior.  The patient's chronic kidney disease is at her baseline with creatinine around 3.7 and 2 high-sensitivity troponins have been essentially unchanged at 13 and 15.  Her potassium tonight is 5.9 but looking back over her record both in [facility] and in care everywhere specifically [facility], her potassium has been extremely variable ranging from up to as high as 6.8 although that value was thought to be a lab error.  The episode she is describing tonight of almost a chill or feeling hot  when otherwise chilled does not sound consistent with either an arrhythmia or ACS.  Her chronic kidney disease is stable as previously described.  She does not need to stay in the hospital for her potassium of 5.9 and I will treat with a dose of Lokelma  10 g by mouth.  I am encouraging her to follow-up with her nephrologist and her primary care doctor later today for repeat lab work to see if she needs additional treatment but at this time she is well-appearing and in no distress with no evidence of an emergent medical condition.  She is comfortable with this plan and is already established with a cardiologist as well with whom she can schedule a follow-up appointment.  I gave my usual and customary return precautions.          ____________________________________________  FINAL CLINICAL IMPRESSION(S) / ED DIAGNOSES  Final diagnoses:  Atypical chest pain  Hyperkalemia     MEDICATIONS GIVEN DURING THIS VISIT:  Medications  sodium zirconium cyclosilicate (LOKELMA) packet 10 g (has no administration in time range)     ED Discharge Orders    None      *Please note:  Caitlin Bass was evaluated in Emergency Department on 04/17/2020 for the symptoms described in the history of present illness. She was evaluated in the context of the global COVID-19 pandemic, which necessitated consideration that the patient might be at risk for infection with the SARS-CoV-2 virus that causes COVID-19. Institutional protocols and algorithms that pertain to the evaluation of patients at risk for COVID-19 are in a state of rapid change based on information released by regulatory bodies including the CDC and federal and state organizations. These policies and algorithms were followed during the patient's care in the ED.  Some ED evaluations and interventions may be delayed as a result of limited staffing during the pandemic.*  Note:  This document was prepared using Dragon voice recognition software and may include unintentional dictation errors.   Hinda Kehr, MD 04/17/20 [id number]

## 2020-04-20 ENCOUNTER — Inpatient Hospital Stay: Payer: Medicare Other | Attending: Internal Medicine

## 2020-04-20 ENCOUNTER — Other Ambulatory Visit: Payer: Self-pay | Admitting: *Deleted

## 2020-04-20 ENCOUNTER — Inpatient Hospital Stay: Payer: Medicare Other

## 2020-04-20 ENCOUNTER — Other Ambulatory Visit: Payer: Self-pay

## 2020-04-20 DIAGNOSIS — D631 Anemia in chronic kidney disease: Secondary | ICD-10-CM | POA: Insufficient documentation

## 2020-04-20 DIAGNOSIS — N184 Chronic kidney disease, stage 4 (severe): Secondary | ICD-10-CM | POA: Diagnosis not present

## 2020-04-20 DIAGNOSIS — Z87891 Personal history of nicotine dependence: Secondary | ICD-10-CM

## 2020-04-20 LAB — BASIC METABOLIC PANEL
Anion gap: 11 (ref 5–15)
BUN: 42 mg/dL — ABNORMAL HIGH (ref 8–23)
CO2: 21 mmol/L — ABNORMAL LOW (ref 22–32)
Calcium: 8.4 mg/dL — ABNORMAL LOW (ref 8.9–10.3)
Chloride: 111 mmol/L (ref 98–111)
Creatinine, Ser: 4.34 mg/dL — ABNORMAL HIGH (ref 0.44–1.00)
GFR calc Af Amer: 11 mL/min — ABNORMAL LOW (ref >60)
GFR calc non Af Amer: 10 mL/min — ABNORMAL LOW (ref >60)
Glucose, Bld: 94 mg/dL (ref 70–99)
Potassium: 4.8 mmol/L (ref 3.5–5.1)
Sodium: 143 mmol/L (ref 135–145)

## 2020-04-20 LAB — CBC WITH DIFFERENTIAL/PLATELET
Abs Immature Granulocytes: 0.04 10*3/uL (ref 0.00–0.07)
Basophils Absolute: 0 10*3/uL (ref 0.0–0.1)
Basophils Relative: 1 %
Eosinophils Absolute: 0.1 10*3/uL (ref 0.0–0.5)
Eosinophils Relative: 2 %
HCT: 31.6 % — ABNORMAL LOW (ref 36.0–46.0)
Hemoglobin: 10 g/dL — ABNORMAL LOW (ref 12.0–15.0)
Immature Granulocytes: 1 %
Lymphocytes Relative: 31 %
Lymphs Abs: 2 10*3/uL (ref 0.7–4.0)
MCH: 28.2 pg (ref 26.0–34.0)
MCHC: 31.6 g/dL (ref 30.0–36.0)
MCV: 89.3 fL (ref 80.0–100.0)
Monocytes Absolute: 0.7 10*3/uL (ref 0.1–1.0)
Monocytes Relative: 11 %
Neutro Abs: 3.6 10*3/uL (ref 1.7–7.7)
Neutrophils Relative %: 54 %
Platelets: 264 10*3/uL (ref 150–400)
RBC: 3.54 MIL/uL — ABNORMAL LOW (ref 3.87–5.11)
RDW: 16 % — ABNORMAL HIGH (ref 11.5–15.5)
WBC: 6.4 10*3/uL (ref 4.0–10.5)
nRBC: 0 % (ref 0.0–0.2)

## 2020-05-04 ENCOUNTER — Other Ambulatory Visit (INDEPENDENT_AMBULATORY_CARE_PROVIDER_SITE_OTHER): Payer: Self-pay | Admitting: Vascular Surgery

## 2020-05-04 DIAGNOSIS — N189 Chronic kidney disease, unspecified: Secondary | ICD-10-CM

## 2020-05-04 DIAGNOSIS — N186 End stage renal disease: Secondary | ICD-10-CM

## 2020-05-07 ENCOUNTER — Ambulatory Visit (INDEPENDENT_AMBULATORY_CARE_PROVIDER_SITE_OTHER): Payer: Medicare Other | Admitting: Vascular Surgery

## 2020-05-07 ENCOUNTER — Ambulatory Visit (INDEPENDENT_AMBULATORY_CARE_PROVIDER_SITE_OTHER): Payer: Medicare Other

## 2020-05-07 ENCOUNTER — Other Ambulatory Visit: Payer: Self-pay

## 2020-05-07 ENCOUNTER — Telehealth: Payer: Self-pay | Admitting: *Deleted

## 2020-05-07 ENCOUNTER — Ambulatory Visit
Admission: RE | Admit: 2020-05-07 | Discharge: 2020-05-07 | Disposition: A | Discharge status: Home / Self Care | Payer: Medicare Other | Source: Ambulatory Visit | Attending: Oncology | Admitting: Oncology

## 2020-05-07 ENCOUNTER — Encounter (INDEPENDENT_AMBULATORY_CARE_PROVIDER_SITE_OTHER): Payer: Self-pay | Admitting: Vascular Surgery

## 2020-05-07 VITALS — BP 151/74 | HR 78 | Ht 63.0 in | Wt 152.0 lb

## 2020-05-07 DIAGNOSIS — N189 Chronic kidney disease, unspecified: Secondary | ICD-10-CM | POA: Diagnosis not present

## 2020-05-07 DIAGNOSIS — I25118 Atherosclerotic heart disease of native coronary artery with other forms of angina pectoris: Secondary | ICD-10-CM

## 2020-05-07 DIAGNOSIS — N950 Postmenopausal bleeding: Secondary | ICD-10-CM | POA: Insufficient documentation

## 2020-05-07 DIAGNOSIS — R918 Other nonspecific abnormal finding of lung field: Secondary | ICD-10-CM | POA: Insufficient documentation

## 2020-05-07 DIAGNOSIS — I214 Non-ST elevation (NSTEMI) myocardial infarction: Secondary | ICD-10-CM | POA: Insufficient documentation

## 2020-05-07 DIAGNOSIS — Z87891 Personal history of nicotine dependence: Secondary | ICD-10-CM | POA: Insufficient documentation

## 2020-05-07 DIAGNOSIS — N184 Chronic kidney disease, stage 4 (severe): Secondary | ICD-10-CM | POA: Diagnosis not present

## 2020-05-07 DIAGNOSIS — R079 Chest pain, unspecified: Secondary | ICD-10-CM | POA: Insufficient documentation

## 2020-05-07 DIAGNOSIS — I10 Essential (primary) hypertension: Secondary | ICD-10-CM

## 2020-05-07 DIAGNOSIS — E1140 Type 2 diabetes mellitus with diabetic neuropathy, unspecified: Secondary | ICD-10-CM | POA: Diagnosis not present

## 2020-05-07 NOTE — Telephone Encounter (Signed)
Called report  IMPRESSION: 1. Lung-RADS 2, benign appearance or behavior. Continue annual screening with low-dose chest CT without contrast in 12 months. 2. Moderate pericardial effusion, increased. 3. Stable bilateral adrenal adenomas. 4. Aortic Atherosclerosis (ICD10-I70.0) and Emphysema (ICD10-J43.9).  These results will be called to the ordering clinician or representative by the Radiologist Assistant, and communication documented in the PACS or [employer].   Electronically Signed   By: Ilona Sorrel M.D.   On: 05/07/2020 14:06

## 2020-05-08 ENCOUNTER — Telehealth (INDEPENDENT_AMBULATORY_CARE_PROVIDER_SITE_OTHER): Payer: Self-pay

## 2020-05-08 ENCOUNTER — Encounter: Payer: Self-pay | Admitting: *Deleted

## 2020-05-08 ENCOUNTER — Telehealth: Payer: Self-pay | Admitting: *Deleted

## 2020-05-08 NOTE — Telephone Encounter (Signed)
Spoke with the patient and she is now scheduled with Dr. Delana Meyer for a Right wrist fistula on 05/22/20. Patient will do a phone call pre-op on 05/19/20 between 8-1 pm and covid testing on 05-20-20 between 8-1 pm at the [facility]. Pre-surgical instructions were discussed and will be mailed to the patient.

## 2020-05-08 NOTE — Telephone Encounter (Signed)
Attempted to contact to review lung screening results. Message left for patient to return my call. Also message sent via mychart to notify patient of increase in pericardial effusion. This information has been sent to cardiologist office as well as PCP and contacted PCP office verbally to notify as well.

## 2020-05-08 NOTE — Telephone Encounter (Signed)
I attempted to contact the patient and a message was left for a return call. 

## 2020-05-11 ENCOUNTER — Telehealth: Payer: Self-pay | Admitting: Cardiovascular Disease

## 2020-05-11 ENCOUNTER — Encounter (INDEPENDENT_AMBULATORY_CARE_PROVIDER_SITE_OTHER): Payer: Self-pay

## 2020-05-11 DIAGNOSIS — I3139 Other pericardial effusion (noninflammatory): Secondary | ICD-10-CM

## 2020-05-11 NOTE — Telephone Encounter (Signed)
I spoke with the patient and advised her of Dr. Tyrell Antonio recommendations to obtain an echocardiogram to assess the pericardial effusion seen on her chest CT with oncology.  The patient is aware this needs to be done within the next 1-2 weeks. She is aware that one of of schedulers will reach out to her in the next day or so to try to arrange this.  She voices understanding and is agreeable.   She will need to follow in clinic after her echo is completed.

## 2020-05-11 NOTE — Telephone Encounter (Signed)
The patient has been scheduled for an echocardiogram on 05/14/20 at a follow up appointment on 6/21 with Laurann Montana, NP.

## 2020-05-11 NOTE — Telephone Encounter (Signed)
Caitlin Hampshire, MD  Lieutenant Diego, RN; Emily Filbert, RN [place] thank you.   Caitlin Bass,  Please schedule this patient for an echocardiogram to evaluate pericardial effusion. This should be done within 1 to 2 weeks and follow-up after.       Previous Messages   ----- Message -----  From: Lieutenant Diego, RN  Sent: 05/07/2020  2:41 PM EDT  To: Caitlin Hampshire, MD   Dr. Fletcher Anon, I will be notifying [name] and her PCP of these results. I want to make sure you saw her increased effusion.  Thanks,  shawn

## 2020-05-13 NOTE — Progress Notes (Signed)
MRN : [id number]  Caitlin Bass is a 69 y.o. (Oct 15, 1951) female who presents with chief complaint of  Chief Complaint  Patient presents with  . New Patient (Initial Visit)    Vein mapping  .  History of Present Illness:   The patient is seen for evaluation for dialysis access. The patient has chronic renal insufficiency stage V secondary to hypertension and diabetes. The patient's most recent creatinine clearance is less than 20. The patient volume status has not yet become an issue. Patient's blood pressures been relatively well controlled. There are mild uremic symptoms which appear to be relatively well tolerated at this time. The patient is right-handed.  The patient has been considering the various methods of dialysis and wishes to proceed with hemodialysis and therefore creation of AV access.  The patient denies amaurosis fugax or recent TIA symptoms. There are no recent neurological changes noted. The patient denies claudication symptoms or rest pain symptoms. The patient denies history of DVT, PE or superficial thrombophlebitis. The patient denies recent episodes of angina or shortness of breath.   Current Meds  Medication Sig  . allopurinol (ZYLOPRIM) 100 MG tablet Take 100 mg by mouth 2 (two) times daily.  Marland Kitchen amLODipine (NORVASC) 10 MG tablet Take 1 tablet (10 mg total) by mouth daily. (Patient taking differently: Take 10 mg by mouth at bedtime. )  . Calcium Carbonate-Vitamin D (CALCIUM 600+D) 600-400 MG-UNIT tablet Take 1 tablet by mouth daily.  . carvedilol (COREG) 12.5 MG tablet Take 1 tablet (12.5 mg total) by mouth 2 (two) times daily.  . cetirizine (ZYRTEC) 10 MG tablet Take 10 mg by mouth at bedtime.   . cholecalciferol (VITAMIN D) 25 MCG (1000 UT) tablet Take 1,000 Units by mouth daily.  . colchicine 0.6 MG tablet Take 0.6 mg by mouth daily as needed (gout flare).   . ferrous sulfate 325 (65 FE) MG tablet Take 325 mg by mouth every other day.  . fluticasone  (FLONASE) 50 MCG/ACT nasal spray Place 1 spray into both nostrils 2 (two) times daily as needed (nasal congestion.).   Marland Kitchen fluticasone (VERAMYST) 27.5 MCG/SPRAY nasal spray Place 2 sprays into the nose daily. (Patient not taking: Reported on 05/11/2020)  . GLUCOSAMINE HCL PO Take 1 tablet by mouth daily.  Marland Kitchen HUMALOG KWIKPEN 100 UNIT/ML KwikPen Inject 8-28 Units into the skin See admin instructions. Inject 8 units subcutaneously in the morning, 28 units with lunch & 28 units with supper  . LANTUS SOLOSTAR 100 UNIT/ML Solostar Pen Inject 40-44 Units into the skin See admin instructions. Inject 40 units subcutaneously in the morning & inject 44 units subcutaneously at night.  . linaclotide (LINZESS) 72 MCG capsule Take 72 mcg by mouth 4 (four) times a week.   . magnesium oxide (MAG-OX) 400 MG tablet Take 400 mg by mouth 3 (three) times daily.   . Olopatadine HCl (PATADAY) 0.2 % SOLN Place 1 drop into both eyes 2 (two) times daily as needed (allergy eyes.).  Marland Kitchen Omega-3 Fatty Acids (FISH OIL MAXIMUM STRENGTH) 1200 MG CAPS Take 1,000 mg by mouth 3 (three) times daily.   . pantoprazole (PROTONIX) 20 MG tablet Take 20 mg by mouth 2 (two) times daily.   . patiromer (VELTASSA) 8.4 g packet Take 4.2 g by mouth in the morning and at bedtime.   . rosuvastatin (CRESTOR) 40 MG tablet Take 1 tablet (40 mg total) by mouth daily. (Patient taking differently: Take 40 mg by mouth at bedtime. )  .  sodium bicarbonate 650 MG tablet Take 1,300 mg by mouth 2 (two) times daily.   . SYMBICORT 160-4.5 MCG/ACT inhaler Inhale 2 puffs into the lungs 2 (two) times daily.  Marland Kitchen tiZANidine (ZANAFLEX) 4 MG tablet Take 4 mg by mouth 3 (three) times daily as needed for muscle spasms.  Marland Kitchen triamcinolone cream (KENALOG) 0.1 % Apply 1 application topically 2 (two) times daily as needed (skin rash/irritation).   . [DISCONTINUED] Multiple Vitamin (MULTIVITAMIN WITH MINERALS) TABS tablet Take 1 tablet by mouth daily.  . [DISCONTINUED] sodium  polystyrene (KAYEXALATE) 15 GM/60ML suspension Take 60 g by mouth as needed.     Past Medical History:  Diagnosis Date  . Anemia   . Anxiety   . Arthritis   . CKD (chronic kidney disease), stage IV ([place])   . Coronary artery disease    a. 03/2014 NSTEMI/Cath: LM nl, LAD 11m, 60d, D1 60, D2 nl, d3 40, LCX 100 w/ L->L collats, RCA non dom, min irregs, EF 55%-->Med Rx; b. 09/2019 MV: EF 30-44%, large basal inf, basal inflat, mid inf, mid inflat, apical defect - partially reversible.  . DDD (degenerative disc disease)    a. s/p upper back surgery spring 2015.  . Diabetes mellitus without complication ([place])   . Diverticulosis   . Dysrhythmia   . Fibromyalgia   . GERD (gastroesophageal reflux disease)   . Hiatal hernia   . Hyperlipidemia   . Hypertension   . Hypertensive heart disease    a. 03/2015 Echo: 55-60%, mild conc LVH; b. 10/2019 Echo: EF 65-70%, mod LVH.  . Mitral stenosis    a. 10/2019 Echo: EF 65-70%, mod LVH. No rwma. Mildly dil LA. MV Ca2+ w/ mod-sev MS (mean grad 21mmHg, Valve area 1.1-1.2). Mild to mod AoV sclerosis w/o stenosis.  . Myocardial infarction ([place])   . Sleep apnea   . Syncope and collapse    a. Early 03/2014->did not seek medical attn.  . Tobacco abuse     Past Surgical History:  Procedure Laterality Date  . BREAST CYST ASPIRATION Right 2005   NEG  . CARDIAC CATHETERIZATION  06/2005   [facility]  . CARDIAC CATHETERIZATION  04/2014   [facility]  . COLONOSCOPY WITH PROPOFOL N/A 04/27/2016   Procedure: COLONOSCOPY WITH PROPOFOL;  Surgeon: Manya Silvas, MD;  Location: [facility] ENDOSCOPY;  Service: Endoscopy;  Laterality: N/A;  . COLONOSCOPY WITH PROPOFOL N/A 04/06/2020   Procedure: COLONOSCOPY WITH PROPOFOL;  Surgeon: Toledo, Benay Pike, MD;  Location: [facility] ENDOSCOPY;  Service: Gastroenterology;  Laterality: N/A;  . CORONARY ANGIOPLASTY    . DILATATION & CURETTAGE/HYSTEROSCOPY WITH MYOSURE N/A 09/18/2015   Procedure: DILATATION & CURETTAGE/HYSTEROSCOPY WITH MYOSURE;   Surgeon: Boykin Nearing, MD;  Location: [facility] ORS;  Service: Gynecology;  Laterality: N/A;  . ESOPHAGOGASTRODUODENOSCOPY (EGD) WITH PROPOFOL N/A 04/27/2016   Procedure: ESOPHAGOGASTRODUODENOSCOPY (EGD) WITH PROPOFOL;  Surgeon: Manya Silvas, MD;  Location: [facility] ENDOSCOPY;  Service: Endoscopy;  Laterality: N/A;  . NECK SURGERY     Degenerative Disk Disease and removal of a spinal cyst  . TEE WITHOUT CARDIOVERSION N/A 12/30/2019   Procedure: TRANSESOPHAGEAL ECHOCARDIOGRAM (TEE);  Surgeon: Wellington Hampshire, MD;  Location: [facility] ORS;  Service: Cardiovascular;  Laterality: N/A;  . TUBAL LIGATION      Social History Social History   Tobacco Use  . Smoking status: Current Every Day Smoker    Packs/day: 0.25    Years: 40.00    Pack years: 10.00    Types: Cigarettes  . Smokeless tobacco:  Never Used  Vaping Use  . Vaping Use: Never used  Substance Use Topics  . Alcohol use: No  . Drug use: No    Family History Family History  Problem Relation Age of Onset  . Heart disease Mother   . Heart attack Mother   . Hypertension Mother   . Hyperlipidemia Mother   . Breast cancer Sister   No family history of bleeding/clotting disorders, porphyria or autoimmune disease   Allergies  Allergen Reactions  . Aspirin Other (See Comments)    "pt. has a history of ulcers" Stomach hurts  . Hydrochlorothiazide Itching     REVIEW OF SYSTEMS (Negative unless checked)  Constitutional: [] Weight loss  [] Fever  [] Chills Cardiac: [] Chest pain   [] Chest pressure   [] Palpitations   [] Shortness of breath when laying flat   [] Shortness of breath with exertion. Vascular:  [] Pain in legs with walking   [] Pain in legs at rest  [] History of DVT   [] Phlebitis   [] Swelling in legs   [] Varicose veins   [] Non-healing ulcers Pulmonary:   [] Uses home oxygen   [] Productive cough   [] Hemoptysis   [] Wheeze  [] COPD   [] Asthma Neurologic:  [] Dizziness   [] Seizures   [] History of stroke   [] History of TIA   [] Aphasia   [] Vissual changes   [] Weakness or numbness in arm   [] Weakness or numbness in leg Musculoskeletal:   [] Joint swelling   [] Joint pain   [] Low back pain Hematologic:  [] Easy bruising  [] Easy bleeding   [] Hypercoagulable state   [] Anemic Gastrointestinal:  [] Diarrhea   [] Vomiting  [] Gastroesophageal reflux/heartburn   [] Difficulty swallowing. Genitourinary:  [x] Chronic kidney disease   [] Difficult urination  [] Frequent urination   [] Blood in urine Skin:  [] Rashes   [] Ulcers  Psychological:  [] History of anxiety   []  History of major depression.  Physical Examination  Vitals:   05/07/20 1528  BP: (!) 151/74  Pulse: 78  Weight: 152 lb (68.9 kg)  Height: 5\' 3"  (1.6 m)   Body mass index is 26.93 kg/m. Gen: WD/WN, NAD Head: [place]/AT, No temporalis wasting.  Ear/Nose/Throat: Hearing grossly intact, nares w/o erythema or drainage, poor dentition Eyes: PER, EOMI, sclera nonicteric.  Neck: Supple, no masses.  No bruit or JVD.  Pulmonary:  Good air movement, clear to auscultation bilaterally, no use of accessory muscles.  Cardiac: RRR, normal S1, S2, no Murmurs. Vascular: visible veins present at the right wrist not the left  Vessel Right Left  Radial Palpable Palpable  Brachial Palpable Palpable  Gastrointestinal: soft, non-distended. No guarding/no peritoneal signs.  Musculoskeletal: M/S 5/5 throughout.  No deformity or atrophy.  Neurologic: CN 2-12 intact. Pain and light touch intact in extremities.  Symmetrical.  Speech is fluent. Motor exam as listed above. Psychiatric: Judgment intact, Mood & affect appropriate for pt's clinical situation. Dermatologic: No rashes or ulcers noted.  No changes consistent with cellulitis.  CBC Lab Results  Component Value Date   WBC 6.4 04/20/2020   HGB 10.0 (L) 04/20/2020   HCT 31.6 (L) 04/20/2020   MCV 89.3 04/20/2020   PLT 264 04/20/2020    BMET    Component Value Date/Time   NA 143 04/20/2020 0948   NA 139 10/21/2014 1332   K 4.8  04/20/2020 0948   K 4.4 10/21/2014 1332   CL 111 04/20/2020 0948   CL 108 (H) 10/21/2014 1332   CO2 21 (L) 04/20/2020 0948   CO2 24 10/21/2014 1332   GLUCOSE 94 04/20/2020 0948  GLUCOSE 103 (H) 10/21/2014 1332   BUN 42 (H) 04/20/2020 0948   BUN 21 (H) 10/21/2014 1332   CREATININE 4.34 (H) 04/20/2020 0948   CREATININE 1.45 (H) 10/21/2014 1332   CALCIUM 8.4 (L) 04/20/2020 0948   CALCIUM 9.1 10/21/2014 1332   GFRNONAA 10 (L) 04/20/2020 0948   GFRNONAA 39 (L) 10/21/2014 1332   GFRNONAA 27 (L) 05/10/2014 0031   GFRAA 11 (L) 04/20/2020 0948   GFRAA 47 (L) 10/21/2014 1332   GFRAA 32 (L) 05/10/2014 0031   CrCl cannot be calculated (Patient's most recent lab result is older than the maximum 21 days allowed.).  COAG Lab Results  Component Value Date   INR 0.9 05/09/2014   INR 1.0 04/25/2014    Radiology DG Chest 2 View  Result Date: 04/16/2020 CLINICAL DATA:  Chest pain radiated to the right-side EXAM: CHEST - 2 VIEW COMPARISON:  None. FINDINGS: The cardiac silhouette is moderately enlarged. Both lungs are clear. Multilevel degenerative changes seen throughout the mid and lower thoracic spine. IMPRESSION: No active cardiopulmonary disease. Electronically Signed   By: Virgina Norfolk M.D.   On: 04/16/2020 18:10   CT CHEST LCS NODULE F/U W/O CONTRAST  Result Date: 05/07/2020 CLINICAL DATA:  69 year old asymptomatic female current smoker with 31.25 pack-year smoking history, presenting for six-month follow-up. EXAM: CT CHEST WITHOUT CONTRAST FOR LUNG CANCER SCREENING NODULE FOLLOW-UP TECHNIQUE: Multidetector CT imaging of the chest was performed following the standard protocol without IV contrast. COMPARISON:  11/01/2019 screening chest CT. FINDINGS: Cardiovascular: Top-normal heart size. Moderate pericardial effusion is increased. Three-vessel coronary atherosclerosis. Atherosclerotic nonaneurysmal thoracic aorta. Stable top-normal caliber main pulmonary artery (3.0 cm diameter).  Mediastinum/Nodes: Bilateral hypodense thyroid nodules, largest 1.2 cm on the left, stable. Not clinically significant; no follow-up imaging recommended (ref: J Am Coll Radiol. 2015 Feb;12(2): 143-50). Unremarkable esophagus. No pathologically enlarged axillary, mediastinal or hilar lymph nodes, noting limited sensitivity for the detection of hilar adenopathy on this noncontrast study. Lungs/Pleura: No pneumothorax. No pleural effusion. Mild centrilobular emphysema. No acute consolidative airspace disease or lung masses. No significant growth of previously visualized pulmonary nodules. No new significant pulmonary nodules. Upper abdomen: Stable bilateral adrenal adenomas measuring 2.7 cm on the right with density 3 HU and 3.5 cm on the left with density 9 HU. Musculoskeletal: No aggressive appearing focal osseous lesions. Marked thoracic spondylosis. IMPRESSION: 1. Lung-RADS 2, benign appearance or behavior. Continue annual screening with low-dose chest CT without contrast in 12 months. 2. Moderate pericardial effusion, increased. 3. Stable bilateral adrenal adenomas. 4. Aortic Atherosclerosis (ICD10-I70.0) and Emphysema (ICD10-J43.9). These results will be called to the ordering clinician or representative by the Radiologist Assistant, and communication documented in the PACS or [employer]. Electronically Signed   By: Ilona Sorrel M.D.   On: 05/07/2020 14:06   VAS [place] UPPER EXTREMITY ARTERIAL DUPLEX  Result Date: 05/07/2020 UPPER EXTREMITY DUPLEX STUDY Indications: Patient complains of Pre Op Assessment for HDA.  Performing Technologist: Almira Coaster RVS  Examination Guidelines: A complete evaluation includes B-mode imaging, spectral Doppler, color Doppler, and power Doppler as needed of all accessible portions of each vessel. Bilateral testing is considered an integral part of a complete examination. Limited examinations for reoccurring indications may be performed as noted.  Right Pre-Dialysis  Findings: +-----------------------+----------+--------------------+---------+--------+ Location               PSV (cm/s)Intralum. Diam. (cm)Waveform Comments +-----------------------+----------+--------------------+---------+--------+ Brachial Antecub. fossa66        0.65  triphasic         +-----------------------+----------+--------------------+---------+--------+ Radial Art at Wrist    87        0.33                biphasic          +-----------------------+----------+--------------------+---------+--------+ Ulnar Art at Wrist     96        0.23                biphasic          +-----------------------+----------+--------------------+---------+--------+ Left Pre-Dialysis Findings: +-----------------------+----------+--------------------+---------+--------+ Location               PSV (cm/s)Intralum. Diam. (cm)Waveform Comments +-----------------------+----------+--------------------+---------+--------+ Brachial Antecub. fossa86        0.58                triphasic         +-----------------------+----------+--------------------+---------+--------+ Radial Art at Wrist    109       0.32                biphasic          +-----------------------+----------+--------------------+---------+--------+ Ulnar Art at Wrist     79        0.24                triphasic         +-----------------------+----------+--------------------+---------+--------+  Summary:  Right: The Right Brachail Artery, Radial Art and Ulnar Artery was        mapped and Velocites were obtained. Left: The Left Brachail Artery, Radial Art and Ulnar Artery was       mapped and Velocites were obtained. *See table(s) above for measurements and observations. Electronically signed by Hortencia Pilar MD on 05/07/2020 at 5:52:58 PM.    Final    VAS [place] UPPER EXT VEIN MAPPING (PRE-OP AVF)  Result Date: 05/07/2020 UPPER EXTREMITY VEIN MAPPING  Indications: Pre-access. Performing Technologist: Almira Coaster RVS  Examination Guidelines: A complete evaluation includes B-mode imaging, spectral Doppler, color Doppler, and power Doppler as needed of all accessible portions of each vessel. Bilateral testing is considered an integral part of a complete examination. Limited examinations for reoccurring indications may be performed as noted. +-----------------+-------------+----------+--------+ Right Cephalic   Diameter (cm)Depth (cm)Findings +-----------------+-------------+----------+--------+ Shoulder             0.38                        +-----------------+-------------+----------+--------+ Prox upper arm       0.37                        +-----------------+-------------+----------+--------+ Mid upper arm        0.33                        +-----------------+-------------+----------+--------+ Dist upper arm       0.41                        +-----------------+-------------+----------+--------+ Antecubital fossa    0.46                        +-----------------+-------------+----------+--------+ Prox forearm         0.42                        +-----------------+-------------+----------+--------+ Mid forearm  0.34                        +-----------------+-------------+----------+--------+ Dist forearm         0.34                        +-----------------+-------------+----------+--------+ +-----------------+-------------+----------+--------+ Right Basilic    Diameter (cm)Depth (cm)Findings +-----------------+-------------+----------+--------+ Mid upper arm        0.47                        +-----------------+-------------+----------+--------+ Dist upper arm       0.47                        +-----------------+-------------+----------+--------+ Antecubital fossa    0.47                        +-----------------+-------------+----------+--------+ Prox forearm         0.49                         +-----------------+-------------+----------+--------+ +-----------------+-------------+----------+--------+ Left Cephalic    Diameter (cm)Depth (cm)Findings +-----------------+-------------+----------+--------+ Shoulder             0.33                        +-----------------+-------------+----------+--------+ Prox upper arm       0.22                        +-----------------+-------------+----------+--------+ Mid upper arm        0.18                        +-----------------+-------------+----------+--------+ Dist upper arm       0.24                        +-----------------+-------------+----------+--------+ Antecubital fossa    0.33                        +-----------------+-------------+----------+--------+ Prox forearm         0.25                        +-----------------+-------------+----------+--------+ Mid forearm          0.18                        +-----------------+-------------+----------+--------+ Dist forearm         0.24                        +-----------------+-------------+----------+--------+ +-----------------+-------------+----------+--------+ Left Basilic     Diameter (cm)Depth (cm)Findings +-----------------+-------------+----------+--------+ Mid upper arm        0.37                        +-----------------+-------------+----------+--------+ Dist upper arm       0.37                        +-----------------+-------------+----------+--------+ Antecubital fossa    0.28                        +-----------------+-------------+----------+--------+  Prox forearm         0.27                        +-----------------+-------------+----------+--------+ Summary: Right: Mapping the Superficial Veins in the Right Arm was performed;        no evidence of clot seen. Left: Mapping the Superficial Veins in the Leftt Arm was performed;       no evidence of clot seen. *See table(s) above for measurements and observations.   Diagnosing physician: Hortencia Pilar MD Electronically signed by Hortencia Pilar MD on 05/07/2020 at 5:53:00 PM.    Final      Assessment/Plan 1. CKD (chronic kidney disease), stage IV ([facility]) Recommend:  At this time the patient does not have appropriate extremity access for dialysis  Patient should have a right wrist fistula created.  The risks, benefits and alternative therapies were reviewed in detail with the patient.  All questions were answered.  The patient agrees to proceed with surgery.    2. Type 2 diabetes mellitus with diabetic neuropathy, unspecified whether long term insulin use ([place]) Continue hypoglycemic medications as already ordered, these medications have been reviewed and there are no changes at this time.  Hgb A1C to be monitored as already arranged by primary service   3. Coronary artery disease involving native coronary artery of native heart with other form of angina pectoris ([place]) Continue cardiac and antihypertensive medications as already ordered and reviewed, no changes at this time.  Continue statin as ordered and reviewed, no changes at this time  Nitrates PRN for chest pain   4. Essential hypertension Continue antihypertensive medications as already ordered, these medications have been reviewed and there are no changes at this time.    Hortencia Pilar, MD  05/13/2020 2:43 PM

## 2020-05-14 ENCOUNTER — Other Ambulatory Visit: Payer: Self-pay

## 2020-05-14 ENCOUNTER — Ambulatory Visit (INDEPENDENT_AMBULATORY_CARE_PROVIDER_SITE_OTHER): Payer: Medicare Other

## 2020-05-14 DIAGNOSIS — I313 Pericardial effusion (noninflammatory): Secondary | ICD-10-CM | POA: Diagnosis not present

## 2020-05-14 DIAGNOSIS — I3139 Other pericardial effusion (noninflammatory): Secondary | ICD-10-CM

## 2020-05-17 ENCOUNTER — Encounter (INDEPENDENT_AMBULATORY_CARE_PROVIDER_SITE_OTHER): Payer: Self-pay | Admitting: Vascular Surgery

## 2020-05-18 ENCOUNTER — Other Ambulatory Visit (INDEPENDENT_AMBULATORY_CARE_PROVIDER_SITE_OTHER): Payer: Self-pay | Admitting: Nurse Practitioner

## 2020-05-18 ENCOUNTER — Ambulatory Visit (INDEPENDENT_AMBULATORY_CARE_PROVIDER_SITE_OTHER): Payer: Medicare Other | Admitting: Family

## 2020-05-18 ENCOUNTER — Other Ambulatory Visit: Payer: Self-pay

## 2020-05-18 ENCOUNTER — Encounter: Payer: Self-pay | Admitting: Family

## 2020-05-18 VITALS — BP 128/60 | HR 69 | Ht 63.0 in | Wt 161.2 lb

## 2020-05-18 DIAGNOSIS — I313 Pericardial effusion (noninflammatory): Secondary | ICD-10-CM | POA: Diagnosis not present

## 2020-05-18 DIAGNOSIS — Z01810 Encounter for preprocedural cardiovascular examination: Secondary | ICD-10-CM

## 2020-05-18 DIAGNOSIS — N186 End stage renal disease: Secondary | ICD-10-CM

## 2020-05-18 DIAGNOSIS — I3139 Other pericardial effusion (noninflammatory): Secondary | ICD-10-CM

## 2020-05-18 DIAGNOSIS — I25118 Atherosclerotic heart disease of native coronary artery with other forms of angina pectoris: Secondary | ICD-10-CM

## 2020-05-18 DIAGNOSIS — I050 Rheumatic mitral stenosis: Secondary | ICD-10-CM

## 2020-05-18 DIAGNOSIS — I10 Essential (primary) hypertension: Secondary | ICD-10-CM | POA: Diagnosis not present

## 2020-05-18 DIAGNOSIS — Z720 Tobacco use: Secondary | ICD-10-CM | POA: Diagnosis not present

## 2020-05-18 NOTE — Patient Instructions (Signed)
Medication Instructions:  Your physician recommends that you continue on your current medications as directed. Please refer to the Current Medication list given to you today.  *If you need a refill on your cardiac medications before your next appointment, please call your pharmacy*   Lab Work: None Ordered. If you have labs (blood work) drawn today and your tests are completely normal, you will receive your results only by: Marland Kitchen MyChart Message (if you have MyChart) OR . A paper copy in the mail If you have any lab test that is abnormal or we need to change your treatment, we will call you to review the results.   Testing/Procedures:  Your physician has requested that you have a follow up echocardiogram in 6 weeks. Echocardiography is a painless test that uses sound waves to create images of your heart. It provides your doctor with information about the size and shape of your heart and how well your heart's chambers and valves are working. This procedure takes approximately one hour. There are no restrictions for this procedure.    Follow-Up: At [facility], you and your health needs are our priority.  As part of our continuing mission to provide you with exceptional heart care, we have created designated Provider Care Teams.  These Care Teams include your primary Cardiologist (physician) and Advanced Practice Providers (APPs -  Physician Assistants and Nurse Practitioners) who all work together to provide you with the care you need, when you need it.  We recommend signing up for the patient portal called "MyChart".  Sign up information is provided on this After Visit Summary.  MyChart is used to connect with patients for Virtual Visits (Telemedicine).  Patients are able to view lab/test results, encounter notes, upcoming appointments, etc.  Non-urgent messages can be sent to your provider as well.   To learn more about what you can do with MyChart, go to [email or web address].    Your  next appointment:   8 week(s) After echo in 6 weeks.  The format for your next appointment:   In Person  Provider:    You may see Kathlyn Sacramento, MD or one of the following Advanced Practice Providers on your designated Care Team:    Murray Hodgkins, NP  Christell Faith, PA-C  Marrianne Mood, PA-C  Laurann Montana,  NP    Other Instructions   Echocardiogram An echocardiogram is a procedure that uses painless sound waves (ultrasound) to produce an image of the heart. Images from an echocardiogram can provide important information about:  Signs of coronary artery disease (CAD).  Aneurysm detection. An aneurysm is a weak or damaged part of an artery wall that bulges out from the normal force of blood pumping through the body.  Heart size and shape. Changes in the size or shape of the heart can be associated with certain conditions, including heart failure, aneurysm, and CAD.  Heart muscle function.  Heart valve function.  Signs of a past heart attack.  Fluid buildup around the heart.  Thickening of the heart muscle.  A tumor or infectious growth around the heart valves. Tell a health care provider about:  Any allergies you have.  All medicines you are taking, including vitamins, herbs, eye drops, creams, and over-the-counter medicines.  Any blood disorders you have.  Any surgeries you have had.  Any medical conditions you have.  Whether you are pregnant or may be pregnant. What are the risks? Generally, this is a safe procedure. However, problems may occur, including:  Allergic reaction  to dye (contrast) that may be used during the procedure. What happens before the procedure? No specific preparation is needed. You may eat and drink normally. What happens during the procedure?   An IV tube may be inserted into one of your veins.  You may receive contrast through this tube. A contrast is an injection that improves the quality of the pictures from your  heart.  A gel will be applied to your chest.  A wand-like tool (transducer) will be moved over your chest. The gel will help to transmit the sound waves from the transducer.  The sound waves will harmlessly bounce off of your heart to allow the heart images to be captured in real-time motion. The images will be recorded on a computer. The procedure may vary among health care providers and hospitals. What happens after the procedure?  You may return to your normal, everyday life, including diet, activities, and medicines, unless your health care provider tells you not to do that. Summary  An echocardiogram is a procedure that uses painless sound waves (ultrasound) to produce an image of the heart.  Images from an echocardiogram can provide important information about the size and shape of your heart, heart muscle function, heart valve function, and fluid buildup around your heart.  You do not need to do anything to prepare before this procedure. You may eat and drink normally.  After the echocardiogram is completed, you may return to your normal, everyday life, unless your health care provider tells you not to do that. This information is not intended to replace advice given to you by your health care provider. Make sure you discuss any questions you have with your health care provider. Document Revised: 03/07/2019 Document Reviewed: 12/17/2016 Elsevier Patient Education  [place].

## 2020-05-18 NOTE — Progress Notes (Addendum)
Office Visit    Patient Name: Caitlin Bass Date of Encounter: 05/18/2020  Primary Care Provider:  Marguerita Merles, MD Primary Cardiologist:  Kathlyn Sacramento, MD Electrophysiologist:  None   Chief Complaint    Caitlin Bass is a 69 y.o. female with a hx of moderate mitral stenosis, tobacco abuse, anemiaDM2, HTN, CAD s/p PCI, CKDIV, MGUS, hyperkalemia, pulmonary nodules  presents today for follow up after echocardiogram with pericardial effusion.    Past Medical History    Past Medical History:  Diagnosis Date  . Anemia   . Anxiety   . Arthritis   . CKD (chronic kidney disease), stage IV ([place])   . Coronary artery disease    a. 03/2014 NSTEMI/Cath: LM nl, LAD 43m, 60d, D1 60, D2 nl, d3 40, LCX 100 w/ L->L collats, RCA non dom, min irregs, EF 55%-->Med Rx; b. 09/2019 MV: EF 30-44%, large basal inf, basal inflat, mid inf, mid inflat, apical defect - partially reversible.  . DDD (degenerative disc disease)    a. s/p upper back surgery spring 2015.  . Diabetes mellitus without complication ([place])   . Diverticulosis   . Dysrhythmia   . Fibromyalgia   . GERD (gastroesophageal reflux disease)   . Hiatal hernia   . Hyperlipidemia   . Hypertension   . Hypertensive heart disease    a. 03/2015 Echo: 55-60%, mild conc LVH; b. 10/2019 Echo: EF 65-70%, mod LVH.  . Mitral stenosis    a. 10/2019 Echo: EF 65-70%, mod LVH. No rwma. Mildly dil LA. MV Ca2+ w/ mod-sev MS (mean grad 63mmHg, Valve area 1.1-1.2). Mild to mod AoV sclerosis w/o stenosis.  . Myocardial infarction ([place])   . Sleep apnea   . Syncope and collapse    a. Early 03/2014->did not seek medical attn.  . Tobacco abuse    Past Surgical History:  Procedure Laterality Date  . BREAST CYST ASPIRATION Right 2005   NEG  . CARDIAC CATHETERIZATION  06/2005   [facility]  . CARDIAC CATHETERIZATION  04/2014   [facility]  . COLONOSCOPY WITH PROPOFOL N/A 04/27/2016   Procedure: COLONOSCOPY WITH PROPOFOL;  Surgeon: Manya Silvas, MD;   Location: [facility] END[facility]COPY;  Service: Endoscopy;  Laterality: N/A;  . COLONOSCOPY WITH PROPOFOL N/A 04/06/2020   Procedure: COLONOSCOPY WITH PROPOFOL;  Surgeon: Toledo, Benay Pike, MD;  Location: [facility] ENDOSCOPY;  Service: Gastroenterology;  Laterality: N/A;  . CORONARY ANGIOPLASTY    . DILATATION & CURETTAGE/HYSTEROSCOPY WITH MYOSURE N/A 09/18/2015   Procedure: DILATATION & CURETTAGE/HYSTEROSCOPY WITH MYOSURE;  Surgeon: Boykin Nearing, MD;  Location: [facility] ORS;  Service: Gynecology;  Laterality: N/A;  . ESOPHAGOGASTRODUODENOSCOPY (EGD) WITH PROPOFOL N/A 04/27/2016   Procedure: ESOPHAGOGASTRODUODENOSCOPY (EGD) WITH PROPOFOL;  Surgeon: Manya Silvas, MD;  Location: [facility] ENDOSCOPY;  Service: Endoscopy;  Laterality: N/A;  . NECK SURGERY     Degenerative Disk Disease and removal of a spinal cyst  . TEE WITHOUT CARDIOVERSION N/A 12/30/2019   Procedure: TRANSESOPHAGEAL ECHOCARDIOGRAM (TEE);  Surgeon: Wellington Hampshire, MD;  Location: [facility] ORS;  Service: Cardiovascular;  Laterality: N/A;  . TUBAL LIGATION      Allergies  Allergies  Allergen Reactions  . Aspirin Other (See Comments)    "pt. has a history of ulcers" Stomach hurts  . Hydrochlorothiazide Itching    History of Present Illness    Caitlin Bass is a 69 y.o. female with a hx of moderate mitral stenosis, tobacco abuse, anemia, DM2, HTN, CAD s/p PCI, CKDIV, MGUS, hyperkalemia, pulmonary  nodules    last seen for TEE 12/30/19.  Her CAD is s/p NSTEMI May 2015 with catheterization showing occluded L Cx with left to left collaterals and otherwise nonobstructive disease. Echo with normal LVEF. Echo November 2019 with hyperdynamic LV systolic function, severe LVH, and trivial pericardial effusion. Seen Servando Snare 2020 with progressive dyspnea without chest pain. Stress test showed prior inferior/inferolateral infarct with mild peri-infarct ischemia and EF 44%. Due to CKD IV, continued medical therapy was recommended by Dr. Fletcher Anon during office  visit 10/10/19. Subsequent echo with LVEF 65-70%, moderate LVH, newly noted moderate to severe mitral stenosis with mean gradient of 19mmHg and valve area 1.1-1.2.   ED visit 12/26/19 for hyperkalemia. Was sent by her nephrologist for K 6.8, at ED K 5.9, the 5.6 after treatment with insulin/dextrose/veltassa.   She underwent TEE 12/30/19 for further clarification of her MR showing moderate mitral stenosis, normal LV systolic function, GX2JJ. Valve area by pressure half-time was 1.6 cm with mean gradient of 9 mmHg.   Seen in follow up 01/10/20 with mildly elevated BP and Amlodipine increased. Seen in follow up 03/10/20 with stable DOE and no changes recommended at that time. She had CT for monitoring of her pulmonary nodules 05/07/20 showing stable appearance of lung nodules with benign appearance, stable bilateral adrenal adenomas, aortic atherosclerosis, emphysema, and moderate pericardial effusion. Recommended for echocardiogram which on 06-12-2020 showed LVEF 60-65%, moderate LVH, RV normal size/function, LA severely dilated, moderate pericardial effusion, circumferential, estimated at 1.7cm off the RA/RV free wall and 1.45 cm off the LV free wall with no hemodynamic compromise noted, moderate to severe MV stenosis with MV peak gradient 27.15mmHg, mean MV gradient 10.30mmHg,   Presents today for follow-up after echocardiogram.  Reports no chest pain, pressure contacts.  Reports no shortness of breath at rest nor dyspnea on exertion.  Reports orthopnea, PND.  Tells me her breathing continues to improve.  Tells me her worst breathing was back in February when she had sinus congestion which subsequently resolved with antibiotics.  Reports no recurrent sputum, cough, wheeze.  Does not check BP at home.  BP well controlled in clinic today.  Weight down 3 pounds compared to clinic visit 2 months ago.  Heart rate well controlled today 69 bpm.  We reviewed her echocardiogram results showing pericardial effusion.  She  has upcoming appointment Friday for dialysis access with [facility].  Enjoys spending time in her garden. Endorses eating heart healthy diet.   She has upcoming procedure 05/22/20 with vascular surgery for creation of dialysis access.   EKGs/Labs/Other Studies Reviewed:   The following studies were reviewed today: TTE 12-Jun-2020  1. Left ventricular ejection fraction, by estimation, is 60 to 65%. The  left ventricle has normal function. The left ventricle has no regional  wall motion abnormalities. There is moderate left ventricular hypertrophy.  Left ventricular diastolic  parameters are indeterminate.   2. Right ventricular systolic function is normal. The right ventricular  size is normal.   3. Left atrial size was severely dilated.   4. Moderate pericardial effusion, circumferential, estimated at 1.7 cm  off the RA/RV free wall, 1.45 cm off the LV free wall. no hemodynamic  compromise noted.   5. Severe mitral valve annular calcification, moderate mitral valve  thickening, MVA 2.29 cm sq, mean gradient of 11 mm Hg   TEE 12/30/19  1. Left ventricular ejection fraction, by visual estimation, is 60 to  65%. The left ventricle has normal function. There is no left ventricular  hypertrophy.   2. Left ventricular diastolic parameters are consistent with Grade I  diastolic dysfunction (impaired relaxation).   3. Global right ventricle has normal systolic function.The right  ventricular size is normal. No increase in right ventricular wall  thickness.   4. Left atrial size was mildly dilated.   5. Right atrial size was mildly dilated.   6. Moderate calcification of the mitral valve leaflet(s).   7. Moderately decreased mobility of the mitral valve leaflets.   8. The mitral valve is abnormal. No evidence of mitral valve  regurgitation. Moderate mitral stenosis. Valve area by pressure half time  is 1.6. Mean gradient was 9 mm Hg and peak gradient was 17 mm Hg.   9. The tricuspid valve is normal  in structure. Tricuspid valve  regurgitation is not demonstrated.  10. The aortic valve is tricuspid. Aortic valve regurgitation is trivial.  Mild aortic valve sclerosis without stenosis.  11. The pulmonic valve was normal in structure. Pulmonic valve  regurgitation is not visualized.  12. TR signal is inadequate for assessing pulmonary artery systolic  pressure.  13. The left ventricle has no regional wall motion abnormalities.   Echo 11/18/19  1. Left ventricular ejection fraction, by visual estimation, is 65 to 70%. The left ventricle has normal function. There is moderately increased left ventricular hypertrophy.  2. Left ventricular diastolic parameters are indeterminate.  3. The left ventricle has no regional wall motion abnormalities.  4. Global right ventricle has normal systolic function.The right ventricular size is normal. No increase in right ventricular wall thickness.  5. Left atrial size was mildly dilated.  6. Right atrial size was normal.  7. The pericardial effusion is posterior to the left ventricle.  8. Trivial pericardial effusion is present.  9. Severe calcification of the mitral valve leaflet(s). 10. Severe mitral annular calcification. 11. Severe thickening of the mitral valve leaflet(s). 12. The mitral valve is abnormal. No evidence of mitral valve regurgitation. Moderate-severe mitral stenosis. Mean gradient is 12 mm Hg with valve area of 1.1 to 1.2. 13. The tricuspid valve is normal in structure. Tricuspid valve regurgitation is not demonstrated. 14. The aortic valve is normal in structure. Aortic valve regurgitation is not visualized. Mild to moderate aortic valve sclerosis/calcification without any evidence of aortic stenosis. 15. The pulmonic valve was normal in structure. Pulmonic valve regurgitation is trivial. 16. TR signal is inadequate for assessing pulmonary artery systolic pressure. 17. The inferior vena cava is normal in size with greater than 50%  respiratory variability, suggesting right atrial pressure of 3 mmHg.   EKG: An EKG was ordered today. The EKG demonstrates NSR 69 bpm with LA enlargement, left axis deviation, possible old inferior infarct. No acute ST/T wave changes.   Recent Labs: 01/24/2020: ALT 23 04/20/2020: BUN 42; Creatinine, Ser 4.34; Hemoglobin 10.0; Platelets 264; Potassium 4.8; Sodium 143  Recent Lipid Panel    Component Value Date/Time   CHOL 142 11/08/2019 1538   CHOL 291 (H) 07/23/2013 0429   TRIG 437 (H) 11/08/2019 1538   TRIG 550 (H) 07/23/2013 0429   HDL 33 (L) 11/08/2019 1538   HDL 37 (L) 07/23/2013 0429   CHOLHDL 4.3 11/08/2019 1538   VLDL UNABLE TO CALCULATE IF TRIGLYCERIDE OVER 400 mg/dL 11/08/2019 1538   VLDL SEE COMMENT 07/23/2013 0429   LDLCALC UNABLE TO CALCULATE IF TRIGLYCERIDE OVER 400 mg/dL 11/08/2019 1538   LDLCALC SEE COMMENT 07/23/2013 0429   LDLDIRECT 48.6 11/08/2019 1538    Home Medications   Current Meds  Medication Sig  . allopurinol (ZYLOPRIM) 100 MG tablet Take 100 mg by mouth 2 (two) times daily.  Marland Kitchen amLODipine (NORVASC) 10 MG tablet Take 1 tablet (10 mg total) by mouth daily. (Patient taking differently: Take 10 mg by mouth at bedtime. )  . Calcium Carbonate-Vitamin D (CALCIUM 600+D) 600-400 MG-UNIT tablet Take 1 tablet by mouth daily.  . carvedilol (COREG) 12.5 MG tablet Take 1 tablet (12.5 mg total) by mouth 2 (two) times daily.  . cetirizine (ZYRTEC) 10 MG tablet Take 10 mg by mouth at bedtime.   . cholecalciferol (VITAMIN D) 25 MCG (1000 UT) tablet Take 1,000 Units by mouth daily.  . colchicine 0.6 MG tablet Take 0.6 mg by mouth daily as needed (gout flare).   . ferrous sulfate 325 (65 FE) MG tablet Take 325 mg by mouth every other day.  . fluticasone (FLONASE) 50 MCG/ACT nasal spray Place 1 spray into both nostrils 2 (two) times daily as needed (nasal congestion.).   Marland Kitchen fluticasone (VERAMYST) 27.5 MCG/SPRAY nasal spray Place 2 sprays into the nose daily.  Marland Kitchen GLUCOSAMINE HCL  PO Take 1 tablet by mouth daily.  Marland Kitchen HUMALOG KWIKPEN 100 UNIT/ML KwikPen Inject 8-28 Units into the skin See admin instructions. Inject 8 units subcutaneously in the morning, 28 units with lunch & 28 units with supper  . LANTUS SOLOSTAR 100 UNIT/ML Solostar Pen Inject 40-44 Units into the skin See admin instructions. Inject 40 units subcutaneously in the morning & inject 44 units subcutaneously at night.  . linaclotide (LINZESS) 72 MCG capsule Take 72 mcg by mouth 4 (four) times a week.   . magnesium oxide (MAG-OX) 400 MG tablet Take 400 mg by mouth 3 (three) times daily.   . Olopatadine HCl (PATADAY) 0.2 % SOLN Place 1 drop into both eyes 2 (two) times daily as needed (allergy eyes.).  Marland Kitchen Omega-3 Fatty Acids (FISH OIL MAXIMUM STRENGTH) 1200 MG CAPS Take 1,000 mg by mouth 3 (three) times daily.   . pantoprazole (PROTONIX) 20 MG tablet Take 20 mg by mouth 2 (two) times daily.   . patiromer (VELTASSA) 8.4 g packet Take 4.2 g by mouth in the morning and at bedtime.   . sodium bicarbonate 650 MG tablet Take 1,300 mg by mouth 2 (two) times daily.   . SYMBICORT 160-4.5 MCG/ACT inhaler Inhale 2 puffs into the lungs 2 (two) times daily.  Marland Kitchen tiZANidine (ZANAFLEX) 4 MG tablet Take 4 mg by mouth 3 (three) times daily as needed for muscle spasms.  Marland Kitchen triamcinolone cream (KENALOG) 0.1 % Apply 1 application topically 2 (two) times daily as needed (skin rash/irritation).     Review of Systems   Review of Systems  Constitutional: Negative for chills, fever and malaise/fatigue.  Cardiovascular: Negative for chest pain, dyspnea on exertion, leg swelling, near-syncope, orthopnea, palpitations and syncope.  Respiratory: Negative for cough, shortness of breath and wheezing.   Gastrointestinal: Negative for nausea and vomiting.  Neurological: Negative for dizziness, light-headedness and weakness.   All other systems reviewed and are otherwise negative except as noted above.  Physical Exam    VS:  BP 128/60 (BP  Location: Left Arm, Patient Position: Sitting, Cuff Size: Normal)   Pulse 69   Ht 5\' 3"  (1.6 m)   Wt 161 lb 4 oz (73.1 kg)   SpO2 98%   BMI 28.56 kg/m  , BMI Body mass index is 28.56 kg/m. GEN: Well nourished, overweight, well developed, in no acute distress. HEENT: normal. Neck: Supple, no JVD, carotid bruits,  or masses. Cardiac: RRR, no  rubs, or gallops. GR 2/6 systolic murmur. No clubbing, cyanosis, edema.  Radials/DP/PT 2+ and equal bilaterally.  Respiratory:  Respirations regular and unlabored, clear to auscultation bilaterally. GI: Soft, nontender, nondistended, BS + x 4. MS: No deformity or atrophy. Skin: Warm and dry, no rash. Neuro:  Strength and sensation are intact. Psych: Normal affect.  Assessment & Plan    1. Moderate pericardial effusion - Noted by CT 05/07/20. Echo 05/14/20 with moderate pericardial effusion with no hemodynamic compromise noted. She is asymptomatic with no DOE, SOB. Stable 2/6 systolic murmur with no additional adventitious heart or lung sounds. Discussed with Dr. Saunders Revel in clinic. As she is without hemodynamic compromise and her effusion is most likely uremic (HD access to be placed Friday) we will continue to monitor with repeat echo in 6 weeks. Educated to report new DOE, SOB. No diuretic at this time for protection of renal function.  2. Moderate mitral stenosis by TEE 12/2019 - Euvolemic on exam today and well compensated. Discussed importance of BP control, lipid control, volume management, heart rate control. HR 69bpm today, continue Coreg 12.5mg  twice daily.  3. Hyperkalemia - Follows with nephrology. Continue Veltassa.  4. DOE - Continues to improve. Reports no DOE nor SOB today.  Etiology multifactorial current tobacco abuse, deconditioning, anemia, moderate mitral stenosis, recurrent pericardial effusion.  09/2019 stress testing suggesting inferior and inferolateral infarct with mild peri-infarct ischemia, intermediate risk.  Echo 11/17/2019 with  normal LVEF.  TEE 12/2018 with moderate mitral stenosis. TTE 04/2020 with moderate pericardial effusion, as above.   5. CAD -Known circumflex lesion by cath 2015 with 09/2019 stress test showing inferior and inferior lateral infarct with mild peri-infarct ischemia.  GDMT includes aspirin, beta-blocker, statin.  Further work-up via cardiac catheterization limited by CKD.  As her mitral stenosis is moderate and not severe no indication for repeat catheterization at this time.  6. HTN -BP well controlled.  Continue carvedilol 12.5 mg twice daily, amlodipine 10 mg daily lisinopril discontinued by nephrologist 12/26/2019 due to hyperkalemia, worsening renal function.  Encouraged her to check her blood pressure at home.  7. [place] with nephrology.  Dialysis access to be placed Friday.  Anticipate her pericardial effusion is uremic.  8. HLD/hypertriglyceridemia -Recent LDL 48.6, triglyceride 437.  Continue high intensity statin.  She will continue her omega-3.    9. Pulmonary nodule -[place] with PCP.  Recent CT chest shows stable bilateral pulmonary nodule, benign.  Recommended for repeat scan in 1 year.  10. Tobacco abuse - Smoking cessation encouraged. Recommend utilization of [id number].  11. Preoperative cardiovascular clearance - Scheduled for arteriovenous fistula creation 05/22/20 with vascular surgery. As she has had continued decline in her renal function and is having effects including uremic pericardiac effusion, dialysis access creation is pertinent. She has no symptoms concerning for new or worsening angina. She has stable DOE. Recent echo showed moderate pericardial effusion, likely uremic. Plan for repeat echo in 6 weeks as above. As she shows no signs of hemodynamic compromise, she is deemed acceptable risk for her upcoming procedure without additional cardiovascular testing. According to the Revised Cardiac Risk Index (RCRI), her Perioperative Risk of Major Cardiac Event is (%): 11. Her  Functional Capacity in METs is: 4.64 according to the [employer] Activity Status Index (DASI).  Disposition: Repeat echo in 6 weeks for monitoring of pericardial effusion.  Follow up in 2 month(s) with Dr. Fletcher Anon or APP.  Loel Dubonnet, NP 05/18/2020, 9:57 AM

## 2020-05-19 ENCOUNTER — Encounter
Admission: RE | Admit: 2020-05-19 | Discharge: 2020-05-19 | Disposition: A | Discharge status: Home / Self Care | Payer: Medicare Other | Source: Ambulatory Visit | Attending: Vascular Surgery | Admitting: Vascular Surgery

## 2020-05-19 ENCOUNTER — Other Ambulatory Visit: Payer: Self-pay

## 2020-05-19 HISTORY — DX: Dyspnea, unspecified: R06.00

## 2020-05-19 NOTE — Patient Instructions (Signed)
Your procedure is scheduled on: 05/22/20 Report to [place]. To find out your arrival time please call [phone] between 1PM - 3PM on 05/21/20.  Remember: Instructions that are not followed completely may result in serious medical risk, up to and including death, or upon the discretion of your surgeon and anesthesiologist your surgery may need to be rescheduled.     _X__ 1. Do not eat food after midnight the night before your procedure.                 No gum chewing or hard candies. You may drink clear liquids up to 2 hours                 before you are scheduled to arrive for your surgery- DO not drink clear                 liquids within 2 hours of the start of your surgery.                 Clear Liquids include:  water, apple juice without pulp, clear carbohydrate                 drink such as Clearfast or Gatorade, Black Coffee or Tea (Do not add                 anything to coffee or tea). Diabetics water only  __X__2.  On the morning of surgery brush your teeth with toothpaste and water, you                 may rinse your mouth with mouthwash if you wish.  Do not swallow any              toothpaste of mouthwash.     _X__ 3.  No Alcohol for 24 hours before or after surgery.   _X__ 4.  Do Not Smoke or use e-cigarettes For 24 Hours Prior to Your Surgery.                 Do not use any chewable tobacco products for at least 6 hours prior to                 surgery.  ____  5.  Bring all medications with you on the day of surgery if instructed.   __X__  6.  Notify your doctor if there is any change in your medical condition      (cold, fever, infections).     Do not wear jewelry, make-up, hairpins, clips or nail polish. Do not wear lotions, powders, or perfumes.  Do not shave 48 hours prior to surgery. Men may shave face and neck. Do not bring valuables to the hospital.    [facility] is not responsible for any belongings or  valuables.  Contacts, dentures/partials or body piercings may not be worn into surgery. Bring a case for your contacts, glasses or hearing aids, a denture cup will be supplied. Leave your suitcase in the car. After surgery it may be brought to your room. For patients admitted to the hospital, discharge time is determined by your treatment team.   Patients discharged the day of surgery will not be allowed to drive home.   Please read over the following fact sheets that you were given:   MRSA Information  __X__ Take these medicines the morning of surgery with A SIP OF WATER:  1. allopurinol (ZYLOPRIM) 100 MG tablet  2. carvedilol (COREG) 12.5 MG tablet  3. fluticasone (VERAMYST) 27.5 MCG/SPRAY nasal spray  4. magnesium oxide (MAG-OX) 400 MG tablet  5. pantoprazole (PROTONIX) 20 MG tablet  6.  ____ Fleet Enema (as directed)   __X__ Use CHG Soap/SAGE wipes as directed  ____ Use inhalers on the day of surgery  ____ Stop metformin/Janumet/Farxiga 2 days prior to surgery    __X__ Take 1/2 of usual insulin dose the night before surgery. No insulin the morning          of surgery.   ____ Stop Blood Thinners Coumadin/Plavix/Xarelto/Pleta/Pradaxa/Eliquis/Effient/Aspirin  on   Or contact your Surgeon, Cardiologist or Medical Doctor regarding  ability to stop your blood thinners  __X__ Stop Anti-inflammatories 7 days before surgery such as Advil, Ibuprofen, Motrin,  BC or Goodies Powder, Naprosyn, Naproxen, Aleve, Aspirin    __X__ Stop all herbal supplements, fish oil or vitamin E until after surgery.    ____ Bring C-Pap to the hospital.    STOP GLUCOSAMINE AND [place]

## 2020-05-20 ENCOUNTER — Other Ambulatory Visit: Payer: Self-pay

## 2020-05-20 ENCOUNTER — Encounter: Payer: Self-pay | Admitting: Internal Medicine

## 2020-05-20 ENCOUNTER — Other Ambulatory Visit
Admission: RE | Admit: 2020-05-20 | Discharge: 2020-05-20 | Disposition: A | Discharge status: Home / Self Care | Payer: Medicare Other | Source: Ambulatory Visit | Attending: Vascular Surgery | Admitting: Vascular Surgery

## 2020-05-20 DIAGNOSIS — Z20822 Contact with and (suspected) exposure to covid-19: Secondary | ICD-10-CM | POA: Insufficient documentation

## 2020-05-20 DIAGNOSIS — Z01812 Encounter for preprocedural laboratory examination: Secondary | ICD-10-CM | POA: Diagnosis present

## 2020-05-20 LAB — TYPE AND SCREEN
ABO/RH(D): O POS
Antibody Screen: NEGATIVE

## 2020-05-20 LAB — CBC WITH DIFFERENTIAL/PLATELET
Abs Immature Granulocytes: 0.04 10*3/uL (ref 0.00–0.07)
Basophils Absolute: 0 10*3/uL (ref 0.0–0.1)
Basophils Relative: 1 %
Eosinophils Absolute: 0.1 10*3/uL (ref 0.0–0.5)
Eosinophils Relative: 2 %
HCT: 29.3 % — ABNORMAL LOW (ref 36.0–46.0)
Hemoglobin: 9.5 g/dL — ABNORMAL LOW (ref 12.0–15.0)
Immature Granulocytes: 1 %
Lymphocytes Relative: 25 %
Lymphs Abs: 1.6 10*3/uL (ref 0.7–4.0)
MCH: 28.2 pg (ref 26.0–34.0)
MCHC: 32.4 g/dL (ref 30.0–36.0)
MCV: 86.9 fL (ref 80.0–100.0)
Monocytes Absolute: 0.6 10*3/uL (ref 0.1–1.0)
Monocytes Relative: 9 %
Neutro Abs: 3.9 10*3/uL (ref 1.7–7.7)
Neutrophils Relative %: 62 %
Platelets: 332 10*3/uL (ref 150–400)
RBC: 3.37 MIL/uL — ABNORMAL LOW (ref 3.87–5.11)
RDW: 16.7 % — ABNORMAL HIGH (ref 11.5–15.5)
WBC: 6.2 10*3/uL (ref 4.0–10.5)
nRBC: 0 % (ref 0.0–0.2)

## 2020-05-20 LAB — APTT: aPTT: 37 seconds — ABNORMAL HIGH (ref 24–36)

## 2020-05-20 LAB — BASIC METABOLIC PANEL
Anion gap: 10 (ref 5–15)
BUN: 37 mg/dL — ABNORMAL HIGH (ref 8–23)
CO2: 22 mmol/L (ref 22–32)
Calcium: 8 mg/dL — ABNORMAL LOW (ref 8.9–10.3)
Chloride: 111 mmol/L (ref 98–111)
Creatinine, Ser: 3.84 mg/dL — ABNORMAL HIGH (ref 0.44–1.00)
GFR calc Af Amer: 13 mL/min — ABNORMAL LOW (ref >60)
GFR calc non Af Amer: 11 mL/min — ABNORMAL LOW (ref >60)
Glucose, Bld: 155 mg/dL — ABNORMAL HIGH (ref 70–99)
Potassium: 5.3 mmol/L — ABNORMAL HIGH (ref 3.5–5.1)
Sodium: 143 mmol/L (ref 135–145)

## 2020-05-20 LAB — PROTIME-INR
INR: 1.1 (ref 0.8–1.2)
Prothrombin Time: 13.3 seconds (ref 11.4–15.2)

## 2020-05-20 LAB — SARS CORONAVIRUS 2 (TAT 6-24 HRS): SARS Coronavirus 2: NEGATIVE

## 2020-05-20 NOTE — Progress Notes (Signed)
[facility] Perioperative Services  Pre-Admission/Anesthesia Testing Clinical Review  Date: 05/21/20  Patient Demographics:  Name: Caitlin Bass DOB:   Jan 18, 1951 MRN:   [id number]  Planned Surgical Procedure(s):    Case: [id number] Date/Time: 05/22/20 0837   Procedure: ARTERIOVENOUS (AV) FISTULA CREATION (WRIST ) (Right )   Anesthesia type: General   Pre-op diagnosis: CHRONIC KIDNEY DISEASE   Location: [facility]   Surgeons: Katha Cabal, MD     NOTE: Available PAT nursing documentation and vital signs have been reviewed. Clinical nursing staff has updated patient's PMH/PSHx, current medication list, and drug allergies/intolerances to ensure comprehensive history available to assist in medical decision making as it pertains to the aforementioned surgical procedure and anticipated anesthetic course.   Clinical Discussion:  Caitlin Bass is a 69 y.o. female who is submitted for pre-surgical anesthesia review and clearance prior to her undergoing the above procedure. Patient is a Current Smoker (10 pack years). Pertinent PMH includes: CAD, hypertensive heart disease, NSTEMI in 2015 (underwent PCI; MLAD 20%, DLAD 60%, D1 60%, D3 40%, LCx 100%) , HLD, HTN, dysrhythmia, pericardial effusion (felt to be uremic), dyspnea, MV stenosis, CKD-IV, hyperkalemia (2/2 hypoaldosterism state r/t DM; on daily patiromer), anemia, DM, GERD with hiatal hernia, OSAH requiring nocturnal PAP therapy, lung nodules.   Patient with extensive cardiac history as noted above. She is followed by cardiology Fletcher Anon, MD). Patient last seen by cardiology office on 05/18/2020; notes reviewed. At that time, patient denied CP and SOB; endorsed orthopnea. Follow up echo done on 05/14/2020 revealed an EF of 60-65% with not RWMA and a moderate pericardial effusion (see full interpretation below). Cardiology aware of patient's pending vascular access procedure  needed for hemodialysis. Given her extensive cardiac history I reached out to cardiology for clearance prior to her procedure. Cardiology provider indicates that "As she shows no signs of hemodynamic compromise, she is deemed acceptable risk for her upcoming procedure without additional cardiovascular testing. According to the Revised Cardiac Risk Index (RCRI), her Perioperative Risk of Major Cardiac Event is (%): 11. Her Functional Capacity in METs is: 4.64 according to the Duke Activity Status Index (DASI)".  She denies previous intra-operative complications with anesthesia. This patient is not on daily anticoagulation therapy.  Lab Results  Component Value Date   [place] NEGATIVE 05/20/2020     Today's Vitals   05/19/20 0927  Weight: 73 kg  Height: 5\' 3"  (1.6 m)   Body mass index is 28.52 kg/m.  Providers/Specialists:   PROVIDER ROLE LAST Rachel Moulds, MD Vascular (Surgeon) 05/07/2020  Marguerita Merles, MD Primary Care Provider   Kathlyn Sacramento, MD Cardiologist 05/18/2020  Era Skeen, MD Nephrologist 03/25/2020   Allergies:  Aspirin and Hydrochlorothiazide  Current Home Medications:   . allopurinol (ZYLOPRIM) 100 MG tablet  . amLODipine (NORVASC) 10 MG tablet  . Calcium Carbonate-Vitamin D (CALCIUM 600+D) 600-400 MG-UNIT tablet  . carvedilol (COREG) 12.5 MG tablet  . cetirizine (ZYRTEC) 10 MG tablet  . cholecalciferol (VITAMIN D) 25 MCG (1000 UT) tablet  . colchicine 0.6 MG tablet  . ferrous sulfate 325 (65 FE) MG tablet  . fluticasone (FLONASE) 50 MCG/ACT nasal spray  . fluticasone (VERAMYST) 27.5 MCG/SPRAY nasal spray  . GLUCOSAMINE HCL PO  . HUMALOG KWIKPEN 100 UNIT/ML KwikPen  . LANTUS SOLOSTAR 100 UNIT/ML Solostar Pen  . linaclotide (LINZESS) 72 MCG capsule  . magnesium oxide (MAG-OX) 400 MG tablet  . Olopatadine HCl (  PATADAY) 0.2 % SOLN  . Omega-3 Fatty Acids (FISH OIL MAXIMUM STRENGTH) 1200 MG CAPS  . pantoprazole (PROTONIX) 20 MG tablet  .  patiromer (VELTASSA) 8.4 g packet  . rosuvastatin (CRESTOR) 40 MG tablet  . sodium bicarbonate 650 MG tablet  . SYMBICORT 160-4.5 MCG/ACT inhaler  . tiZANidine (ZANAFLEX) 4 MG tablet  . triamcinolone cream (KENALOG) 0.1 %   No current facility-administered medications for this encounter.   History:   Past Medical History:  Diagnosis Date  . Anemia   . Anxiety   . Arthritis   . CKD (chronic kidney disease), stage IV ([place])   . Coronary artery disease    a. 03/2014 NSTEMI/Cath: LM nl, LAD 47m, 60d, D1 60, D2 nl, d3 40, LCX 100 w/ L->L collats, RCA non dom, min irregs, EF 55%-->Med Rx; b. 09/2019 MV: EF 30-44%, large basal inf, basal inflat, mid inf, mid inflat, apical defect - partially reversible.  . DDD (degenerative disc disease)    a. s/p upper back surgery spring 2015.  . Diabetes mellitus without complication ([place])   . Diverticulosis   . Dyspnea   . Dysrhythmia   . Fibromyalgia   . GERD (gastroesophageal reflux disease)   . Hiatal hernia   . Hyperlipidemia   . Hypertension   . Hypertensive heart disease    a. 03/2015 Echo: 55-60%, mild conc LVH; b. 10/2019 Echo: EF 65-70%, mod LVH.  . Mitral stenosis    a. 10/2019 Echo: EF 65-70%, mod LVH. No rwma. Mildly dil LA. MV Ca2+ w/ mod-sev MS (mean grad 42mmHg, Valve area 1.1-1.2). Mild to mod AoV sclerosis w/o stenosis.  . Myocardial infarction ([place])   . Sleep apnea   . Syncope and collapse    a. Early 03/2014->did not seek medical attn.  . Tobacco abuse    Past Surgical History:  Procedure Laterality Date  . BREAST CYST ASPIRATION Right 2005   NEG  . CARDIAC CATHETERIZATION  06/2005   [facility]  . CARDIAC CATHETERIZATION  04/2014   [facility]  . COLONOSCOPY WITH PROPOFOL N/A 04/27/2016   Procedure: COLONOSCOPY WITH PROPOFOL;  Surgeon: Manya Silvas, MD;  Location: [facility] ENDOSCOPY;  Service: Endoscopy;  Laterality: N/A;  . COLONOSCOPY WITH PROPOFOL N/A 04/06/2020   Procedure: COLONOSCOPY WITH PROPOFOL;  Surgeon: Toledo, Benay Pike, MD;   Location: [facility] ENDOSCOPY;  Service: Gastroenterology;  Laterality: N/A;  . CORONARY ANGIOPLASTY    . DILATATION & CURETTAGE/HYSTEROSCOPY WITH MYOSURE N/A 09/18/2015   Procedure: DILATATION & CURETTAGE/HYSTEROSCOPY WITH MYOSURE;  Surgeon: Boykin Nearing, MD;  Location: [facility] ORS;  Service: Gynecology;  Laterality: N/A;  . ESOPHAGOGASTRODUODENOSCOPY (EGD) WITH PROPOFOL N/A 04/27/2016   Procedure: ESOPHAGOGASTRODUODENOSCOPY (EGD) WITH PROPOFOL;  Surgeon: Manya Silvas, MD;  Location: [facility] ENDOSCOPY;  Service: Endoscopy;  Laterality: N/A;  . NECK SURGERY     Degenerative Disk Disease and removal of a spinal cyst  . TEE WITHOUT CARDIOVERSION N/A 12/30/2019   Procedure: TRANSESOPHAGEAL ECHOCARDIOGRAM (TEE);  Surgeon: Wellington Hampshire, MD;  Location: [facility] ORS;  Service: Cardiovascular;  Laterality: N/A;  . TUBAL LIGATION     Family History  Problem Relation Age of Onset  . Heart disease Mother   . Heart attack Mother   . Hypertension Mother   . Hyperlipidemia Mother   . Breast cancer Sister    Social History   Tobacco Use  . Smoking status: Current Every Day Smoker    Packs/day: 0.25    Years: 40.00    Pack years: 10.00  Types: Cigarettes  . Smokeless tobacco: Never Used  Vaping Use  . Vaping Use: Never used  Substance Use Topics  . Alcohol use: No  . Drug use: Yes    Types: Marijuana    Pertinent Clinical Results:  No orders of the defined types were placed in this encounter.  LABS: Labs reviewed. Patient has a CKD-IV diagnosis Lab Results  Component Value Date   WBC 6.2 05/20/2020   HGB 9.5 (L) 05/20/2020   HCT 29.3 (L) 05/20/2020   MCV 86.9 05/20/2020   PLT 332 05/20/2020   Lab Results  Component Value Date   NA 143 05/20/2020   K 5.3 (H) 05/20/2020   BUN 37 (H) 05/20/2020   CREATININE 3.84 (H) 05/20/2020   CALCIUM 8.0 (L) 05/20/2020   Estimated Creatinine Clearance: 13.4 mL/min (A) (by C-G formula based on SCr of 3.84 mg/dL (H)).   EKG: Date:  05/18/2020 Time ECG obtained: 0850 Rate: 69 bpm Rhythm: normal sinus Axis (leads I and aVF): Left axis deviation Intervals: PR 146 ms. QTc 445 ms. ST segment and T wave changes: No evidence of ST segment elevation or depression Comparison: Similar to previous tracing obtained on 04/17/2020.  IMAGING / PROCEDURES:  ECHOCARDIOGRAM done on 05/14/2020 1. Left ventricular ejection fraction, by estimation, is 60 to 65%. The left ventricle has normal function. The left ventricle has no regional wall motion abnormalities. There is moderate left ventricular hypertrophy. Left ventricular diastolic parameters are indeterminate.  2. Right ventricular systolic function is normal. The right ventricular size is normal.  3. Left atrial size was severely dilated.  4. Moderate pericardial effusion, circumferential, estimated at 1.7 cm off the RA/RV free wall, 1.45 cm off the LV free wall. no hemodynamic compromise noted.  5. Severe mitral valve annular calcification, moderate mitral valve thickening, MVA 2.29 cm sq, mean gradient of 11 mm Hg    CHEST CT NODULE F/U W/O CONTRAST done on 05/07/2020 1. Lung-RADS 2, benign appearance or behavior. Continue annual screening with low-dose chest CT without contrast in 12 months. 2. Moderate pericardial effusion, increased. 3. Stable bilateral adrenal adenomas. 4. Aortic Atherosclerosis (ICD10-I70.0) and Emphysema (ICD10-J43.9).  Impression and Plan:  ANALLELI GIERKE has been referred for pre-anesthesia review and clearance prior her undergoing the planned anesthetic and procedural courses. Available labs, pertinent testing, and imaging results were personally reviewed by me. This patient has been appropriately cleared by cardiology. Based on clinical review performed today (05/20/20), barring and significant acute changes in the patient's overall condition, it is anticipated that she will be able to proceed with the planned surgical intervention. Any acute changes  in clinical condition may necessitate her procedure being postponed and/or cancelled. Pre-surgical instructions were reviewed with the patient during her PAT appointment and questions were fielded by PAT clinical staff.  Caitlin Loh, MSN, APRN, FNP-C, CEN [facility]  Peri-operative Services Nurse Bass Phone: [phone] 05/20/2020  NOTE: This note has been prepared using Dragon dictation software. Despite my best ability to proofread, there is always the potential that unintentional transcriptional errors may still occur from this process.

## 2020-05-20 NOTE — Progress Notes (Signed)
Spoke with patient prior to her MD apt with Dr. Rogue Bussing. Patient is having a vascular shunt placed on Friday for dialysis. Labs for Dr. b cnl as patient had labs drawn today with preop (per pt's request).

## 2020-05-20 NOTE — Progress Notes (Signed)
  [facility] Perioperative Services: Pre-Admission/Anesthesia Testing  Abnormal Lab Notification  Date: 05/20/20  Name: Caitlin Bass MRN:   [id number]  Re: Abnormal labs noted during PAT appointment  Provider Notified: Eulogio Ditch, NP Notification mode: Routed via [facility] of concern:  K+ 5.3 mmol/L  BUN 37 and creatinine 3.84 mg/dL. Estimated Creatinine Clearance: 13.4 mL/min (A) (by C-G formula based on SCr of 3.84 mg/dL (H)).   Calcium 8.0 mg/dL.  Honor Loh, MSN, APRN, FNP-C, CEN [facility]  Peri-operative Services Nurse Practitioner Phone: [phone] 05/20/20 2:52 PM

## 2020-05-21 ENCOUNTER — Inpatient Hospital Stay: Payer: Medicare Other

## 2020-05-21 ENCOUNTER — Inpatient Hospital Stay: Payer: Medicare Other | Attending: Internal Medicine | Admitting: Internal Medicine

## 2020-05-21 DIAGNOSIS — N184 Chronic kidney disease, stage 4 (severe): Secondary | ICD-10-CM | POA: Insufficient documentation

## 2020-05-21 DIAGNOSIS — D508 Other iron deficiency anemias: Secondary | ICD-10-CM

## 2020-05-21 DIAGNOSIS — D631 Anemia in chronic kidney disease: Secondary | ICD-10-CM | POA: Insufficient documentation

## 2020-05-21 DIAGNOSIS — E875 Hyperkalemia: Secondary | ICD-10-CM | POA: Diagnosis not present

## 2020-05-21 MED ORDER — EPOETIN ALFA-EPBX 10000 UNIT/ML IJ SOLN
20000.0000 [IU] | Freq: Once | INTRAMUSCULAR | Status: AC
  Administered 2020-05-21: 20000 [IU] via SUBCUTANEOUS
  Filled 2020-05-21: qty 2

## 2020-05-21 NOTE — Assessment & Plan Note (Addendum)
#   Anemia Iron deficiency- hemoglobin today - 9.5  On PO iron once a day.  Stable; S/p Ferrahem. Proceed with retacrt today.  # Mild hyperkalemia.  Potassium 5.3. on  Veltessa; Recommend close monitoring with nephrology.  # CKD-stage IV creatinine-GFR-12 slow worsening; awaiting fistula placement;   # DISPOSITION: # proceed with Retacrit today # 1 month- cbc/bmp- possible retacrit # Follow-up 2 months/MD-labs CBC BMP- possible retacrit- Dr.B  Cc; Dr.Swain

## 2020-05-21 NOTE — Anesthesia Preprocedure Evaluation (Addendum)
Anesthesia Evaluation  Patient identified by MRN, date of birth, ID band Patient awake    Reviewed: Allergy & Precautions, NPO status , Patient's Chart, lab work & pertinent test results  History of Anesthesia Complications Negative for: history of anesthetic complications  Airway Mallampati: III  TM Distance: >3 FB Neck ROM: Full    Dental  (+) Upper Dentures, Edentulous Lower, Dental Advisory Given   Pulmonary shortness of breath and with exertion, sleep apnea and Continuous Positive Airway Pressure Ventilation , neg COPD, Patient abstained from smoking.Not current smoker,    Pulmonary exam normal breath sounds clear to auscultation       Cardiovascular Exercise Tolerance: Good METShypertension, + CAD, + Past MI and + Cardiac Stents  + dysrhythmias  Rhythm:Regular Rate:Normal - Systolic murmurs    Neuro/Psych Anxiety  Neuromuscular disease negative psych ROS   GI/Hepatic hiatal hernia, GERD  Controlled,(+)     (-) substance abuse  ,   Endo/Other  diabetes  Renal/GU ESRFRenal disease     Musculoskeletal  (+) Arthritis ,   Abdominal   Peds  Hematology  (+) anemia ,   Anesthesia Other Findings Past Medical History: No date: Anemia No date: Anxiety No date: Arthritis No date: CKD (chronic kidney disease), stage IV (HCC) No date: Coronary artery disease     Comment:  a. 03/2014 NSTEMI/Cath: LM nl, LAD 18m, 60d, D1 60, D2               nl, d3 40, LCX 100 w/ L->L collats, RCA non dom, min               irregs, EF 55%-->Med Rx; b. 09/2019 MV: EF 30-44%, large               basal inf, basal inflat, mid inf, mid inflat, apical               defect - partially reversible. No date: DDD (degenerative disc disease)     Comment:  a. s/p upper back surgery spring 2015. No date: Diabetes mellitus without complication (HCC) No date: Diverticulosis No date: Dyspnea No date: Dysrhythmia No date: Fibromyalgia No date:  GERD (gastroesophageal reflux disease) No date: Hiatal hernia No date: Hyperlipidemia No date: Hypertension No date: Hypertensive heart disease     Comment:  a. 03/2015 Echo: 55-60%, mild conc LVH; b. 10/2019 Echo:               EF 65-70%, mod LVH. No date: Mitral stenosis     Comment:  a. 10/2019 Echo: EF 65-70%, mod LVH. No rwma. Mildly dil              LA. MV Ca2+ w/ mod-sev MS (mean grad 64mmHg, Valve area               1.1-1.2). Mild to mod AoV sclerosis w/o stenosis. No date: Myocardial infarction ([place]) No date: Sleep apnea No date: Syncope and collapse     Comment:  a. Early 03/2014->did not seek medical attn. No date: Tobacco abuse  Reproductive/Obstetrics                                                             Anesthesia Evaluation  Patient identified by MRN, date of birth, ID band Patient awake  Reviewed: Allergy & Precautions, NPO status , Patient's Chart, lab work & pertinent test results  History of Anesthesia Complications Negative for: history of anesthetic complications  Airway Mallampati: III  TM Distance: >3 FB Neck ROM: Full    Dental  (+) Poor Dentition, Missing   Pulmonary sleep apnea and Continuous Positive Airway Pressure Ventilation , neg COPD, Current Smoker,    breath sounds clear to auscultation- rhonchi (-) wheezing      Cardiovascular hypertension, Pt. on medications + CAD, + Past MI and + Cardiac Stents  (-) CABG + dysrhythmias  Rhythm:Regular Rate:Normal - Systolic murmurs and - Diastolic murmurs    Neuro/Psych neg Seizures Anxiety negative neurological ROS     GI/Hepatic Neg liver ROS, hiatal hernia, GERD  ,  Endo/Other  diabetes, Insulin Dependent  Renal/GU CRFRenal disease     Musculoskeletal  (+) Arthritis , Fibromyalgia -  Abdominal (+) + obese,   Peds  Hematology  (+) anemia ,   Anesthesia Other Findings Past Medical History: No date: Anemia No date: Anxiety No date:  Arthritis No date: CKD (chronic kidney disease), stage IV (HCC) No date: Coronary artery disease     Comment:  a. 03/2014 NSTEMI/Cath: LM nl, LAD 63m, 60d, D1 60, D2               nl, d3 40, LCX 100 w/ L->L collats, RCA non dom, min               irregs, EF 55%-->Med Rx; b. 09/2019 MV: EF 30-44%, large               basal inf, basal inflat, mid inf, mid inflat, apical               defect - partially reversible. No date: DDD (degenerative disc disease)     Comment:  a. s/p upper back surgery spring 2015. No date: Diabetes mellitus without complication (HCC) No date: Diverticulosis No date: Dysrhythmia No date: Fibromyalgia No date: GERD (gastroesophageal reflux disease) No date: Hiatal hernia No date: Hyperlipidemia No date: Hypertension No date: Hypertensive heart disease     Comment:  a. 03/2015 Echo: 55-60%, mild conc LVH; b. 10/2019 Echo:               EF 65-70%, mod LVH. No date: Mitral stenosis     Comment:  a. 10/2019 Echo: EF 65-70%, mod LVH. No rwma. Mildly dil              LA. MV Ca2+ w/ mod-sev MS (mean grad 36mmHg, Valve area               1.1-1.2). Mild to mod AoV sclerosis w/o stenosis. No date: Myocardial infarction ([place]) No date: Sleep apnea No date: Syncope and collapse     Comment:  a. Early 03/2014->did not seek medical attn. No date: Tobacco abuse   Reproductive/Obstetrics                             Anesthesia Physical Anesthesia Plan  ASA: III  Anesthesia Plan: General   Post-op Pain Management:    Induction: Intravenous  PONV Risk Score and Plan: 1 and Propofol infusion  Airway Management Planned: Natural Airway  Additional Equipment:   Intra-op Plan:   Post-operative Plan:   Informed Consent: I have reviewed the patients History and Physical, chart, labs and discussed the procedure including the risks, benefits and alternatives for the proposed  anesthesia with the patient or authorized representative who has indicated  his/her understanding and acceptance.     Dental advisory given  Plan Discussed with: CRNA and Anesthesiologist  Anesthesia Plan Comments:         Anesthesia Quick Evaluation  Anesthesia Physical Anesthesia Plan  ASA: III  Anesthesia Plan: Regional   Post-op Pain Management:    Induction: Intravenous  PONV Risk Score and Plan: 2 and Ondansetron, Propofol infusion and TIVA  Airway Management Planned: Nasal Cannula  Additional Equipment: None  Intra-op Plan:   Post-operative Plan:   Informed Consent: I have reviewed the patients History and Physical, chart, labs and discussed the procedure including the risks, benefits and alternatives for the proposed anesthesia with the patient or authorized representative who has indicated his/her understanding and acceptance.     Dental advisory given  Plan Discussed with: CRNA and Surgeon  Anesthesia Plan Comments: (Discussed r/b/a of supraclavicular nerve block, including:  - bleeding, infection, nerve damage - pneumothorax - shortness of breath from hemidiaphragmatic paralysis due to phrenic nerve blockade - poor or non functioning block. Patient understands.  Discussed risks of anesthesia with patient, including possibility of difficulty with spontaneous ventilation under anesthesia necessitating airway intervention, PONV, and rare risks such as cardiac or respiratory or neurological events. Patient understands.)      Anesthesia Quick Evaluation                                  Anesthesia Evaluation  Patient identified by MRN, date of birth, ID band Patient awake    Reviewed: Allergy & Precautions, NPO status , Patient's Chart, lab work & pertinent test results  History of Anesthesia Complications Negative for: history of anesthetic complications  Airway Mallampati: III  TM Distance: >3 FB Neck ROM: Full    Dental  (+) Poor Dentition, Missing   Pulmonary sleep apnea and Continuous Positive Airway  Pressure Ventilation , neg COPD, Current Smoker,    breath sounds clear to auscultation- rhonchi (-) wheezing      Cardiovascular hypertension, Pt. on medications + CAD, + Past MI and + Cardiac Stents  (-) CABG + dysrhythmias  Rhythm:Regular Rate:Normal - Systolic murmurs and - Diastolic murmurs    Neuro/Psych neg Seizures Anxiety negative neurological ROS     GI/Hepatic Neg liver ROS, hiatal hernia, GERD  ,  Endo/Other  diabetes, Insulin Dependent  Renal/GU CRFRenal disease     Musculoskeletal  (+) Arthritis , Fibromyalgia -  Abdominal (+) + obese,   Peds  Hematology  (+) anemia ,   Anesthesia Other Findings Past Medical History: No date: Anemia No date: Anxiety No date: Arthritis No date: CKD (chronic kidney disease), stage IV (HCC) No date: Coronary artery disease     Comment:  a. 03/2014 NSTEMI/Cath: LM nl, LAD 25m, 60d, D1 60, D2               nl, d3 40, LCX 100 w/ L->L collats, RCA non dom, min               irregs, EF 55%-->Med Rx; b. 09/2019 MV: EF 30-44%, large               basal inf, basal inflat, mid inf, mid inflat, apical               defect - partially reversible. No date: DDD (degenerative disc disease)     Comment:  a.  s/p upper back surgery spring 2015. No date: Diabetes mellitus without complication (HCC) No date: Diverticulosis No date: Dysrhythmia No date: Fibromyalgia No date: GERD (gastroesophageal reflux disease) No date: Hiatal hernia No date: Hyperlipidemia No date: Hypertension No date: Hypertensive heart disease     Comment:  a. 03/2015 Echo: 55-60%, mild conc LVH; b. 10/2019 Echo:               EF 65-70%, mod LVH. No date: Mitral stenosis     Comment:  a. 10/2019 Echo: EF 65-70%, mod LVH. No rwma. Mildly dil              LA. MV Ca2+ w/ mod-sev MS (mean grad 26mmHg, Valve area               1.1-1.2). Mild to mod AoV sclerosis w/o stenosis. No date: Myocardial infarction ([place]) No date: Sleep apnea No date: Syncope and  collapse     Comment:  a. Early 03/2014->did not seek medical attn. No date: Tobacco abuse   Reproductive/Obstetrics                             Anesthesia Physical Anesthesia Plan  ASA: III  Anesthesia Plan: General   Post-op Pain Management:    Induction: Intravenous  PONV Risk Score and Plan: 1 and Propofol infusion  Airway Management Planned: Natural Airway  Additional Equipment:   Intra-op Plan:   Post-operative Plan:   Informed Consent: I have reviewed the patients History and Physical, chart, labs and discussed the procedure including the risks, benefits and alternatives for the proposed anesthesia with the patient or authorized representative who has indicated his/her understanding and acceptance.     Dental advisory given  Plan Discussed with: CRNA and Anesthesiologist  Anesthesia Plan Comments:         Anesthesia Quick Evaluation                                   Anesthesia Evaluation  Patient identified by MRN, date of birth, ID band Patient awake    Reviewed: Allergy & Precautions, H&P , NPO status , Patient's Chart, lab work & pertinent test results  History of Anesthesia Complications Negative for: history of anesthetic complications  Airway Mallampati: III  TM Distance: <3 FB Neck ROM: limited    Dental  (+) Poor Dentition, Chipped, Missing, Upper Dentures   Pulmonary neg shortness of breath, Current Smoker,    Pulmonary exam normal breath sounds clear to auscultation       Cardiovascular Exercise Tolerance: Good hypertension, (-) angina+ CAD, + Past MI and + Cardiac Stents  (-) DOE Normal cardiovascular exam Rhythm:regular Rate:Normal     Neuro/Psych PSYCHIATRIC DISORDERS Anxiety  Neuromuscular disease    GI/Hepatic Neg liver ROS, hiatal hernia, GERD  Controlled and Medicated,  Endo/Other  diabetes, Type 2, Insulin Dependent  Renal/GU Renal disease  negative genitourinary    Musculoskeletal  (+) Arthritis , Fibromyalgia -  Abdominal   Peds  Hematology negative hematology ROS (+)   Anesthesia Other Findings Past Medical History:   Coronary artery disease                                        Comment:a. 03/2014 NSTEMI/Cath: LM nl, LAD 62m, 60d, D1  60, D2 nl, d3 40, LCX 100 w/ L->L collats, RCA               non dom, min irregs, EF 55%-->Med Rx.   Diabetes mellitus without complication ([place])                 Hypertensive heart disease                                     Comment:a. 03/2015 Echo: 55-60%, mild conc LVH.   CKD (chronic kidney disease), stage IV (HCC)                 GERD (gastroesophageal reflux disease)                       DDD (degenerative disc disease)                                Comment:a. s/p upper back surgery spring 2015.   Arthritis                                                    Hiatal hernia                                                Hyperlipidemia                                               Syncope and collapse                                           Comment:a. Early 03/2014->did not seek medical attn.   Anxiety                                                      Fibromyalgia                                                 Anemia                                                       Diverticulosis  Tobacco abuse                                               Past Surgical History:   NECK SURGERY                                                    Comment:Degenerative Disk Disease and removal of a               spinal cyst   CARDIAC CATHETERIZATION                          06/2005         Comment:[facility]   CARDIAC CATHETERIZATION                          04/2014         Comment:[facility]   TUBAL LIGATION                                                BREAST CYST ASPIRATION                          Right 2005           Comment:NEG   CORONARY ANGIOPLASTY                                           DILATATION & CURETTAGE/HYSTEROSCOPY WITH MYOSU* N/A 09/18/2015     Comment:Procedure: DILATATION & CURETTAGE/HYSTEROSCOPY               WITH MYOSURE;  Surgeon: Boykin Nearing,               MD;  Location: [facility] ORS;  Service: Gynecology;               Laterality: N/A;  BMI    Body Mass Index   27.45 kg/m 2      Reproductive/Obstetrics negative OB ROS                             Anesthesia Physical Anesthesia Plan  ASA: III  Anesthesia Plan: General   Post-op Pain Management:    Induction:   Airway Management Planned:   Additional Equipment:   Intra-op Plan:   Post-operative Plan:   Informed Consent: I have reviewed the patients History and Physical, chart, labs and discussed the procedure including the risks, benefits and alternatives for the proposed anesthesia with the patient or authorized representative who has indicated his/her understanding and acceptance.   Dental Advisory Given  Plan Discussed with: Anesthesiologist, CRNA and Surgeon  Anesthesia Plan Comments:         Anesthesia Quick Evaluation

## 2020-05-21 NOTE — Progress Notes (Signed)
[place] OFFICE PROGRESS NOTE  Patient Care Team: Marguerita Merles, MD as PCP - General (Family Medicine) Wellington Hampshire, MD as PCP - Cardiology (Cardiology)   SUMMARY OF ONCOLOGIC HISTORY:  # March 2017- Anemia- Hb 8; plan Iv iron s/p IV iron x2 [[april 2017]; [place] May 2017.   # CKD [? nephrologist as per pt; ? Dr.Swain]/ CAD  # AUG 2016- ELEVATED K/L ratio 1.99; SPEP- Neg.    INTERVAL HISTORY:  69 year-old pleasant female patient with above history of anemia secondary to iron deficiency/CKD.  Patient is awaiting fistula placement tomorrow.  Denies any nausea vomiting abdominal pain pain no blood in stools.  Chronic fatigue chronic shortness of breath on exertion.   Review of Systems  Constitutional: Positive for malaise/fatigue. Negative for chills, diaphoresis, fever and weight loss.  HENT: Negative for nosebleeds and sore throat.   Eyes: Negative for double vision.  Respiratory: Positive for shortness of breath. Negative for cough, hemoptysis, sputum production and wheezing.   Cardiovascular: Negative for chest pain, palpitations, orthopnea and leg swelling.  Gastrointestinal: Negative for abdominal pain, blood in stool, constipation, diarrhea, heartburn, melena, nausea and vomiting.  Genitourinary: Negative for dysuria, frequency and urgency.  Musculoskeletal: Negative for back pain and joint pain.  Skin: Negative.  Negative for itching and rash.  Neurological: Negative for dizziness, tingling, focal weakness, weakness and headaches.  Endo/Heme/Allergies: Does not bruise/bleed easily.  Psychiatric/Behavioral: Negative for depression. The patient is not nervous/anxious and does not have insomnia.      PAST MEDICAL HISTORY :  Past Medical History:  Diagnosis Date  . Anemia   . Anxiety   . Arthritis   . CKD (chronic kidney disease), stage IV ([place])   . Coronary artery disease    a. 03/2014 NSTEMI/Cath: LM nl, LAD 19m, 60d, D1 60, D2 nl,  d3 40, LCX 100 w/ L->L collats, RCA non dom, min irregs, EF 55%-->Med Rx; b. 09/2019 MV: EF 30-44%, large basal inf, basal inflat, mid inf, mid inflat, apical defect - partially reversible.  . DDD (degenerative disc disease)    a. s/p upper back surgery spring 2015.  . Diabetes mellitus without complication ([place])   . Diverticulosis   . Dyspnea   . Dysrhythmia   . Fibromyalgia   . GERD (gastroesophageal reflux disease)   . Hiatal hernia   . Hyperlipidemia   . Hypertension   . Hypertensive heart disease    a. 03/2015 Echo: 55-60%, mild conc LVH; b. 10/2019 Echo: EF 65-70%, mod LVH.  . Mitral stenosis    a. 10/2019 Echo: EF 65-70%, mod LVH. No rwma. Mildly dil LA. MV Ca2+ w/ mod-sev MS (mean grad 40mmHg, Valve area 1.1-1.2). Mild to mod AoV sclerosis w/o stenosis.  . Myocardial infarction ([place])   . Sleep apnea   . Syncope and collapse    a. Early 03/2014->did not seek medical attn.  . Tobacco abuse     PAST SURGICAL HISTORY :   Past Surgical History:  Procedure Laterality Date  . BREAST CYST ASPIRATION Right 2005   NEG  . CARDIAC CATHETERIZATION  06/2005   [facility]  . CARDIAC CATHETERIZATION  04/2014   [facility]  . COLONOSCOPY WITH PROPOFOL N/A 04/27/2016   Procedure: COLONOSCOPY WITH PROPOFOL;  Surgeon: Manya Silvas, MD;  Location: [facility] ENDOSCOPY;  Service: Endoscopy;  Laterality: N/A;  . COLONOSCOPY WITH PROPOFOL N/A 04/06/2020   Procedure: COLONOSCOPY WITH PROPOFOL;  Surgeon: Toledo, Benay Pike, MD;  Location: [facility] ENDOSCOPY;  Service: Gastroenterology;  Laterality: N/A;  . CORONARY ANGIOPLASTY    . DILATATION & CURETTAGE/HYSTEROSCOPY WITH MYOSURE N/A 09/18/2015   Procedure: DILATATION & CURETTAGE/HYSTEROSCOPY WITH MYOSURE;  Surgeon: Boykin Nearing, MD;  Location: [facility] ORS;  Service: Gynecology;  Laterality: N/A;  . ESOPHAGOGASTRODUODENOSCOPY (EGD) WITH PROPOFOL N/A 04/27/2016   Procedure: ESOPHAGOGASTRODUODENOSCOPY (EGD) WITH PROPOFOL;  Surgeon: Manya Silvas, MD;  Location:  [facility] ENDOSCOPY;  Service: Endoscopy;  Laterality: N/A;  . NECK SURGERY     Degenerative Disk Disease and removal of a spinal cyst  . TEE WITHOUT CARDIOVERSION N/A 12/30/2019   Procedure: TRANSESOPHAGEAL ECHOCARDIOGRAM (TEE);  Surgeon: Wellington Hampshire, MD;  Location: [facility] ORS;  Service: Cardiovascular;  Laterality: N/A;  . TUBAL LIGATION      FAMILY HISTORY :   Family History  Problem Relation Age of Onset  . Heart disease Mother   . Heart attack Mother   . Hypertension Mother   . Hyperlipidemia Mother   . Breast cancer Sister     SOCIAL HISTORY:   Social History   Tobacco Use  . Smoking status: Current Every Day Smoker    Packs/day: 0.25    Years: 40.00    Pack years: 10.00    Types: Cigarettes  . Smokeless tobacco: Never Used  Vaping Use  . Vaping Use: Never used  Substance Use Topics  . Alcohol use: No  . Drug use: Yes    Types: Marijuana    ALLERGIES:  is allergic to aspirin and hydrochlorothiazide.  MEDICATIONS:  Current Outpatient Medications  Medication Sig Dispense Refill  . allopurinol (ZYLOPRIM) 100 MG tablet Take 100 mg by mouth 2 (two) times daily.    Marland Kitchen amLODipine (NORVASC) 10 MG tablet Take 1 tablet (10 mg total) by mouth daily. (Patient taking differently: Take 10 mg by mouth at bedtime. ) 90 tablet 1  . Calcium Carbonate-Vitamin D (CALCIUM 600+D) 600-400 MG-UNIT tablet Take 1 tablet by mouth daily.    . carvedilol (COREG) 12.5 MG tablet Take 1 tablet (12.5 mg total) by mouth 2 (two) times daily. 180 tablet 0  . cetirizine (ZYRTEC) 10 MG tablet Take 10 mg by mouth at bedtime.     . cholecalciferol (VITAMIN D) 25 MCG (1000 UT) tablet Take 1,000 Units by mouth daily.    . colchicine 0.6 MG tablet Take 0.6 mg by mouth daily as needed (gout flare).     . ferrous sulfate 325 (65 FE) MG tablet Take 325 mg by mouth every other day.    . fluticasone (FLONASE) 50 MCG/ACT nasal spray Place 1 spray into both nostrils 2 (two) times daily as needed (nasal  congestion.).     Marland Kitchen fluticasone (VERAMYST) 27.5 MCG/SPRAY nasal spray Place 2 sprays into the nose daily. 10 g 0  . GLUCOSAMINE HCL PO Take 1 tablet by mouth daily.    Marland Kitchen HUMALOG KWIKPEN 100 UNIT/ML KwikPen Inject 8-28 Units into the skin See admin instructions. Inject 8 units subcutaneously in the morning, 28 units with lunch & 28 units with supper    . LANTUS SOLOSTAR 100 UNIT/ML Solostar Pen Inject 40-44 Units into the skin See admin instructions. Inject 40 units subcutaneously in the morning & inject 44 units subcutaneously at night.    . linaclotide (LINZESS) 72 MCG capsule Take 72 mcg by mouth daily.     . magnesium oxide (MAG-OX) 400 MG tablet Take 400 mg by mouth 3 (three) times daily.     . Olopatadine HCl (PATADAY) 0.2 % SOLN Place  1 drop into both eyes 2 (two) times daily as needed (allergy eyes.).    Marland Kitchen Omega-3 Fatty Acids (FISH OIL MAXIMUM STRENGTH) 1200 MG CAPS Take 1,200 mg by mouth 3 (three) times daily.   0  . pantoprazole (PROTONIX) 20 MG tablet Take 20 mg by mouth 2 (two) times daily.   0  . patiromer (VELTASSA) 8.4 g packet Take 4.2 g by mouth in the morning and at bedtime.     . rosuvastatin (CRESTOR) 40 MG tablet Take 1 tablet (40 mg total) by mouth daily. (Patient taking differently: Take 40 mg by mouth at bedtime. ) 90 tablet 3  . sodium bicarbonate 650 MG tablet Take 1,300 mg by mouth 2 (two) times daily.     . SYMBICORT 160-4.5 MCG/ACT inhaler Inhale 2 puffs into the lungs 2 (two) times daily.    Marland Kitchen tiZANidine (ZANAFLEX) 4 MG tablet Take 4 mg by mouth 3 (three) times daily as needed for muscle spasms.    Marland Kitchen triamcinolone cream (KENALOG) 0.1 % Apply 1 application topically 2 (two) times daily as needed (skin rash/irritation).      No current facility-administered medications for this visit.    PHYSICAL EXAMINATION:   BP (!) 147/71 (BP Location: Left Arm, Patient Position: Sitting, Cuff Size: Normal)   Pulse 78   Temp 97.8 F (36.6 C) (Tympanic)   Wt 162 lb 8 oz (73.7  kg)   BMI 28.79 kg/m   Filed Weights   05/21/20 1048  Weight: 162 lb 8 oz (73.7 kg)    Physical Exam HENT:     Head: Normocephalic and atraumatic.     Mouth/Throat:     Pharynx: No oropharyngeal exudate.  Eyes:     Pupils: Pupils are equal, round, and reactive to light.  Cardiovascular:     Rate and Rhythm: Normal rate and regular rhythm.  Pulmonary:     Effort: No respiratory distress.     Breath sounds: No wheezing.  Abdominal:     General: Bowel sounds are normal. There is no distension.     Palpations: Abdomen is soft. There is no mass.     Tenderness: There is no abdominal tenderness. There is no guarding or rebound.  Musculoskeletal:        General: No tenderness. Normal range of motion.     Cervical back: Normal range of motion and neck supple.  Skin:    General: Skin is warm.  Neurological:     Mental Status: She is alert and oriented to person, place, and time.  Psychiatric:        Mood and Affect: Affect normal.     LABORATORY DATA:  I have reviewed the data as listed    Component Value Date/Time   NA 143 05/20/2020 1038   NA 139 10/21/2014 1332   K 5.3 (H) 05/20/2020 1038   K 4.4 10/21/2014 1332   CL 111 05/20/2020 1038   CL 108 (H) 10/21/2014 1332   CO2 22 05/20/2020 1038   CO2 24 10/21/2014 1332   GLUCOSE 155 (H) 05/20/2020 1038   GLUCOSE 103 (H) 10/21/2014 1332   BUN 37 (H) 05/20/2020 1038   BUN 21 (H) 10/21/2014 1332   CREATININE 3.84 (H) 05/20/2020 1038   CREATININE 1.45 (H) 10/21/2014 1332   CALCIUM 8.0 (L) 05/20/2020 1038   CALCIUM 9.1 10/21/2014 1332   PROT 7.1 01/24/2020 1305   PROT 7.5 10/21/2014 1332   ALBUMIN 3.8 01/24/2020 1305   ALBUMIN 3.6 10/21/2014 1332  AST 17 01/24/2020 1305   AST 15 10/21/2014 1332   ALT 23 01/24/2020 1305   ALT 27 10/21/2014 1332   ALKPHOS 86 01/24/2020 1305   ALKPHOS 94 10/21/2014 1332   BILITOT 0.3 01/24/2020 1305   BILITOT 0.2 10/21/2014 1332   GFRNONAA 11 (L) 05/20/2020 1038   GFRNONAA 39 (L)  10/21/2014 1332   GFRNONAA 27 (L) 05/10/2014 0031   GFRAA 13 (L) 05/20/2020 1038   GFRAA 47 (L) 10/21/2014 1332   GFRAA 32 (L) 05/10/2014 0031    No results found for: SPEP, UPEP  Lab Results  Component Value Date   WBC 6.2 05/20/2020   NEUTROABS 3.9 05/20/2020   HGB 9.5 (L) 05/20/2020   HCT 29.3 (L) 05/20/2020   MCV 86.9 05/20/2020   PLT 332 05/20/2020      Chemistry      Component Value Date/Time   NA 143 05/20/2020 1038   NA 139 10/21/2014 1332   K 5.3 (H) 05/20/2020 1038   K 4.4 10/21/2014 1332   CL 111 05/20/2020 1038   CL 108 (H) 10/21/2014 1332   CO2 22 05/20/2020 1038   CO2 24 10/21/2014 1332   BUN 37 (H) 05/20/2020 1038   BUN 21 (H) 10/21/2014 1332   CREATININE 3.84 (H) 05/20/2020 1038   CREATININE 1.45 (H) 10/21/2014 1332      Component Value Date/Time   CALCIUM 8.0 (L) 05/20/2020 1038   CALCIUM 9.1 10/21/2014 1332   ALKPHOS 86 01/24/2020 1305   ALKPHOS 94 10/21/2014 1332   AST 17 01/24/2020 1305   AST 15 10/21/2014 1332   ALT 23 01/24/2020 1305   ALT 27 10/21/2014 1332   BILITOT 0.3 01/24/2020 1305   BILITOT 0.2 10/21/2014 1332       ASSESSMENT & PLAN:   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  Stable; S/p Ferrahem. Proceed with retacrt today.  # Mild hyperkalemia.  Potassium 5.3. on  Veltessa; Recommend close monitoring with nephrology.  # CKD-stage IV creatinine-GFR-12 slow worsening; awaiting fistula placement;   # DISPOSITION: # proceed with Retacrit today # 1 month- cbc/bmp- possible retacrit # Follow-up 2 months/MD-labs CBC BMP- possible retacrit- Dr.B  Cc; Dr.Swain      Cammie Sickle, MD 05/21/2020 11:58 AM

## 2020-05-22 ENCOUNTER — Ambulatory Visit: Payer: Medicare Other | Admitting: Urgent Care

## 2020-05-22 ENCOUNTER — Encounter: Payer: Self-pay | Admitting: Vascular Surgery

## 2020-05-22 ENCOUNTER — Ambulatory Visit: Payer: Medicare Other

## 2020-05-22 ENCOUNTER — Other Ambulatory Visit: Payer: Self-pay

## 2020-05-22 ENCOUNTER — Encounter
Admission: RE | Disposition: A | Discharge status: Home / Self Care | Payer: Self-pay | Source: Ambulatory Visit | Attending: Vascular Surgery

## 2020-05-22 ENCOUNTER — Ambulatory Visit
Admission: RE | Admit: 2020-05-22 | Discharge: 2020-05-22 | Disposition: A | Discharge status: Home / Self Care | Payer: Medicare Other | Source: Ambulatory Visit | Attending: Vascular Surgery | Admitting: Vascular Surgery

## 2020-05-22 DIAGNOSIS — I1310 Hypertensive heart and chronic kidney disease without heart failure, with stage 1 through stage 4 chronic kidney disease, or unspecified chronic kidney disease: Secondary | ICD-10-CM | POA: Insufficient documentation

## 2020-05-22 DIAGNOSIS — K219 Gastro-esophageal reflux disease without esophagitis: Secondary | ICD-10-CM | POA: Insufficient documentation

## 2020-05-22 DIAGNOSIS — I252 Old myocardial infarction: Secondary | ICD-10-CM | POA: Diagnosis not present

## 2020-05-22 DIAGNOSIS — Z7951 Long term (current) use of inhaled steroids: Secondary | ICD-10-CM | POA: Diagnosis not present

## 2020-05-22 DIAGNOSIS — M1990 Unspecified osteoarthritis, unspecified site: Secondary | ICD-10-CM | POA: Insufficient documentation

## 2020-05-22 DIAGNOSIS — Z8249 Family history of ischemic heart disease and other diseases of the circulatory system: Secondary | ICD-10-CM | POA: Insufficient documentation

## 2020-05-22 DIAGNOSIS — Z79899 Other long term (current) drug therapy: Secondary | ICD-10-CM | POA: Diagnosis not present

## 2020-05-22 DIAGNOSIS — E785 Hyperlipidemia, unspecified: Secondary | ICD-10-CM | POA: Diagnosis not present

## 2020-05-22 DIAGNOSIS — E1140 Type 2 diabetes mellitus with diabetic neuropathy, unspecified: Secondary | ICD-10-CM | POA: Diagnosis not present

## 2020-05-22 DIAGNOSIS — F17210 Nicotine dependence, cigarettes, uncomplicated: Secondary | ICD-10-CM | POA: Insufficient documentation

## 2020-05-22 DIAGNOSIS — I2510 Atherosclerotic heart disease of native coronary artery without angina pectoris: Secondary | ICD-10-CM | POA: Diagnosis not present

## 2020-05-22 DIAGNOSIS — Z886 Allergy status to analgesic agent status: Secondary | ICD-10-CM | POA: Diagnosis not present

## 2020-05-22 DIAGNOSIS — D649 Anemia, unspecified: Secondary | ICD-10-CM | POA: Diagnosis not present

## 2020-05-22 DIAGNOSIS — Z794 Long term (current) use of insulin: Secondary | ICD-10-CM | POA: Diagnosis not present

## 2020-05-22 DIAGNOSIS — Z888 Allergy status to other drugs, medicaments and biological substances status: Secondary | ICD-10-CM | POA: Insufficient documentation

## 2020-05-22 DIAGNOSIS — N185 Chronic kidney disease, stage 5: Secondary | ICD-10-CM | POA: Diagnosis present

## 2020-05-22 DIAGNOSIS — Z419 Encounter for procedure for purposes other than remedying health state, unspecified: Secondary | ICD-10-CM

## 2020-05-22 DIAGNOSIS — M797 Fibromyalgia: Secondary | ICD-10-CM | POA: Diagnosis not present

## 2020-05-22 DIAGNOSIS — E1122 Type 2 diabetes mellitus with diabetic chronic kidney disease: Secondary | ICD-10-CM | POA: Diagnosis not present

## 2020-05-22 DIAGNOSIS — I050 Rheumatic mitral stenosis: Secondary | ICD-10-CM | POA: Diagnosis not present

## 2020-05-22 HISTORY — PX: AV FISTULA PLACEMENT: SHX1204

## 2020-05-22 LAB — GLUCOSE, CAPILLARY
Glucose-Capillary: 52 mg/dL — ABNORMAL LOW (ref 70–99)
Glucose-Capillary: 93 mg/dL (ref 70–99)
Glucose-Capillary: 73 mg/dL (ref 70–99)
Glucose-Capillary: 127 mg/dL — ABNORMAL HIGH (ref 70–99)
Glucose-Capillary: 64 mg/dL — ABNORMAL LOW (ref 70–99)

## 2020-05-22 SURGERY — ARTERIOVENOUS (AV) FISTULA CREATION
Anesthesia: Regional | Laterality: Right

## 2020-05-22 MED ORDER — HYDROCODONE-ACETAMINOPHEN 5-325 MG PO TABS: 1.0000 | ORAL_TABLET | Freq: Four times a day (QID) | ORAL | 0 refills | Status: DC | PRN

## 2020-05-22 MED ORDER — SODIUM CHLORIDE 0.9 % IV SOLN
INTRAVENOUS | Status: DC | PRN
  Administered 2020-05-22: 39 mL via INTRAMUSCULAR

## 2020-05-22 MED ORDER — DEXTROSE 50 % IV SOLN
INTRAVENOUS | Status: AC
  Filled 2020-05-22: qty 50

## 2020-05-22 MED ORDER — IPRATROPIUM-ALBUTEROL 0.5-2.5 (3) MG/3ML IN SOLN: 3.0000 mL | Freq: Once | RESPIRATORY_TRACT | Status: AC

## 2020-05-22 MED ORDER — BUPIVACAINE LIPOSOME 1.3 % IJ SUSP
INTRAMUSCULAR | Status: AC
  Filled 2020-05-22: qty 20

## 2020-05-22 MED ORDER — DEXAMETHASONE SODIUM PHOSPHATE 10 MG/ML IJ SOLN
INTRAMUSCULAR | Status: DC | PRN
  Administered 2020-05-22: 5 mg via INTRAVENOUS

## 2020-05-22 MED ORDER — EPHEDRINE SULFATE 50 MG/ML IJ SOLN
INTRAMUSCULAR | Status: DC | PRN
  Administered 2020-05-22 (×2): 10 mg via INTRAVENOUS

## 2020-05-22 MED ORDER — ACETAMINOPHEN 10 MG/ML IV SOLN: 1000.0000 mg | Freq: Once | INTRAVENOUS | Status: DC | PRN

## 2020-05-22 MED ORDER — HEPARIN SODIUM (PORCINE) 5000 UNIT/ML IJ SOLN
INTRAMUSCULAR | Status: AC
  Filled 2020-05-22: qty 1

## 2020-05-22 MED ORDER — DEXTROSE 50 % IV SOLN
25.0000 mL | Freq: Once | INTRAVENOUS | Status: AC
  Administered 2020-05-22: 25 mL via INTRAVENOUS

## 2020-05-22 MED ORDER — MIDAZOLAM HCL 2 MG/2ML IJ SOLN
INTRAMUSCULAR | Status: AC
  Administered 2020-05-22: 1 mg via INTRAVENOUS
  Filled 2020-05-22: qty 2

## 2020-05-22 MED ORDER — CEFAZOLIN SODIUM 1 G IJ SOLR
INTRAMUSCULAR | Status: AC
  Filled 2020-05-22: qty 10

## 2020-05-22 MED ORDER — HYDROCODONE-ACETAMINOPHEN 5-325 MG PO TABS: 1.0000 | ORAL_TABLET | Freq: Four times a day (QID) | ORAL | 0 refills | Status: AC | PRN

## 2020-05-22 MED ORDER — PHENYLEPHRINE HCL (PRESSORS) 10 MG/ML IV SOLN
INTRAVENOUS | Status: DC | PRN
  Administered 2020-05-22 (×3): 100 ug via INTRAVENOUS
  Administered 2020-05-22: 50 ug via INTRAVENOUS
  Administered 2020-05-22 (×5): 100 ug via INTRAVENOUS

## 2020-05-22 MED ORDER — BUPIVACAINE HCL (PF) 0.5 % IJ SOLN
INTRAMUSCULAR | Status: DC | PRN
  Administered 2020-05-22: 1.5 mL

## 2020-05-22 MED ORDER — LIDOCAINE HCL (PF) 2 % IJ SOLN
INTRAMUSCULAR | Status: DC | PRN
  Administered 2020-05-22: 200 mg

## 2020-05-22 MED ORDER — IPRATROPIUM-ALBUTEROL 0.5-2.5 (3) MG/3ML IN SOLN
RESPIRATORY_TRACT | Status: AC
  Administered 2020-05-22: 3 mL via RESPIRATORY_TRACT
  Filled 2020-05-22: qty 3

## 2020-05-22 MED ORDER — FENTANYL CITRATE (PF) 100 MCG/2ML IJ SOLN: 25.0000 ug | INTRAMUSCULAR | Status: DC | PRN

## 2020-05-22 MED ORDER — PROPOFOL 500 MG/50ML IV EMUL
INTRAVENOUS | Status: AC
  Filled 2020-05-22: qty 50

## 2020-05-22 MED ORDER — MIDAZOLAM HCL 2 MG/2ML IJ SOLN: 1.0000 mg | Freq: Once | INTRAMUSCULAR | Status: AC

## 2020-05-22 MED ORDER — PROPOFOL 500 MG/50ML IV EMUL
INTRAVENOUS | Status: DC | PRN
  Administered 2020-05-22: 75 ug/kg/min via INTRAVENOUS

## 2020-05-22 MED ORDER — BUPIVACAINE HCL (PF) 0.5 % IJ SOLN
INTRAMUSCULAR | Status: AC
  Filled 2020-05-22: qty 30

## 2020-05-22 MED ORDER — ONDANSETRON HCL 4 MG/2ML IJ SOLN
INTRAMUSCULAR | Status: DC | PRN
  Administered 2020-05-22: 4 mg via INTRAVENOUS

## 2020-05-22 MED ORDER — CEFAZOLIN SODIUM-DEXTROSE 2-3 GM-%(50ML) IV SOLR
INTRAVENOUS | Status: DC | PRN
  Administered 2020-05-22: 2 g via INTRAVENOUS

## 2020-05-22 MED ORDER — CHLORHEXIDINE GLUCONATE CLOTH 2 % EX PADS
6.0000 | MEDICATED_PAD | Freq: Once | CUTANEOUS | Status: AC
  Administered 2020-05-22: 6 via TOPICAL

## 2020-05-22 MED ORDER — PROPOFOL 10 MG/ML IV BOLUS
INTRAVENOUS | Status: AC
  Filled 2020-05-22: qty 20

## 2020-05-22 MED ORDER — CEFAZOLIN SODIUM-DEXTROSE 1-4 GM/50ML-% IV SOLN
INTRAVENOUS | Status: AC
  Filled 2020-05-22: qty 50

## 2020-05-22 MED ORDER — SODIUM CHLORIDE 0.9 % IV SOLN
INTRAVENOUS | Status: DC | PRN
  Administered 2020-05-22: 50 ug/min via INTRAVENOUS

## 2020-05-22 MED ORDER — SODIUM CHLORIDE 0.9 % IV SOLN: INTRAVENOUS | Status: DC

## 2020-05-22 MED ORDER — ORAL CARE MOUTH RINSE: 15.0000 mL | Freq: Once | OROMUCOSAL | Status: AC

## 2020-05-22 MED ORDER — FENTANYL CITRATE (PF) 100 MCG/2ML IJ SOLN
INTRAMUSCULAR | Status: DC | PRN
  Administered 2020-05-22 (×2): 25 ug via INTRAVENOUS

## 2020-05-22 MED ORDER — FENTANYL CITRATE (PF) 100 MCG/2ML IJ SOLN
INTRAMUSCULAR | Status: AC
  Administered 2020-05-22: 50 ug via INTRAVENOUS
  Filled 2020-05-22: qty 2

## 2020-05-22 MED ORDER — ROPIVACAINE HCL 5 MG/ML IJ SOLN
INTRAMUSCULAR | Status: AC
  Filled 2020-05-22: qty 30

## 2020-05-22 MED ORDER — OXYCODONE HCL 5 MG PO TABS: 5.0000 mg | ORAL_TABLET | Freq: Once | ORAL | Status: DC | PRN

## 2020-05-22 MED ORDER — DEXTROSE 50 % IV SOLN
INTRAVENOUS | Status: AC
  Administered 2020-05-22: 25 mL via INTRAVENOUS
  Filled 2020-05-22: qty 50

## 2020-05-22 MED ORDER — CEFAZOLIN SODIUM-DEXTROSE 1-4 GM/50ML-% IV SOLN: 1.0000 g | INTRAVENOUS | Status: DC

## 2020-05-22 MED ORDER — DEXMEDETOMIDINE HCL IN NACL 80 MCG/20ML IV SOLN
INTRAVENOUS | Status: AC
  Filled 2020-05-22: qty 20

## 2020-05-22 MED ORDER — ROPIVACAINE HCL 5 MG/ML IJ SOLN
INTRAMUSCULAR | Status: DC | PRN
Start: 2020-05-22 — End: 2020-05-22
  Administered 2020-05-22: 15 mL

## 2020-05-22 MED ORDER — BUPIVACAINE LIPOSOME 1.3 % IJ SUSP
INTRAMUSCULAR | Status: DC | PRN
  Administered 2020-05-22: 1.5 mL

## 2020-05-22 MED ORDER — FENTANYL CITRATE (PF) 100 MCG/2ML IJ SOLN
INTRAMUSCULAR | Status: AC
  Filled 2020-05-22: qty 2

## 2020-05-22 MED ORDER — OXYCODONE HCL 5 MG/5ML PO SOLN: 5.0000 mg | Freq: Once | ORAL | Status: DC | PRN

## 2020-05-22 MED ORDER — SODIUM CHLORIDE FLUSH 0.9 % IV SOLN
INTRAVENOUS | Status: AC
  Filled 2020-05-22: qty 10

## 2020-05-22 MED ORDER — ALBUTEROL SULFATE HFA 108 (90 BASE) MCG/ACT IN AERS
INHALATION_SPRAY | RESPIRATORY_TRACT | Status: DC | PRN
  Administered 2020-05-22: 4 via RESPIRATORY_TRACT

## 2020-05-22 MED ORDER — DEXMEDETOMIDINE HCL IN NACL 200 MCG/50ML IV SOLN
INTRAVENOUS | Status: DC | PRN
Start: 2020-05-22 — End: 2020-05-22
  Administered 2020-05-22: 12 ug via INTRAVENOUS
  Administered 2020-05-22: 8 ug via INTRAVENOUS

## 2020-05-22 MED ORDER — ONDANSETRON HCL 4 MG/2ML IJ SOLN: 4.0000 mg | Freq: Once | INTRAMUSCULAR | Status: DC | PRN

## 2020-05-22 MED ORDER — CHLORHEXIDINE GLUCONATE 0.12 % MT SOLN
15.0000 mL | Freq: Once | OROMUCOSAL | Status: AC
  Administered 2020-05-22: 15 mL via OROMUCOSAL

## 2020-05-22 MED ORDER — FENTANYL CITRATE (PF) 100 MCG/2ML IJ SOLN: 50.0000 ug | Freq: Once | INTRAMUSCULAR | Status: AC

## 2020-05-22 MED ORDER — CHLORHEXIDINE GLUCONATE 0.12 % MT SOLN
OROMUCOSAL | Status: AC
  Filled 2020-05-22: qty 15

## 2020-05-22 MED ORDER — DEXTROSE 50 % IV SOLN: 25.0000 mL | Freq: Once | INTRAVENOUS | Status: AC

## 2020-05-22 SURGICAL SUPPLY — 59 items
APPLIER CLIP 11 MED OPEN (CLIP)
APPLIER CLIP 9.375 SM OPEN (CLIP)
BAG DECANTER FOR FLEXI CONT (MISCELLANEOUS) ×2 IMPLANT
BLADE SURG SZ11 CARB STEEL (BLADE) ×2 IMPLANT
BOOT SUTURE AID YELLOW STND (SUTURE) ×2 IMPLANT
BRUSH SCRUB EZ  4% CHG (MISCELLANEOUS) ×1
BRUSH SCRUB EZ 4% CHG (MISCELLANEOUS) ×1 IMPLANT
CANISTER SUCT 1200ML W/VALVE (MISCELLANEOUS) ×2 IMPLANT
CHLORAPREP W/TINT 26 (MISCELLANEOUS) ×3 IMPLANT
CLIP APPLIE 11 MED OPEN (CLIP) IMPLANT
CLIP APPLIE 9.375 SM OPEN (CLIP) IMPLANT
COVER WAND RF STERILE (DRAPES) ×2 IMPLANT
DERMABOND ADVANCED (GAUZE/BANDAGES/DRESSINGS) ×1
DERMABOND ADVANCED .7 DNX12 (GAUZE/BANDAGES/DRESSINGS) ×1 IMPLANT
DRESSING SURGICEL FIBRLLR 1X2 (HEMOSTASIS) ×1 IMPLANT
DRSG SURGICEL FIBRILLAR 1X2 (HEMOSTASIS)
ELECT CAUTERY BLADE 6.4 (BLADE) ×2 IMPLANT
ELECT REM PT RETURN 9FT ADLT (ELECTROSURGICAL) ×2
ELECTRODE REM PT RTRN 9FT ADLT (ELECTROSURGICAL) ×1 IMPLANT
GEL ULTRASOUND 20GR AQUASONIC (MISCELLANEOUS) IMPLANT
GLOVE BIO SURGEON STRL SZ7 (GLOVE) ×4 IMPLANT
GLOVE INDICATOR 7.5 STRL GRN (GLOVE) ×4 IMPLANT
GLOVE SURG SYN 8.0 (GLOVE) ×2 IMPLANT
GLOVE SURG SYN 8.0 PF PI (GLOVE) ×1 IMPLANT
GOWN STRL REUS W/ TWL LRG LVL3 (GOWN DISPOSABLE) ×2 IMPLANT
GOWN STRL REUS W/ TWL XL LVL3 (GOWN DISPOSABLE) ×1 IMPLANT
GOWN STRL REUS W/TWL LRG LVL3 (GOWN DISPOSABLE) ×3
GOWN STRL REUS W/TWL XL LVL3 (GOWN DISPOSABLE) ×1
IV NS 500ML (IV SOLUTION) ×1
IV NS 500ML BAXH (IV SOLUTION) ×1 IMPLANT
KIT TURNOVER KIT A (KITS) ×2 IMPLANT
LABEL OR SOLS (LABEL) ×2 IMPLANT
LOOP RED MAXI  1X406MM (MISCELLANEOUS) ×1
LOOP VESSEL MAXI 1X406 RED (MISCELLANEOUS) ×1 IMPLANT
LOOP VESSEL MINI 0.8X406 BLUE (MISCELLANEOUS) ×2 IMPLANT
LOOPS BLUE MINI 0.8X406MM (MISCELLANEOUS) ×1
NDL FILTER BLUNT 18X1 1/2 (NEEDLE) ×1 IMPLANT
NDL HYPO 30X.5 LL (NEEDLE) IMPLANT
NEEDLE FILTER BLUNT 18X 1/2SAF (NEEDLE)
NEEDLE FILTER BLUNT 18X1 1/2 (NEEDLE) IMPLANT
NEEDLE HYPO 30X.5 LL (NEEDLE) IMPLANT
PACK EXTREMITY (MISCELLANEOUS) ×2 IMPLANT
PAD PREP 24X41 OB/GYN DISP (PERSONAL CARE ITEMS) ×2 IMPLANT
SLING ARM M TX990204 (SOFTGOODS) ×1 IMPLANT
STOCKINETTE 48X4 2 PLY STRL (GAUZE/BANDAGES/DRESSINGS) ×1 IMPLANT
STOCKINETTE STRL 4IN 9604848 (GAUZE/BANDAGES/DRESSINGS) ×2 IMPLANT
SUT MNCRL+ 5-0 UNDYED PC-3 (SUTURE) ×1 IMPLANT
SUT MONOCRYL 5-0 (SUTURE) ×1
SUT PROLENE 6 0 BV (SUTURE) ×7 IMPLANT
SUT SILK 2 0 (SUTURE) ×1
SUT SILK 2-0 18XBRD TIE 12 (SUTURE) ×1 IMPLANT
SUT SILK 3 0 (SUTURE) ×1
SUT SILK 3-0 18XBRD TIE 12 (SUTURE) ×1 IMPLANT
SUT SILK 4 0 (SUTURE) ×1
SUT SILK 4-0 18XBRD TIE 12 (SUTURE) ×1 IMPLANT
SUT VIC AB 3-0 SH 27 (SUTURE) ×1
SUT VIC AB 3-0 SH 27X BRD (SUTURE) ×1 IMPLANT
SYR 20ML LL LF (SYRINGE) ×2 IMPLANT
SYR 3ML LL SCALE MARK (SYRINGE) ×2 IMPLANT

## 2020-05-22 NOTE — Transfer of Care (Signed)
Immediate Anesthesia Transfer of Care Note  Patient: Caitlin Bass  Procedure(s) Performed: ARTERIOVENOUS (AV) FISTULA CREATION (WRIST ) (Right )  Patient Location: PACU  Anesthesia Type:General  Level of Consciousness: awake and alert   Airway & Oxygen Therapy: Patient connected to face mask oxygen  Post-op Assessment: Report given to RN and Post -op Vital signs reviewed and stable  Post vital signs: Reviewed and stable  Last Vitals:  Vitals Value Taken Time  BP 142/71 05/22/20 1124  Temp 36.5 C 05/22/20 1124  Pulse 73 05/22/20 1124  Resp 24 05/22/20 1124  SpO2 93 % 05/22/20 1124  Vitals shown include unvalidated device data.  Last Pain:  Vitals:   05/22/20 0832  TempSrc: Temporal  PainSc: 0-No pain      Patients Stated Pain Goal: 0 (36/46/80 3212)  Complications: No complications documented.

## 2020-05-22 NOTE — Anesthesia Postprocedure Evaluation (Signed)
Anesthesia Post Note  Patient: Caitlin Bass  Procedure(s) Performed: ARTERIOVENOUS (AV) FISTULA CREATION (WRIST ) (Right )  Patient location during evaluation: PACU Anesthesia Type: Regional Level of consciousness: awake and alert Pain management: pain level controlled Vital Signs Assessment: post-procedure vital signs reviewed and stable Respiratory status: spontaneous breathing, nonlabored ventilation, respiratory function stable and patient connected to nasal cannula oxygen Cardiovascular status: blood pressure returned to baseline and stable Postop Assessment: no apparent nausea or vomiting Anesthetic complications: no   No complications documented.   Last Vitals:  Vitals:   05/22/20 1133 05/22/20 1140  BP: 124/68   Pulse: 73 73  Resp: (!) 24 (!) 25  Temp:    SpO2: 93% (!) 88%    Last Pain:  Vitals:   05/22/20 1133  TempSrc:   PainSc: 0-No pain                 Arita Miss

## 2020-05-22 NOTE — H&P (Signed)
[facility] History & Physical Update  The patient was interviewed and re-examined.  The patient's previous History and Physical has been reviewed and is unchanged.  There is no change in the plan of care. We plan to proceed with the scheduled procedure.  Hortencia Pilar, MD  05/22/2020, 8:33 AM

## 2020-05-22 NOTE — Anesthesia Procedure Notes (Addendum)
Procedure Name: LMA Insertion Date/Time: 05/22/2020 10:18 AM Performed by: Louann Sjogren, CRNA Pre-anesthesia Checklist: Patient identified, Patient being monitored, Timeout performed, Emergency Drugs available and Suction available Patient Re-evaluated:Patient Re-evaluated prior to induction Oxygen Delivery Method: Circle system utilized Preoxygenation: Pre-oxygenation with 100% oxygen Induction Type: IV induction Ventilation: Mask ventilation without difficulty LMA: LMA inserted LMA Size: 4.5 Tube type: Oral Number of attempts: 2 Placement Confirmation: positive ETCO2 and breath sounds checked- equal and bilateral Tube secured with: Tape Dental Injury: Teeth and Oropharynx as per pre-operative assessment  Comments: Placed by MD LMA4 placed first with poor seal. Removed and placed LMA 4.5 with better volumes and seal. Eyes taped. Atraumatic

## 2020-05-22 NOTE — Anesthesia Procedure Notes (Signed)
Anesthesia Regional Block: Supraclavicular block   Pre-Anesthetic Checklist: ,, timeout performed, Correct Patient, Correct Site, Correct Laterality, Correct Procedure, Correct Position, site marked, Risks and benefits discussed,  Surgical consent,  Pre-op evaluation,  At surgeon's request and post-op pain management  Laterality: Right  Prep: chloraprep       Needles:  Injection technique: Single-shot  Needle Type: Echogenic Needle     Needle Length: 4cm  Needle Gauge: 25     Additional Needles:   Narrative:  Start time: 05/22/2020 8:43 AM End time: 05/22/2020 8:45 AM Injection made incrementally with aspirations every 5 mL.  Performed by: Personally  Anesthesiologist: Arita Miss, MD  Additional Notes: Patient consented for risk and benefits of nerve block including but not limited to: nerve damage, failed block, bleeding and infection, hemidiaphragmatic paralysis and shortness of breath, Horner's syndrome.  Patient voiced understanding.  Functioning IV was confirmed and monitors were applied.  Sterile prep,hand hygiene and sterile gloves were used.  Minimal sedation used for procedure.   No paresthesia endorsed by patient during the procedure.  Negative aspiration and negative test dose prior to incremental administration of local anesthetic. The patient tolerated the procedure well with no immediate complications.

## 2020-05-22 NOTE — Op Note (Signed)
OPERATIVE NOTE   PROCEDURE: right radiocephalic arteriovenous fistula placement  PRE-OPERATIVE DIAGNOSIS: End Stage Renal Disease  POST-OPERATIVE DIAGNOSIS: End Stage Renal Disease  SURGEON: Hortencia Pilar  ASSISTANT(S): Ms. Hezzie Bump  ANESTHESIA: general  ESTIMATED BLOOD LOSS: <50 cc  FINDING(S): 4 mm cephalic vein at the level of the wrist  SPECIMEN(S):  none  INDICATIONS:   Caitlin Bass is a 69 y.o. female who presents with end stage renal disease.  The patient is scheduled for right brachiocephalic arteriovenous fistula placement.  The patient is aware the risks include but are not limited to: bleeding, infection, steal syndrome, nerve damage, ischemic monomelic neuropathy, failure to mature, and need for additional procedures.  The patient is aware of the risks of the procedure and elects to proceed forward.  DESCRIPTION: After full informed written consent was obtained from the patient, the patient was brought back to the operating room and placed supine upon the operating table.  Prior to induction, the patient received IV antibiotics.   After obtaining adequate anesthesia, the patient was then prepped and draped in the standard fashion for a right arm access procedure.   A first assistant was required to provide a safe and appropriate environment for executing the surgery.  The assistant was integral in providing retraction, exposure, running suture providing suction and in the closing process.   A linear incision was then created midway between the radial impulse and the cephalic vein. The cephalic vein was then identified and dissected circumferentially. It was marked with a surgical marker.    Attention was then turned to the radial artery which was exposed through the same incision and looped proximally and distally. Side branches were controlled with 4-0 silk ties.  The distal segment of the vein was ligated with a  2-0 silk, and the vein was  transected.  The proximal segment was interrogated with serial dilators.  The vein accepted up to a 4 mm dilator without any difficulty. Heparinized saline was infused into the vein and clamped it with a small bulldog.  At this point, I reset my exposure of the brachial artery and controlled the artery with vessel loops proximally and distally.  An arteriotomy was then made with a #11 blade, and extended with a Potts scissor.  Heparinized saline was injected proximal and distal into the radial artery.  The vein was then approximated to the artery while the artery was in its native bed and subsequently the vein was beveled using Potts scissors. The vein was then sewn to the artery in an end-to-side configuration with a running stitch of 6-0 Prolene.  Prior to completing this anastomosis Flushing maneuvers were performed and the artery was allowed to forward and back bleed.  There was no evidence of clot from any vessels.  I completed the anastomosis in the usual fashion and then released all vessel loops and clamps.    There was good  thrill in the venous outflow, and there was 1+ palpable radial pulse.  At this point, I irrigated out the surgical wound.  There was no further active bleeding.  The subcutaneous tissue was reapproximated with a running stitch of 3-0 Vicryl.  The skin was then reapproximated with a running subcuticular stitch of 4-0 Vicryl.  The skin was then cleaned, dried, and reinforced with Dermabond.    The patient tolerated this procedure well.   COMPLICATIONS: None  CONDITION: Caitlin Bass [facility]  Office: [phone]   05/22/2020, 11:13  AM

## 2020-06-04 ENCOUNTER — Other Ambulatory Visit (INDEPENDENT_AMBULATORY_CARE_PROVIDER_SITE_OTHER): Payer: Self-pay | Admitting: Vascular Surgery

## 2020-06-04 DIAGNOSIS — Z9889 Other specified postprocedural states: Secondary | ICD-10-CM

## 2020-06-04 DIAGNOSIS — N186 End stage renal disease: Secondary | ICD-10-CM

## 2020-06-11 ENCOUNTER — Encounter (INDEPENDENT_AMBULATORY_CARE_PROVIDER_SITE_OTHER): Payer: Self-pay | Admitting: Nurse Practitioner

## 2020-06-11 ENCOUNTER — Ambulatory Visit (INDEPENDENT_AMBULATORY_CARE_PROVIDER_SITE_OTHER): Payer: Medicare Other

## 2020-06-11 ENCOUNTER — Other Ambulatory Visit: Payer: Self-pay

## 2020-06-11 ENCOUNTER — Ambulatory Visit (INDEPENDENT_AMBULATORY_CARE_PROVIDER_SITE_OTHER): Payer: Medicare Other | Admitting: Vascular Surgery

## 2020-06-11 ENCOUNTER — Encounter (INDEPENDENT_AMBULATORY_CARE_PROVIDER_SITE_OTHER): Payer: Medicare Other

## 2020-06-11 ENCOUNTER — Ambulatory Visit (INDEPENDENT_AMBULATORY_CARE_PROVIDER_SITE_OTHER): Payer: Medicare Other | Admitting: Nurse Practitioner

## 2020-06-11 VITALS — BP 121/64 | HR 80 | Resp 16 | Wt 161.0 lb

## 2020-06-11 DIAGNOSIS — E119 Type 2 diabetes mellitus without complications: Secondary | ICD-10-CM

## 2020-06-11 DIAGNOSIS — N184 Chronic kidney disease, stage 4 (severe): Secondary | ICD-10-CM

## 2020-06-11 DIAGNOSIS — N186 End stage renal disease: Secondary | ICD-10-CM | POA: Diagnosis not present

## 2020-06-11 DIAGNOSIS — Z9889 Other specified postprocedural states: Secondary | ICD-10-CM

## 2020-06-11 DIAGNOSIS — I10 Essential (primary) hypertension: Secondary | ICD-10-CM

## 2020-06-12 ENCOUNTER — Other Ambulatory Visit: Payer: Self-pay | Admitting: Cardiovascular Disease

## 2020-06-12 DIAGNOSIS — E782 Mixed hyperlipidemia: Secondary | ICD-10-CM

## 2020-06-15 ENCOUNTER — Encounter (INDEPENDENT_AMBULATORY_CARE_PROVIDER_SITE_OTHER): Payer: Self-pay | Admitting: Nurse Practitioner

## 2020-06-15 NOTE — Progress Notes (Signed)
Subjective:    Patient ID: Caitlin Bass, female    DOB: 26-Sep-1951, 69 y.o.   MRN: [id number] Chief Complaint  Patient presents with  . Follow-up    [facility] 3wk post av fistula    The patient presents today after placement of a radiocephalic AV fistula on 11/28/7508.  The patient has not yet begun dialysis and according to the patient there is no set date yet.  She is maintaining regular follow-up with her nephrologist.  The patient reports doing well following fistula placement.  The patient does endorse having some numbness in her hand however this numbness was present prior to her surgery and it has not worsened since.  She denies feeling any coldness or pain in her hand either.  She denies any fever, chills, nausea, vomiting or diarrhea post surgery.  Overall the patient notes that she has done well post surgery.  Today noninvasive studies show flow volume of 1254.  The right radiocephalic AV fistula appears to be patent throughout.  There appears to be no evidence of stenosis or clot seen.   Review of Systems  Neurological: Positive for numbness (Hand numbness, present prior to fistula placement).  All other systems reviewed and are negative.      Objective:   Physical Exam Vitals reviewed.  HENT:     Head: Normocephalic.  Cardiovascular:     Rate and Rhythm: Normal rate and regular rhythm.     Pulses: Normal pulses.          Radial pulses are 2+ on the left side.     Arteriovenous access: right arteriovenous access is present.    Comments: Right radiocephalic AV fistula with good thrill and bruit Pulmonary:     Effort: Pulmonary effort is normal.     Breath sounds: Normal breath sounds.  Skin:    General: Skin is warm and dry.     Capillary Refill: Capillary refill takes less than 2 seconds.  Neurological:     Mental Status: She is alert and oriented to person, place, and time.  Psychiatric:        Mood and Affect: Mood normal.        Behavior: Behavior normal.          Thought Content: Thought content normal.        Judgment: Judgment normal.     BP 121/64 (BP Location: Left Arm)   Pulse 80   Resp 16   Wt 161 lb (73 kg)   BMI 28.52 kg/m   Past Medical History:  Diagnosis Date  . Anemia   . Anxiety   . Arthritis   . CKD (chronic kidney disease), stage IV ([place])   . Coronary artery disease    a. 03/2014 NSTEMI/Cath: LM nl, LAD 49m, 60d, D1 60, D2 nl, d3 40, LCX 100 w/ L->L collats, RCA non dom, min irregs, EF 55%-->Med Rx; b. 09/2019 MV: EF 30-44%, large basal inf, basal inflat, mid inf, mid inflat, apical defect - partially reversible.  . DDD (degenerative disc disease)    a. s/p upper back surgery spring 2015.  . Diabetes mellitus without complication ([place])   . Diverticulosis   . Dyspnea   . Dysrhythmia   . Fibromyalgia   . GERD (gastroesophageal reflux disease)   . Hiatal hernia   . Hyperlipidemia   . Hypertension   . Hypertensive heart disease    a. 03/2015 Echo: 55-60%, mild conc LVH; b. 10/2019 Echo: EF 65-70%, mod LVH.  Marland Kitchen  Mitral stenosis    a. 10/2019 Echo: EF 65-70%, mod LVH. No rwma. Mildly dil LA. MV Ca2+ w/ mod-sev MS (mean grad 56mmHg, Valve area 1.1-1.2). Mild to mod AoV sclerosis w/o stenosis.  . Myocardial infarction ([place])   . Sleep apnea   . Syncope and collapse    a. Early 03/2014->did not seek medical attn.  . Tobacco abuse     Social History   Socioeconomic History  . Marital status: Single    Spouse name: Not on file  . Number of children: Not on file  . Years of education: Not on file  . Highest education level: Not on file  Occupational History  . Not on file  Tobacco Use  . Smoking status: Current Every Day Smoker    Packs/day: 0.25    Years: 40.00    Pack years: 10.00    Types: Cigarettes  . Smokeless tobacco: Never Used  Vaping Use  . Vaping Use: Never used  Substance and Sexual Activity  . Alcohol use: No  . Drug use: Yes    Types: Marijuana  . Sexual activity: Not on file  Other Topics  Concern  . Not on file  Social History Narrative  . Not on file   Social Determinants of Health   Financial Resource Strain:   . Difficulty of Paying Living Expenses:   Food Insecurity:   . Worried About Charity fundraiser in the Last Year:   . Arboriculturist in the Last Year:   Transportation Needs:   . Film/video editor (Medical):   Marland Kitchen Lack of Transportation (Non-Medical):   Physical Activity:   . Days of Exercise per Week:   . Minutes of Exercise per Session:   Stress:   . Feeling of Stress :   Social Connections:   . Frequency of Communication with Friends and Family:   . Frequency of Social Gatherings with Friends and Family:   . Attends Religious Services:   . Active Member of Clubs or Organizations:   . Attends Archivist Meetings:   Marland Kitchen Marital Status:   Intimate Partner Violence:   . Fear of Current or Ex-Partner:   . Emotionally Abused:   Marland Kitchen Physically Abused:   . Sexually Abused:     Past Surgical History:  Procedure Laterality Date  . AV FISTULA PLACEMENT Right 05/22/2020   Procedure: ARTERIOVENOUS (AV) FISTULA CREATION (WRIST );  Surgeon: Katha Cabal, MD;  Location: [facility] ORS;  Service: Vascular;  Laterality: Right;  . BREAST CYST ASPIRATION Right 2005   NEG  . CARDIAC CATHETERIZATION  06/2005   [facility]  . CARDIAC CATHETERIZATION  04/2014   [facility]  . COLONOSCOPY WITH PROPOFOL N/A 04/27/2016   Procedure: COLONOSCOPY WITH PROPOFOL;  Surgeon: Manya Silvas, MD;  Location: [facility] ENDOSCOPY;  Service: Endoscopy;  Laterality: N/A;  . COLONOSCOPY WITH PROPOFOL N/A 04/06/2020   Procedure: COLONOSCOPY WITH PROPOFOL;  Surgeon: Toledo, Benay Pike, MD;  Location: [facility] ENDOSCOPY;  Service: Gastroenterology;  Laterality: N/A;  . CORONARY ANGIOPLASTY    . DILATATION & CURETTAGE/HYSTEROSCOPY WITH MYOSURE N/A 09/18/2015   Procedure: DILATATION & CURETTAGE/HYSTEROSCOPY WITH MYOSURE;  Surgeon: Boykin Nearing, MD;  Location: [facility] ORS;  Service: Gynecology;   Laterality: N/A;  . ESOPHAGOGASTRODUODENOSCOPY (EGD) WITH PROPOFOL N/A 04/27/2016   Procedure: ESOPHAGOGASTRODUODENOSCOPY (EGD) WITH PROPOFOL;  Surgeon: Manya Silvas, MD;  Location: [facility] ENDOSCOPY;  Service: Endoscopy;  Laterality: N/A;  . NECK SURGERY     Degenerative Disk Disease  and removal of a spinal cyst  . TEE WITHOUT CARDIOVERSION N/A 12/30/2019   Procedure: TRANSESOPHAGEAL ECHOCARDIOGRAM (TEE);  Surgeon: Wellington Hampshire, MD;  Location: [facility] ORS;  Service: Cardiovascular;  Laterality: N/A;  . TUBAL LIGATION      Family History  Problem Relation Age of Onset  . Heart disease Mother   . Heart attack Mother   . Hypertension Mother   . Hyperlipidemia Mother   . Breast cancer Sister     Allergies  Allergen Reactions  . Aspirin Other (See Comments)    "pt. has a history of ulcers" Stomach hurts  . Hydrochlorothiazide Itching       Assessment & Plan:   1. CKD (chronic kidney disease), stage IV (HCC) Today noninvasive studies show the patient's fistula has a good flow volume and is patent throughout.  However it is not quite mature.  We will have the patient return in 6 weeks to evaluate for maturity.  The patient is not quite yet on dialysis either.  If the patient's fistula is mature we can continue with routine follow-ups or sooner if the patient is found any dialysis soon.  2. Essential hypertension Continue antihypertensive medications as already ordered, these medications have been reviewed and there are no changes at this time.   3. Diabetes mellitus without complication ([place]) Continue hypoglycemic medications as already ordered, these medications have been reviewed and there are no changes at this time.  Hgb A1C to be monitored as already arranged by primary service    Current Outpatient Medications on File Prior to Visit  Medication Sig Dispense Refill  . allopurinol (ZYLOPRIM) 100 MG tablet Take 100 mg by mouth 2 (two) times daily.    Marland Kitchen amLODipine (NORVASC)  10 MG tablet Take 1 tablet (10 mg total) by mouth daily. (Patient taking differently: Take 10 mg by mouth at bedtime. ) 90 tablet 1  . Calcium Carbonate-Vitamin D (CALCIUM 600+D) 600-400 MG-UNIT tablet Take 1 tablet by mouth daily.    . carvedilol (COREG) 12.5 MG tablet Take 1 tablet (12.5 mg total) by mouth 2 (two) times daily. 180 tablet 0  . cetirizine (ZYRTEC) 10 MG tablet Take 10 mg by mouth at bedtime.     . cholecalciferol (VITAMIN D) 25 MCG (1000 UT) tablet Take 1,000 Units by mouth daily.    . colchicine 0.6 MG tablet Take 0.6 mg by mouth daily as needed (gout flare).     . ferrous sulfate 325 (65 FE) MG tablet Take 325 mg by mouth every other day.    . fluticasone (FLONASE) 50 MCG/ACT nasal spray Place 1 spray into both nostrils 2 (two) times daily as needed (nasal congestion.).     Marland Kitchen fluticasone (VERAMYST) 27.5 MCG/SPRAY nasal spray Place 2 sprays into the nose daily. 10 g 0  . GLUCOSAMINE HCL PO Take 1 tablet by mouth daily.    Marland Kitchen HUMALOG KWIKPEN 100 UNIT/ML KwikPen Inject 8-28 Units into the skin See admin instructions. Inject 8 units subcutaneously in the morning, 28 units with lunch & 28 units with supper    . HYDROcodone-acetaminophen (NORCO) 5-325 MG tablet Take 1-2 tablets by mouth every 6 (six) hours as needed for moderate pain or severe pain. 40 tablet 0  . LANTUS SOLOSTAR 100 UNIT/ML Solostar Pen Inject 40-44 Units into the skin See admin instructions. Inject 40 units subcutaneously in the morning & inject 44 units subcutaneously at night.    . linaclotide (LINZESS) 72 MCG capsule Take 72 mcg by mouth daily.     Marland Kitchen  magnesium oxide (MAG-OX) 400 MG tablet Take 400 mg by mouth 3 (three) times daily.     . Olopatadine HCl (PATADAY) 0.2 % SOLN Place 1 drop into both eyes 2 (two) times daily as needed (allergy eyes.).    Marland Kitchen Omega-3 Fatty Acids (FISH OIL MAXIMUM STRENGTH) 1200 MG CAPS Take 1,200 mg by mouth 3 (three) times daily.   0  . pantoprazole (PROTONIX) 20 MG tablet Take 20 mg by  mouth 2 (two) times daily.   0  . patiromer (VELTASSA) 8.4 g packet Take 4.2 g by mouth in the morning and at bedtime.     . sodium bicarbonate 650 MG tablet Take 1,300 mg by mouth 2 (two) times daily.     . SYMBICORT 160-4.5 MCG/ACT inhaler Inhale 2 puffs into the lungs 2 (two) times daily.    Marland Kitchen tiZANidine (ZANAFLEX) 4 MG tablet Take 4 mg by mouth 3 (three) times daily as needed for muscle spasms.    Marland Kitchen triamcinolone cream (KENALOG) 0.1 % Apply 1 application topically 2 (two) times daily as needed (skin rash/irritation).      No current facility-administered medications on file prior to visit.    There are no Patient Instructions on file for this visit. No follow-ups on file.   Kris Hartmann, NP

## 2020-06-18 ENCOUNTER — Inpatient Hospital Stay: Payer: Medicare Other | Attending: Internal Medicine

## 2020-06-18 ENCOUNTER — Inpatient Hospital Stay: Payer: Medicare Other

## 2020-06-28 DEATH — deceased

## 2020-06-30 ENCOUNTER — Other Ambulatory Visit: Payer: Medicare Other

## 2020-06-30 IMAGING — CT CT CHEST LUNG CANCER SCREENING LOW DOSE W/O CM
2 of 5 series · 15 of 40 positions shown, 18 images · non-contrast
Comparison: 09/27/2018.

CLINICAL DATA: Current smoker, 31 pack-year history.

EXAM:
CT CHEST WITHOUT CONTRAST LOW-DOSE FOR LUNG CANCER SCREENING
TECHNIQUE: Multidetector CT imaging of the chest was performed following the
standard protocol without IV contrast.

[Series 3: lung 1.00 · axial · 0.65mm/px · z∈[-1195,-899]mm · 12 of 328 slices shown, 15 images]
[im 16/328  mediastinal]
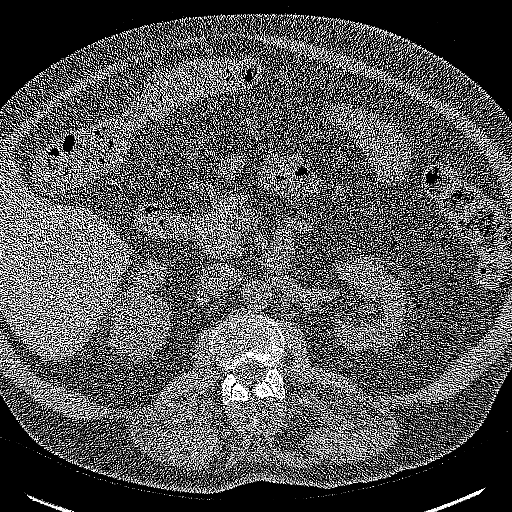
[im 16/328  lung]
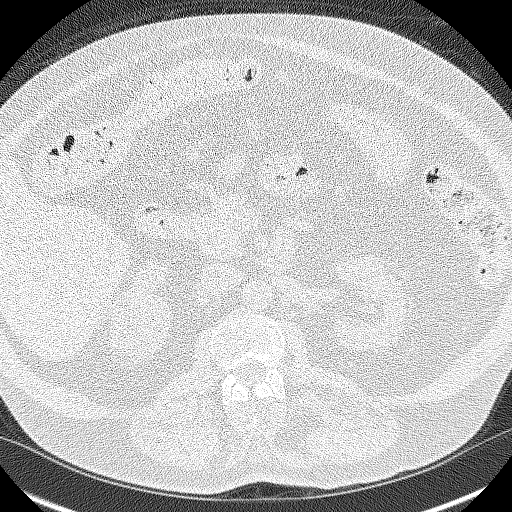
[im 47/328  lung]
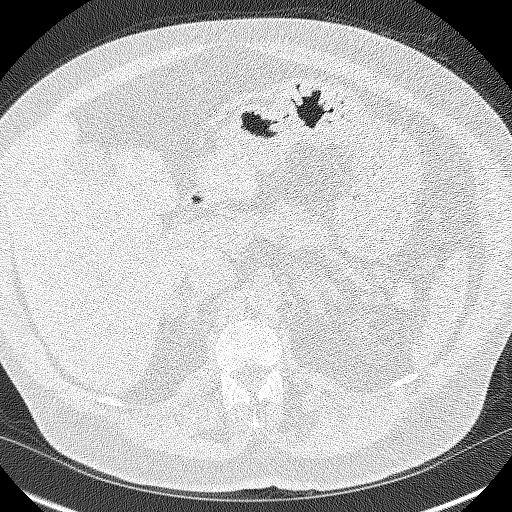
[im 78/328  lung]
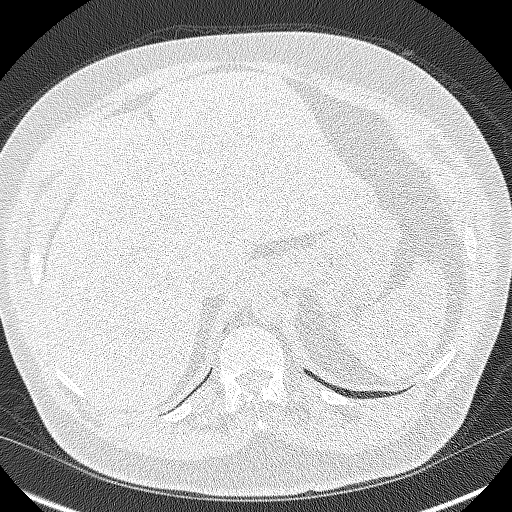
[im 94/328  lung]
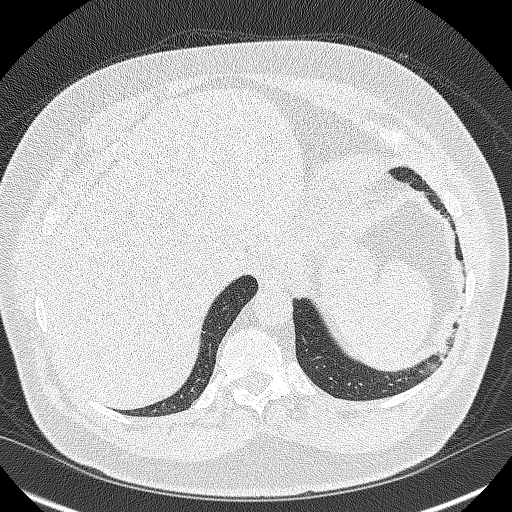
[im 125/328  mediastinal]
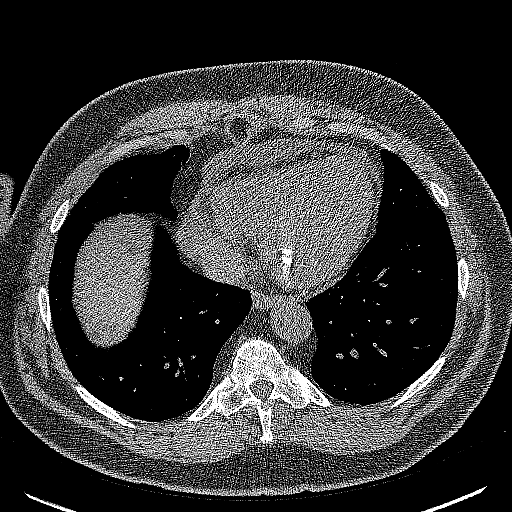
[im 125/328  lung]
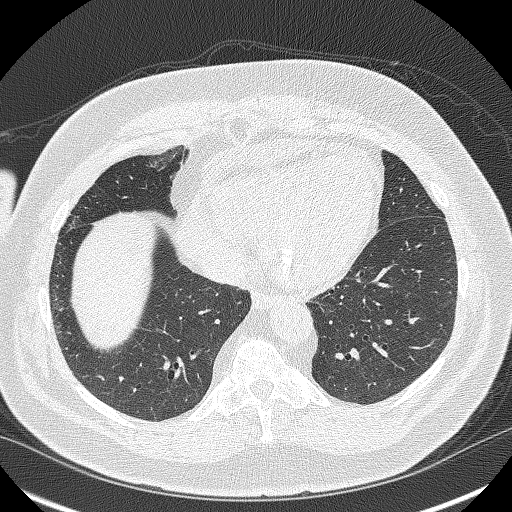
[im 156/328  lung]
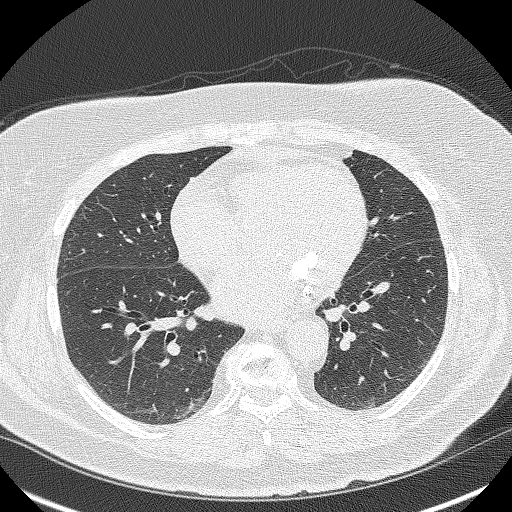
[im 172/328  lung]
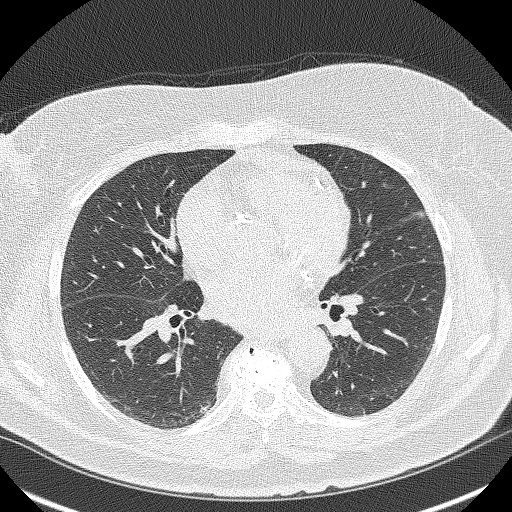
[im 203/328  lung]
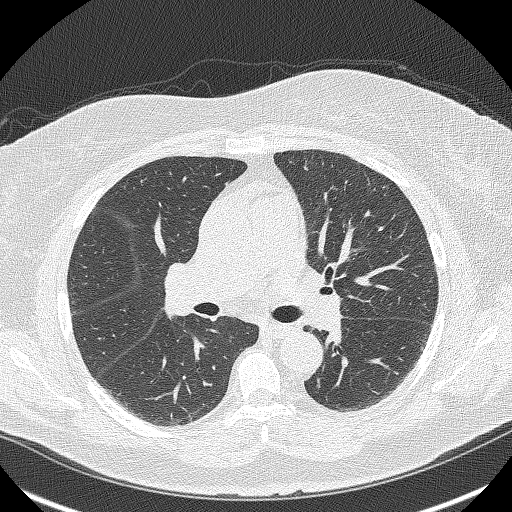
[im 234/328  mediastinal]
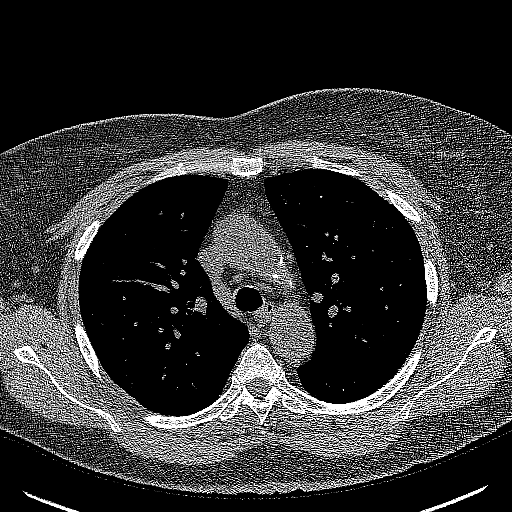
[im 234/328  lung]
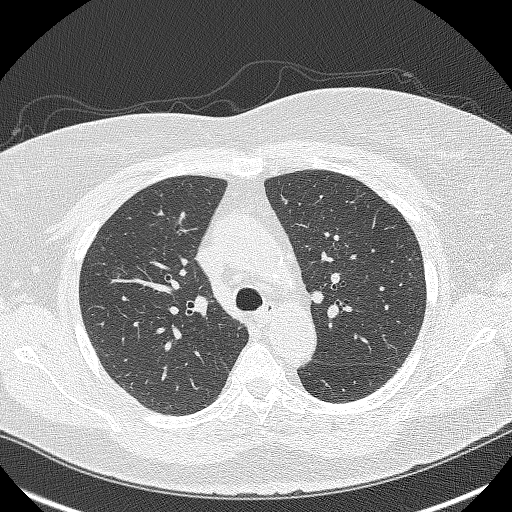
[im 250/328  lung]
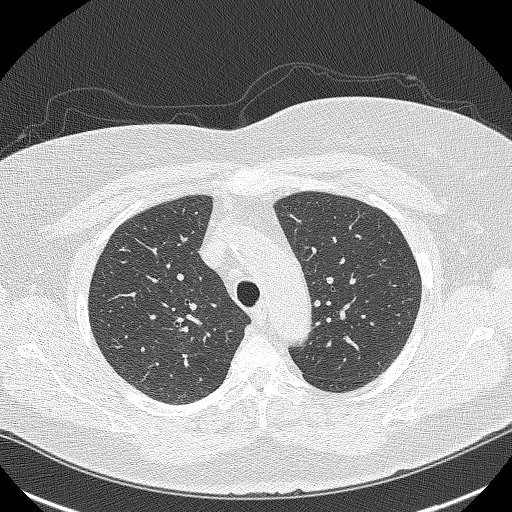
[im 281/328  lung]
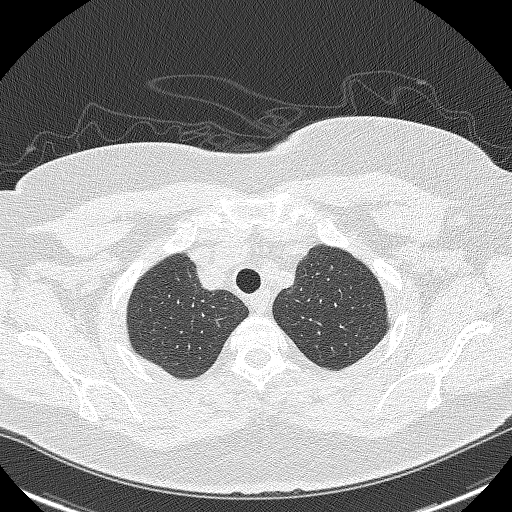
[im 312/328  lung]
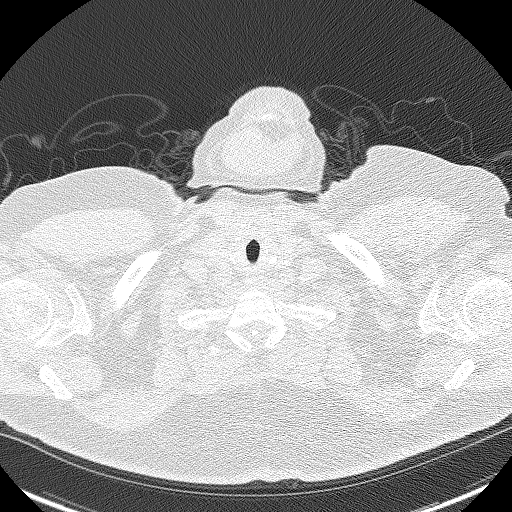

[Series 4: coronals lung 1.00 cor · coronal · 0.64mm/px · 3 of 313 slices shown]
[im 63/313  lung]
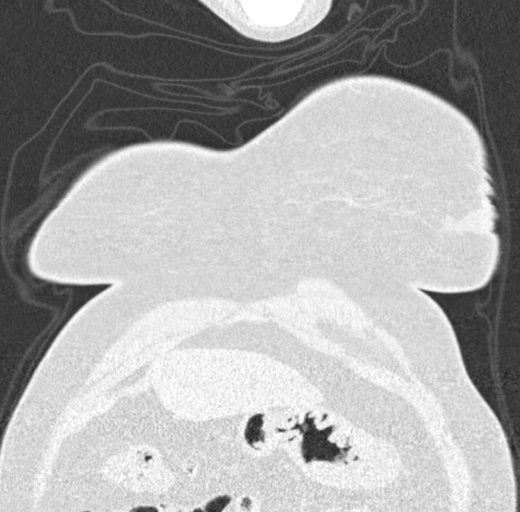
[im 125/313  lung]
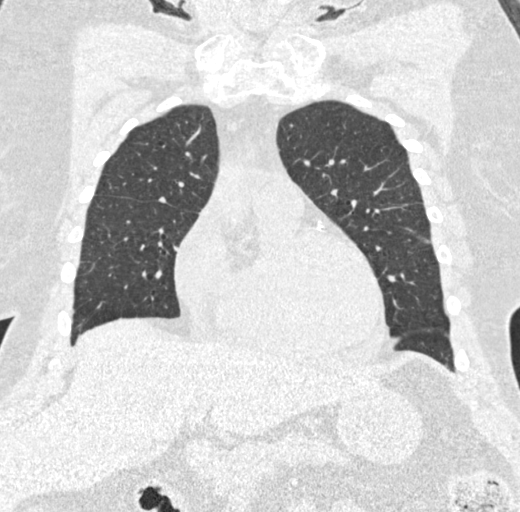
[im 188/313  lung]
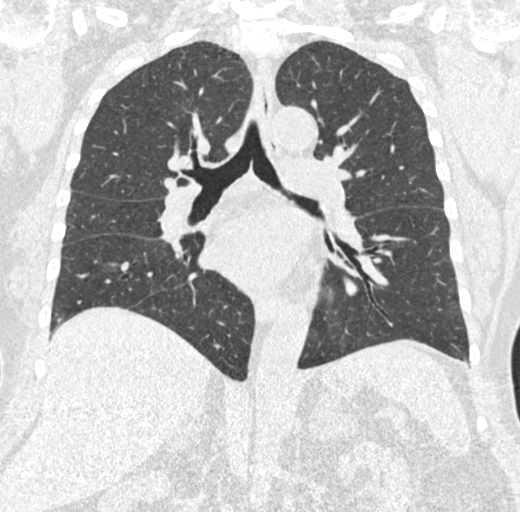

[15 of 40 positions shown; findings below may reference images not displayed]

FINDINGS: Cardiovascular: Atherosclerotic calcification of the aorta and
coronary arteries. Dense mitral annulus calcification. Pulmonic
trunk is enlarged. Heart size normal. Small amount of pericardial
fluid appears similar to 09/27/2018.

Mediastinum/Nodes: No pathologically enlarged mediastinal or
axillary lymph nodes. Hilar regions are difficult to definitively
evaluate without IV contrast but appear grossly unremarkable.
Esophagus is grossly unremarkable.

Lungs/Pleura: Mild centrilobular and paraseptal emphysema. Smoking
related respiratory bronchiolitis. There are multiple new pulmonary
nodules bilaterally, measuring up to 8.5 mm in the right upper lobe
(3/90). Largest nodule has a slightly fuzzy border. Mild basilar
subpleural reticular densities and ground-glass, similar to the
prior exam. No pleural fluid. Airway is unremarkable.

Upper Abdomen: Visualized portions of the liver and gallbladder are
unremarkable. Right adrenal nodules are fluid in density and measure
up to 2.9 cm. Left adrenal nodule is also fluid in density and
measures 3.2 cm. Lesions are stable. Visualized portions of the
kidneys, spleen, pancreas, stomach and bowel are otherwise
unremarkable. No upper abdominal adenopathy.

Musculoskeletal: Degenerative changes in the spine. No worrisome
lytic or sclerotic lesions.
IMPRESSION: 1. New bilateral pulmonary nodules measure up to 8.5 mm in the right
upper lobe. Findings may be infectious/inflammatory or malignant in
etiology. By size, largest lesion is characterized as Lung-RADS 4B,
suspicious. Additional imaging evaluation or consultation with
Pulmonology or Thoracic Surgery recommended. Consider repeat
low-dose chest CT without contrast in 3-4 weeks (please use the
following order, "CT CHEST LCS NODULE FOLLOW-UP W/O CM"), if there
is a trial of antibiotic therapy. These results will be called to
the ordering clinician or representative by the Radiologist
Assistant, and communication documented in the PACS or zVision
Dashboard.
2. Bilateral adrenal adenomas.
3. Aortic atherosclerosis (6BURS-170.0). Coronary artery
calcification.
4. Enlarged pulmonic trunk, indicative of pulmonary arterial
hypertension.
5.  Emphysema (6BURS-AK6.2).

## 2020-07-13 ENCOUNTER — Ambulatory Visit: Payer: Medicare Other | Admitting: Family

## 2020-07-16 ENCOUNTER — Inpatient Hospital Stay: Payer: Medicaid Other | Admitting: Internal Medicine

## 2020-07-16 ENCOUNTER — Inpatient Hospital Stay: Payer: Medicaid Other

## 2020-07-16 ENCOUNTER — Inpatient Hospital Stay: Payer: Medicaid Other | Attending: Internal Medicine

## 2020-07-16 NOTE — Assessment & Plan Note (Deleted)
#   Anemia Iron deficiency- hemoglobin today - 9.5  On PO iron once a day.  Stable; S/p Ferrahem. Proceed with retacrt today.  # Mild hyperkalemia.  Potassium 5.3. on  Veltessa; Recommend close monitoring with nephrology.  # CKD-stage IV creatinine-GFR-12 slow worsening; awaiting fistula placement;   # DISPOSITION: # proceed with Retacrit today # 1 month- cbc/bmp- possible retacrit # Follow-up 2 months/MD-labs CBC BMP- possible retacrit- Dr.B  Cc; Dr.Swain

## 2020-07-16 NOTE — Progress Notes (Deleted)
[facility] OFFICE PROGRESS NOTE  Patient Care Team: Marguerita Merles, MD as PCP - General (Family Medicine) Wellington Hampshire, MD as PCP - Cardiology (Cardiology)   SUMMARY OF ONCOLOGIC HISTORY:  # March 2017- Anemia- Hb 8; plan Iv iron s/p IV iron x2 [[april 2017]; [place] May 2017.   # CKD [? nephrologist as per pt; ? Dr.Swain]/ CAD  # AUG 2016- ELEVATED K/L ratio 1.99; SPEP- Neg.    INTERVAL HISTORY:  69 year-old pleasant female patient with above history of anemia secondary to iron deficiency/CKD.  Patient is awaiting fistula placement tomorrow.  Denies any nausea vomiting abdominal pain pain no blood in stools.  Chronic fatigue chronic shortness of breath on exertion.   Review of Systems  Constitutional: Positive for malaise/fatigue. Negative for chills, diaphoresis, fever and weight loss.  HENT: Negative for nosebleeds and sore throat.   Eyes: Negative for double vision.  Respiratory: Positive for shortness of breath. Negative for cough, hemoptysis, sputum production and wheezing.   Cardiovascular: Negative for chest pain, palpitations, orthopnea and leg swelling.  Gastrointestinal: Negative for abdominal pain, blood in stool, constipation, diarrhea, heartburn, melena, nausea and vomiting.  Genitourinary: Negative for dysuria, frequency and urgency.  Musculoskeletal: Negative for back pain and joint pain.  Skin: Negative.  Negative for itching and rash.  Neurological: Negative for dizziness, tingling, focal weakness, weakness and headaches.  Endo/Heme/Allergies: Does not bruise/bleed easily.  Psychiatric/Behavioral: Negative for depression. The patient is not nervous/anxious and does not have insomnia.      PAST MEDICAL HISTORY :  Past Medical History:  Diagnosis Date  . Anemia   . Anxiety   . Arthritis   . CKD (chronic kidney disease), stage IV ([place])   . Coronary artery disease    a. 03/2014 NSTEMI/Cath: LM nl, LAD 42m, 60d, D1 60, D2 nl,  d3 40, LCX 100 w/ L->L collats, RCA non dom, min irregs, EF 55%-->Med Rx; b. 09/2019 MV: EF 30-44%, large basal inf, basal inflat, mid inf, mid inflat, apical defect - partially reversible.  . DDD (degenerative disc disease)    a. s/p upper back surgery spring 2015.  . Diabetes mellitus without complication ([place])   . Diverticulosis   . Dyspnea   . Dysrhythmia   . Fibromyalgia   . GERD (gastroesophageal reflux disease)   . Hiatal hernia   . Hyperlipidemia   . Hypertension   . Hypertensive heart disease    a. 03/2015 Echo: 55-60%, mild conc LVH; b. 10/2019 Echo: EF 65-70%, mod LVH.  . Mitral stenosis    a. 10/2019 Echo: EF 65-70%, mod LVH. No rwma. Mildly dil LA. MV Ca2+ w/ mod-sev MS (mean grad 73mmHg, Valve area 1.1-1.2). Mild to mod AoV sclerosis w/o stenosis.  . Myocardial infarction ([place])   . Sleep apnea   . Syncope and collapse    a. Early 03/2014->did not seek medical attn.  . Tobacco abuse     PAST SURGICAL HISTORY :   Past Surgical History:  Procedure Laterality Date  . AV FISTULA PLACEMENT Right 05/22/2020   Procedure: ARTERIOVENOUS (AV) FISTULA CREATION (WRIST );  Surgeon: Katha Cabal, MD;  Location: [facility];  Service: Vascular;  Laterality: Right;  . BREAST CYST ASPIRATION Right 2005   NEG  . CARDIAC CATHETERIZATION  06/2005   [facility]  . CARDIAC CATHETERIZATION  04/2014   [facility]  . COLONOSCOPY WITH PROPOFOL N/A 04/27/2016   Procedure: COLONOSCOPY WITH PROPOFOL;  Surgeon: Manya Silvas, MD;  Location:  [facility];  Service: Endoscopy;  Laterality: N/A;  . COLONOSCOPY WITH PROPOFOL N/A 04/06/2020   Procedure: COLONOSCOPY WITH PROPOFOL;  Surgeon: Toledo, Benay Pike, MD;  Location: [facility];  Service: Gastroenterology;  Laterality: N/A;  . CORONARY ANGIOPLASTY    . DILATATION & CURETTAGE/HYSTEROSCOPY WITH MYOSURE N/A 09/18/2015   Procedure: DILATATION & CURETTAGE/HYSTEROSCOPY WITH MYOSURE;  Surgeon: Boykin Nearing, MD;  Location: [facility];  Service:  Gynecology;  Laterality: N/A;  . ESOPHAGOGASTRODUODENOSCOPY (EGD) WITH PROPOFOL N/A 04/27/2016   Procedure: ESOPHAGOGASTRODUODENOSCOPY (EGD) WITH PROPOFOL;  Surgeon: Manya Silvas, MD;  Location: [facility];  Service: Endoscopy;  Laterality: N/A;  . NECK SURGERY     Degenerative Disk Disease and removal of a spinal cyst  . TEE WITHOUT CARDIOVERSION N/A 12/30/2019   Procedure: TRANSESOPHAGEAL ECHOCARDIOGRAM (TEE);  Surgeon: Wellington Hampshire, MD;  Location: [facility];  Service: Cardiovascular;  Laterality: N/A;  . TUBAL LIGATION      FAMILY HISTORY :   Family History  Problem Relation Age of Onset  . Heart disease Mother   . Heart attack Mother   . Hypertension Mother   . Hyperlipidemia Mother   . Breast cancer Sister     SOCIAL HISTORY:   Social History   Tobacco Use  . Smoking status: Current Every Day Smoker    Packs/day: 0.25    Years: 40.00    Pack years: 10.00    Types: Cigarettes  . Smokeless tobacco: Never Used  Vaping Use  . Vaping Use: Never used  Substance Use Topics  . Alcohol use: No  . Drug use: Yes    Types: Marijuana    ALLERGIES:  is allergic to aspirin and hydrochlorothiazide.  MEDICATIONS:  Current Outpatient Medications  Medication Sig Dispense Refill  . allopurinol (ZYLOPRIM) 100 MG tablet Take 100 mg by mouth 2 (two) times daily.    Marland Kitchen amLODipine (NORVASC) 10 MG tablet Take 1 tablet (10 mg total) by mouth daily. (Patient taking differently: Take 10 mg by mouth at bedtime. ) 90 tablet 1  . Calcium Carbonate-Vitamin D (CALCIUM 600+D) 600-400 MG-UNIT tablet Take 1 tablet by mouth daily.    . carvedilol (COREG) 12.5 MG tablet Take 1 tablet (12.5 mg total) by mouth 2 (two) times daily. 180 tablet 0  . cetirizine (ZYRTEC) 10 MG tablet Take 10 mg by mouth at bedtime.     . cholecalciferol (VITAMIN D) 25 MCG (1000 UT) tablet Take 1,000 Units by mouth daily.    . colchicine 0.6 MG tablet Take 0.6 mg by mouth daily as needed (gout flare).     . ferrous  sulfate 325 (65 FE) MG tablet Take 325 mg by mouth every other day.    . fluticasone (FLONASE) 50 MCG/ACT nasal spray Place 1 spray into both nostrils 2 (two) times daily as needed (nasal congestion.).     Marland Kitchen fluticasone (VERAMYST) 27.5 MCG/SPRAY nasal spray Place 2 sprays into the nose daily. 10 g 0  . GLUCOSAMINE HCL PO Take 1 tablet by mouth daily.    Marland Kitchen HUMALOG KWIKPEN 100 UNIT/ML KwikPen Inject 8-28 Units into the skin See admin instructions. Inject 8 units subcutaneously in the morning, 28 units with lunch & 28 units with supper    . HYDROcodone-acetaminophen (NORCO) 5-325 MG tablet Take 1-2 tablets by mouth every 6 (six) hours as needed for moderate pain or severe pain. 40 tablet 0  . LANTUS SOLOSTAR 100 UNIT/ML Solostar Pen Inject 40-44 Units into the skin See admin instructions. Inject 40  units subcutaneously in the morning & inject 44 units subcutaneously at night.    . linaclotide (LINZESS) 72 MCG capsule Take 72 mcg by mouth daily.     . magnesium oxide (MAG-OX) 400 MG tablet Take 400 mg by mouth 3 (three) times daily.     . Olopatadine HCl (PATADAY) 0.2 % SOLN Place 1 drop into both eyes 2 (two) times daily as needed (allergy eyes.).    Marland Kitchen Omega-3 Fatty Acids (FISH OIL MAXIMUM STRENGTH) 1200 MG CAPS Take 1,200 mg by mouth 3 (three) times daily.   0  . pantoprazole (PROTONIX) 20 MG tablet Take 20 mg by mouth 2 (two) times daily.   0  . patiromer (VELTASSA) 8.4 g packet Take 4.2 g by mouth in the morning and at bedtime.     . rosuvastatin (CRESTOR) 40 MG tablet TAKE 1 TABLET BY MOUTH DAILY 30 tablet 0  . sodium bicarbonate 650 MG tablet Take 1,300 mg by mouth 2 (two) times daily.     . SYMBICORT 160-4.5 MCG/ACT inhaler Inhale 2 puffs into the lungs 2 (two) times daily.    Marland Kitchen tiZANidine (ZANAFLEX) 4 MG tablet Take 4 mg by mouth 3 (three) times daily as needed for muscle spasms.    Marland Kitchen triamcinolone cream (KENALOG) 0.1 % Apply 1 application topically 2 (two) times daily as needed (skin  rash/irritation).      No current facility-administered medications for this visit.    PHYSICAL EXAMINATION:   There were no vitals taken for this visit.  There were no vitals filed for this visit.  Physical Exam HENT:     Head: Normocephalic and atraumatic.     Mouth/Throat:     Pharynx: No oropharyngeal exudate.  Eyes:     Pupils: Pupils are equal, round, and reactive to light.  Cardiovascular:     Rate and Rhythm: Normal rate and regular rhythm.  Pulmonary:     Effort: No respiratory distress.     Breath sounds: No wheezing.  Abdominal:     General: Bowel sounds are normal. There is no distension.     Palpations: Abdomen is soft. There is no mass.     Tenderness: There is no abdominal tenderness. There is no guarding or rebound.  Musculoskeletal:        General: No tenderness. Normal range of motion.     Cervical back: Normal range of motion and neck supple.  Skin:    General: Skin is warm.  Neurological:     Mental Status: She is alert and oriented to person, place, and time.  Psychiatric:        Mood and Affect: Affect normal.     LABORATORY DATA:  I have reviewed the data as listed    Component Value Date/Time   NA 143 05/20/2020 1038   NA 139 10/21/2014 1332   K 5.3 (H) 05/20/2020 1038   K 4.4 10/21/2014 1332   CL 111 05/20/2020 1038   CL 108 (H) 10/21/2014 1332   CO2 22 05/20/2020 1038   CO2 24 10/21/2014 1332   GLUCOSE 155 (H) 05/20/2020 1038   GLUCOSE 103 (H) 10/21/2014 1332   BUN 37 (H) 05/20/2020 1038   BUN 21 (H) 10/21/2014 1332   CREATININE 3.84 (H) 05/20/2020 1038   CREATININE 1.45 (H) 10/21/2014 1332   CALCIUM 8.0 (L) 05/20/2020 1038   CALCIUM 9.1 10/21/2014 1332   PROT 7.1 01/24/2020 1305   PROT 7.5 10/21/2014 1332   ALBUMIN 3.8 01/24/2020 1305  ALBUMIN 3.6 10/21/2014 1332   AST 17 01/24/2020 1305   AST 15 10/21/2014 1332   ALT 23 01/24/2020 1305   ALT 27 10/21/2014 1332   ALKPHOS 86 01/24/2020 1305   ALKPHOS 94 10/21/2014 1332    BILITOT 0.3 01/24/2020 1305   BILITOT 0.2 10/21/2014 1332   GFRNONAA 11 (L) 05/20/2020 1038   GFRNONAA 39 (L) 10/21/2014 1332   GFRNONAA 27 (L) 05/10/2014 0031   GFRAA 13 (L) 05/20/2020 1038   GFRAA 47 (L) 10/21/2014 1332   GFRAA 32 (L) 05/10/2014 0031    No results found for: SPEP, UPEP  Lab Results  Component Value Date   WBC 6.2 05/20/2020   NEUTROABS 3.9 05/20/2020   HGB 9.5 (L) 05/20/2020   HCT 29.3 (L) 05/20/2020   MCV 86.9 05/20/2020   PLT 332 05/20/2020      Chemistry      Component Value Date/Time   NA 143 05/20/2020 1038   NA 139 10/21/2014 1332   K 5.3 (H) 05/20/2020 1038   K 4.4 10/21/2014 1332   CL 111 05/20/2020 1038   CL 108 (H) 10/21/2014 1332   CO2 22 05/20/2020 1038   CO2 24 10/21/2014 1332   BUN 37 (H) 05/20/2020 1038   BUN 21 (H) 10/21/2014 1332   CREATININE 3.84 (H) 05/20/2020 1038   CREATININE 1.45 (H) 10/21/2014 1332      Component Value Date/Time   CALCIUM 8.0 (L) 05/20/2020 1038   CALCIUM 9.1 10/21/2014 1332   ALKPHOS 86 01/24/2020 1305   ALKPHOS 94 10/21/2014 1332   AST 17 01/24/2020 1305   AST 15 10/21/2014 1332   ALT 23 01/24/2020 1305   ALT 27 10/21/2014 1332   BILITOT 0.3 01/24/2020 1305   BILITOT 0.2 10/21/2014 1332       ASSESSMENT & PLAN:   No problem-specific Assessment & Plan notes found for this encounter.      Cammie Sickle, MD 07/16/2020 8:16 AM

## 2020-07-20 ENCOUNTER — Ambulatory Visit (INDEPENDENT_AMBULATORY_CARE_PROVIDER_SITE_OTHER): Payer: Medicare Other | Admitting: Vascular Surgery

## 2020-09-10 ENCOUNTER — Ambulatory Visit: Payer: Medicare Other | Admitting: Cardiovascular Disease

## 2021-01-14 IMAGING — CR DG CHEST 2V
1 series · 2 of 2 positions shown · non-contrast
Comparison: None.

CLINICAL DATA: Chest pain radiated to the right-side

EXAM:
CHEST - 2 VIEW

[Series 1: w chest pa · 0.14mm/px · 2 of 2 slices shown]
[im 1/2]
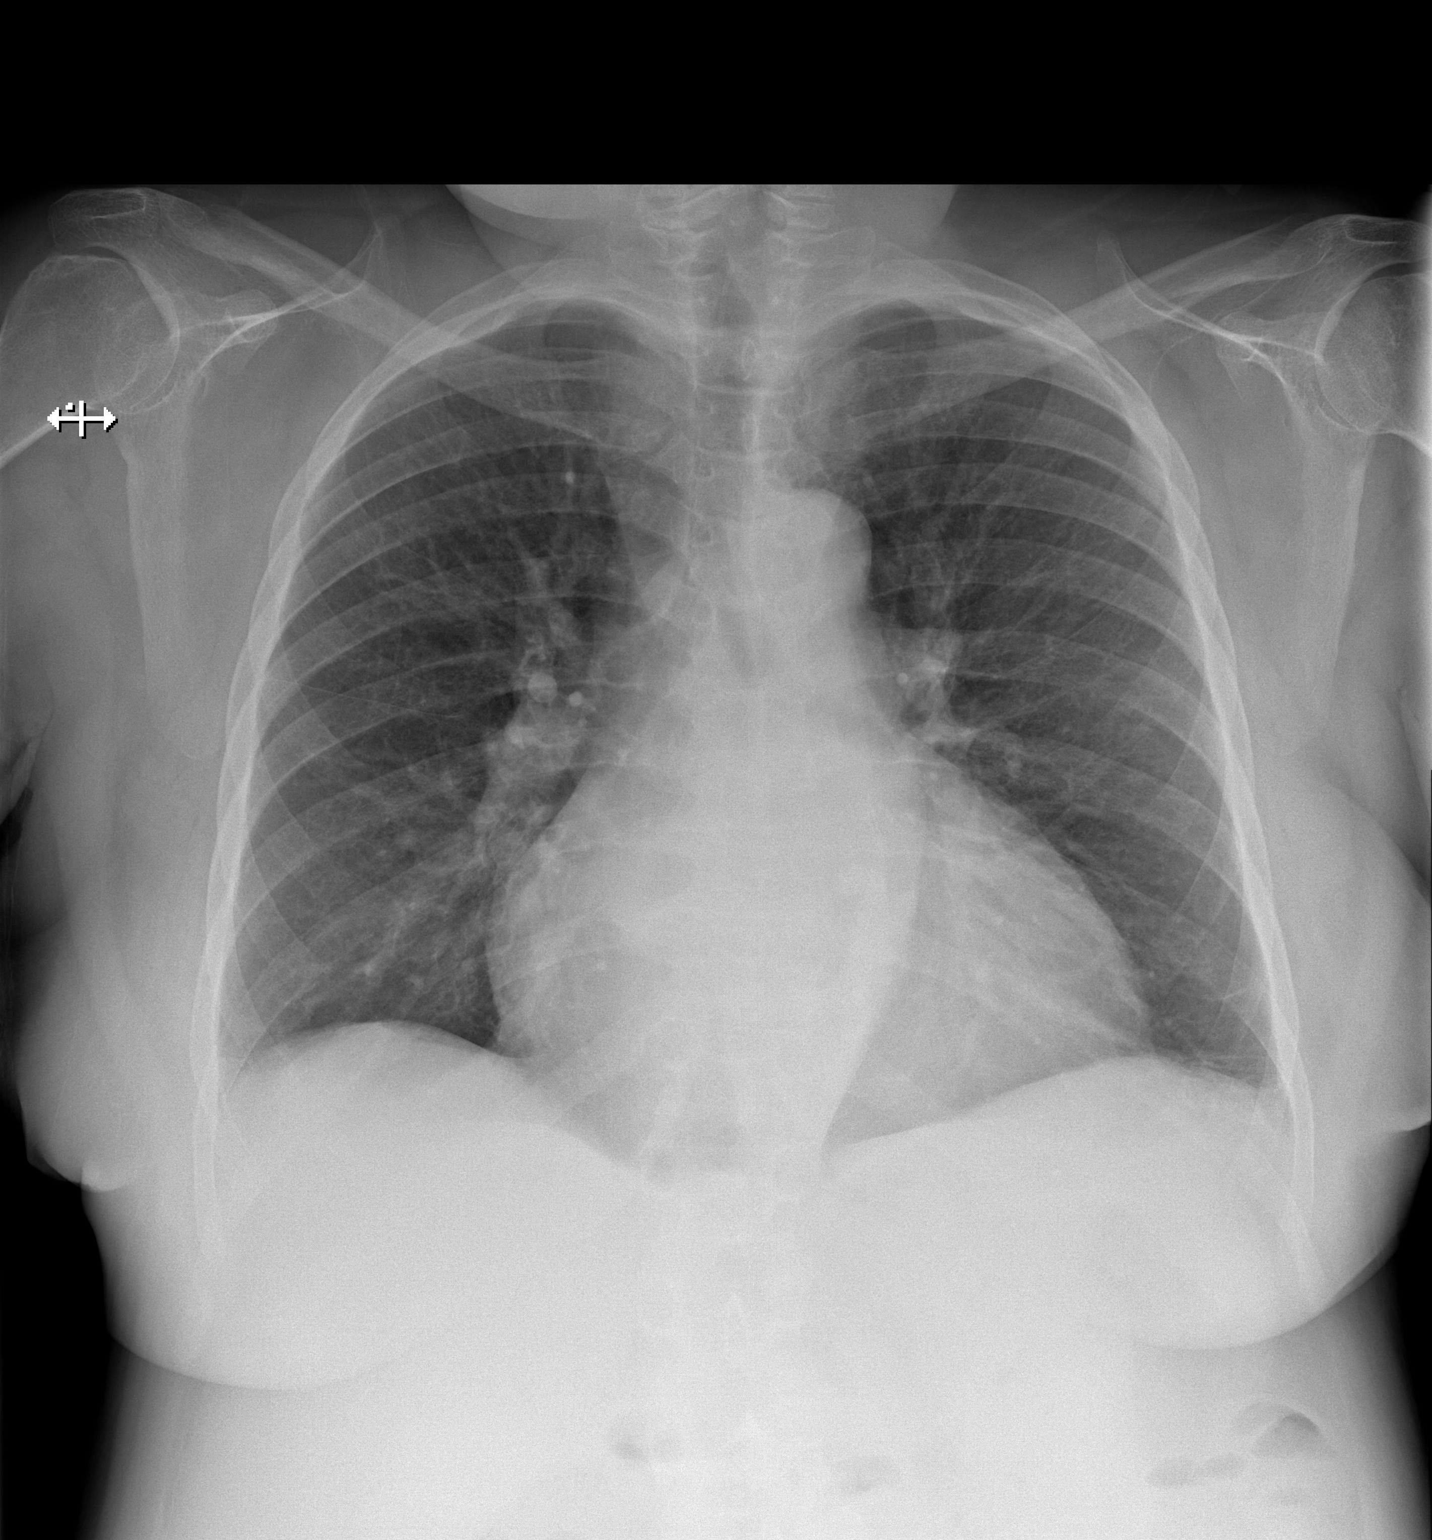
[im 2/2]
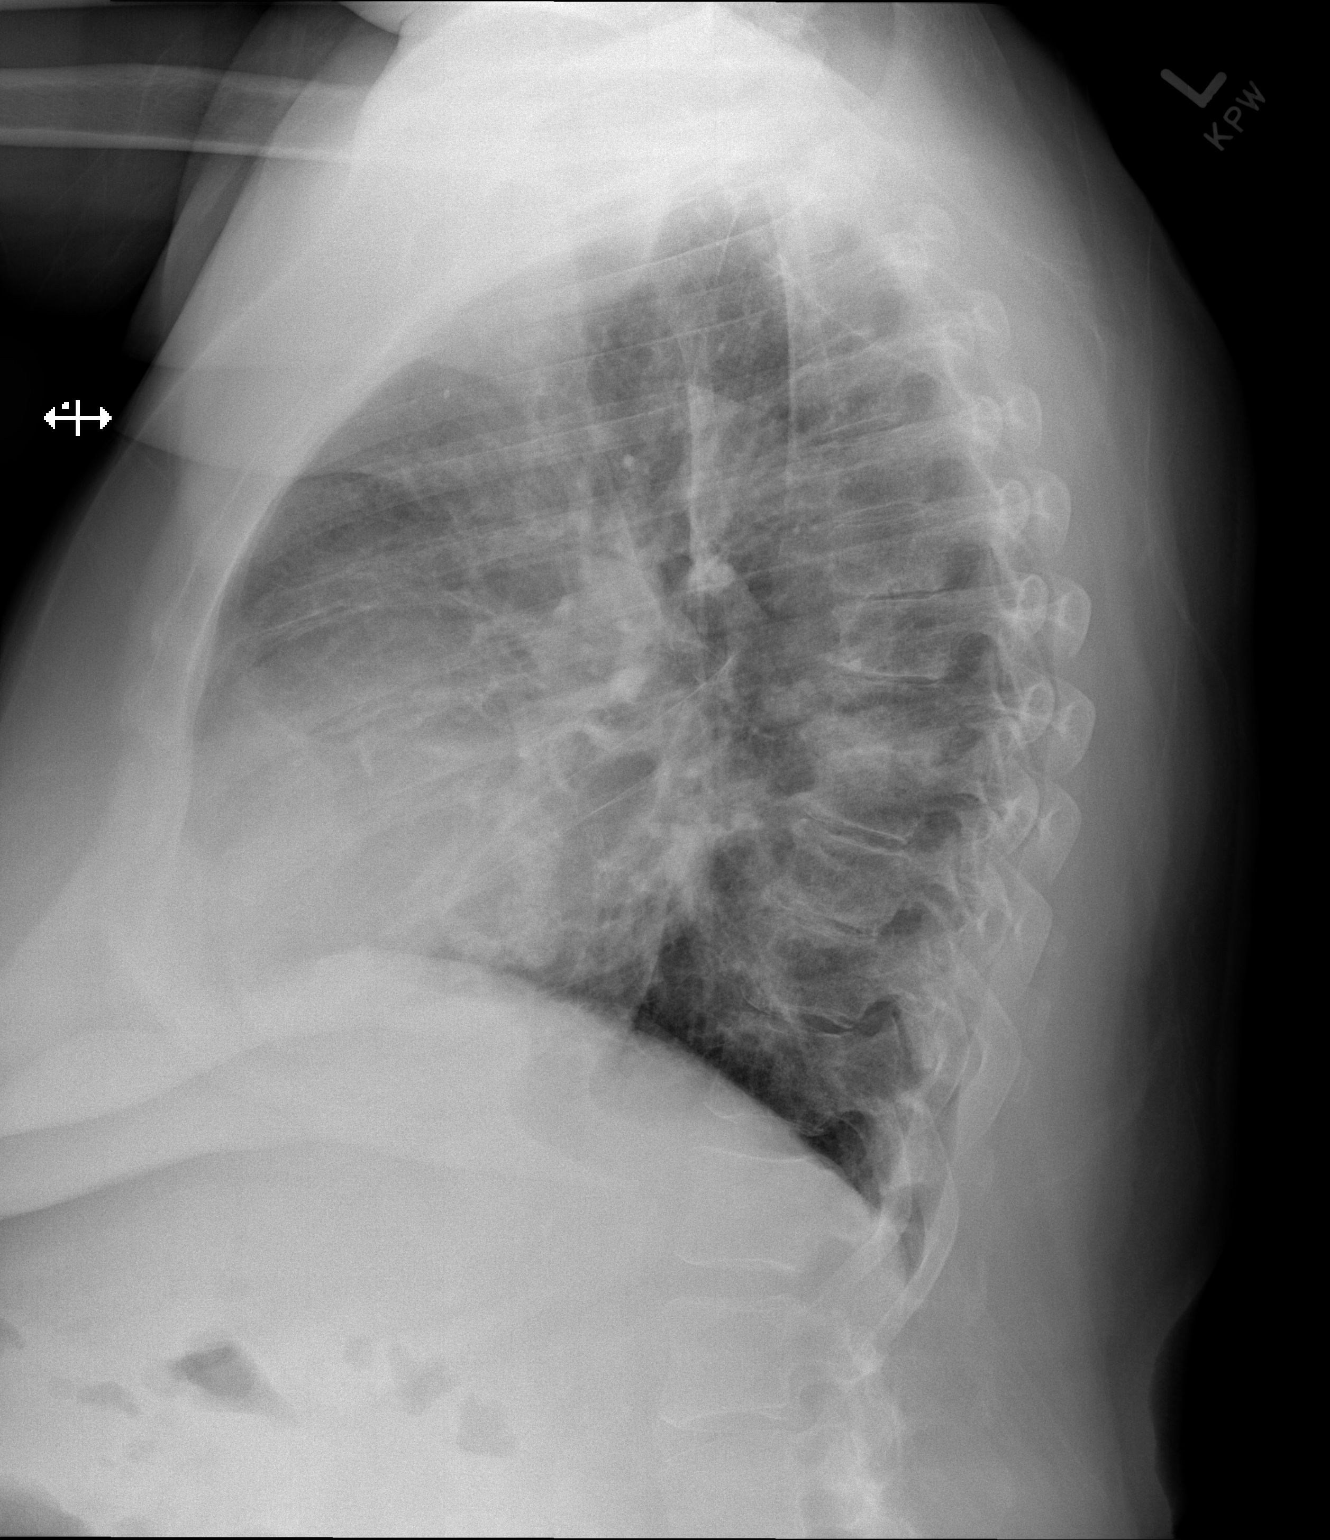

[2 of 2 positions shown; findings below may reference images not displayed]

FINDINGS: The cardiac silhouette is moderately enlarged. Both lungs are clear.
Multilevel degenerative changes seen throughout the mid and lower
thoracic spine.
IMPRESSION: No active cardiopulmonary disease.

## 2021-02-04 IMAGING — CT CT CHEST LCS NODULE FOLLOW-UP W/O CM
2 of 5 series · 15 of 40 positions shown, 18 images · non-contrast
Comparison: 11/01/2019 screening chest CT.

CLINICAL DATA: 68-year-old asymptomatic female current smoker with
31.25 pack-year smoking history, presenting for six-month follow-up.

EXAM:
CT CHEST WITHOUT CONTRAST FOR LUNG CANCER SCREENING NODULE FOLLOW-UP
TECHNIQUE: Multidetector CT imaging of the chest was performed following the
standard protocol without IV contrast.

[Series 3: lung lcs f/u 1.00 · axial · 0.69mm/px · z∈[-1219,-954]mm · 12 of 293 slices shown, 15 images]
[im 14/293  mediastinal]
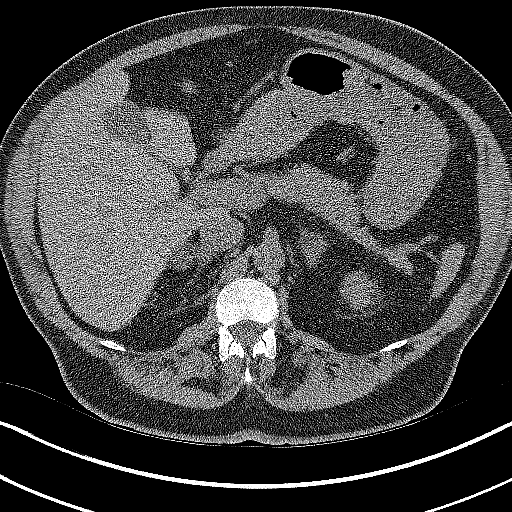
[im 14/293  lung]
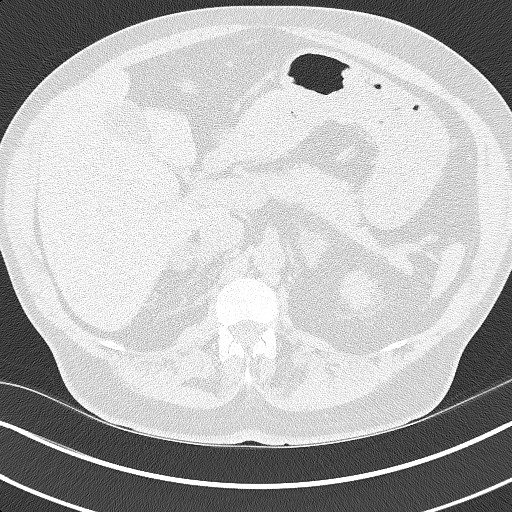
[im 40/293  lung]
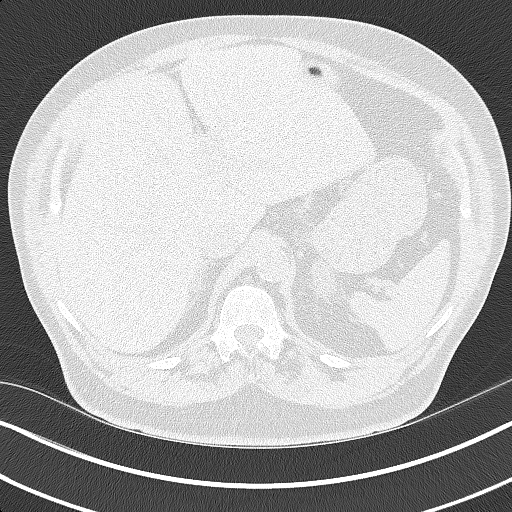
[im 67/293  lung]
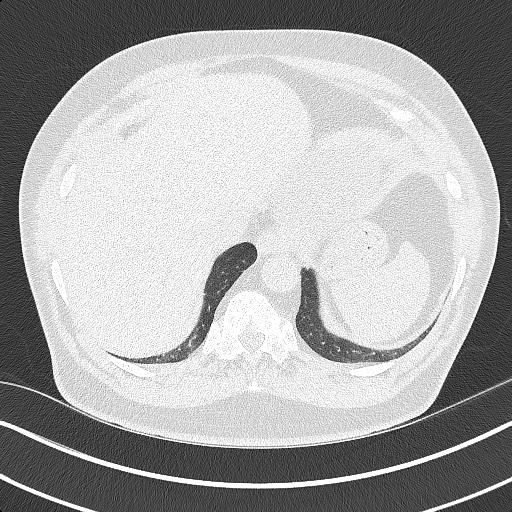
[im 93/293  lung]
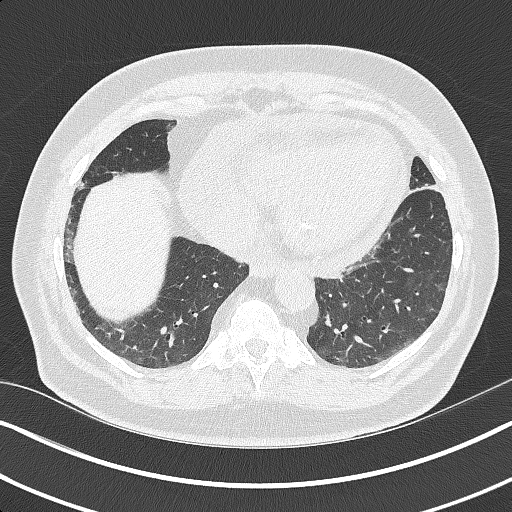
[im 107/293  mediastinal]
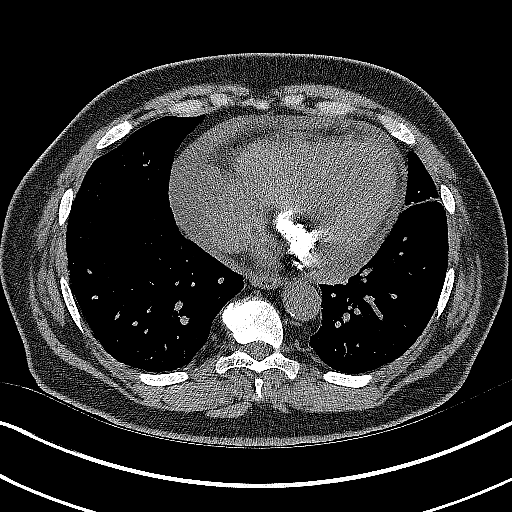
[im 107/293  lung]
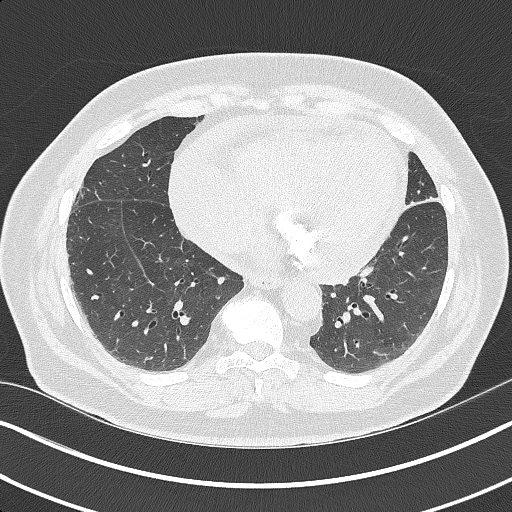
[im 133/293  lung]
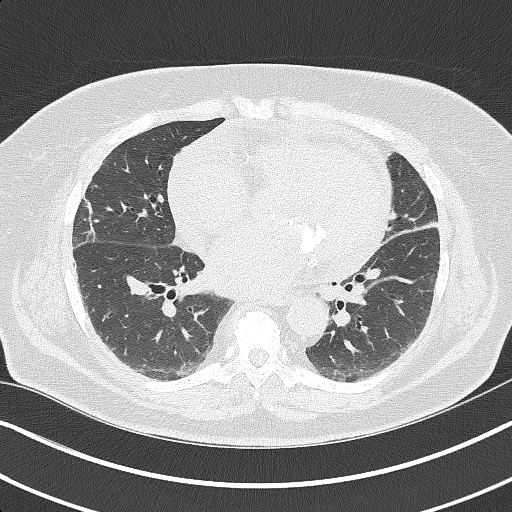
[im 160/293  lung]
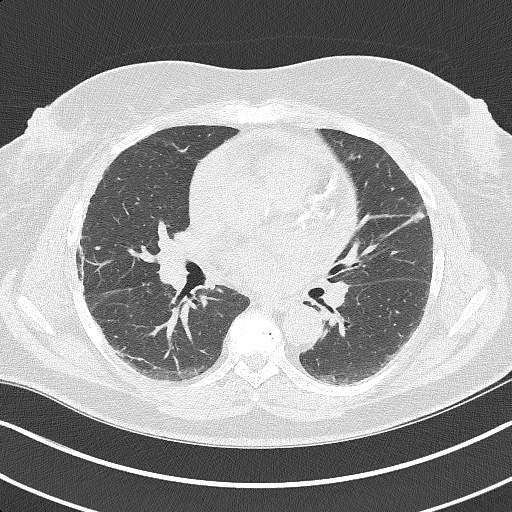
[im 186/293  lung]
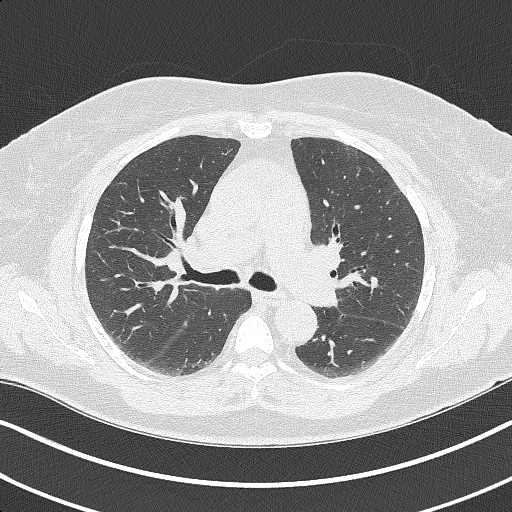
[im 200/293  mediastinal]
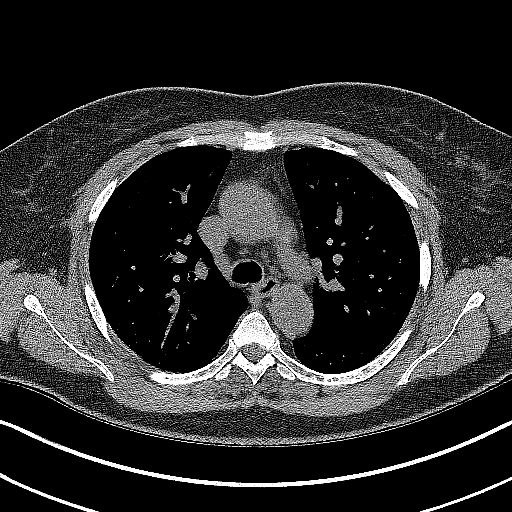
[im 200/293  lung]
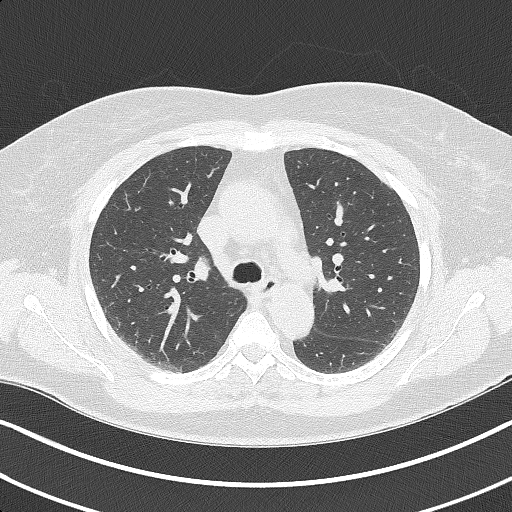
[im 226/293  lung]
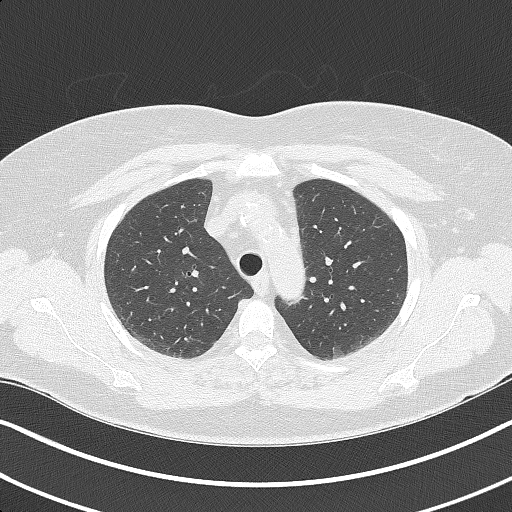
[im 253/293  lung]
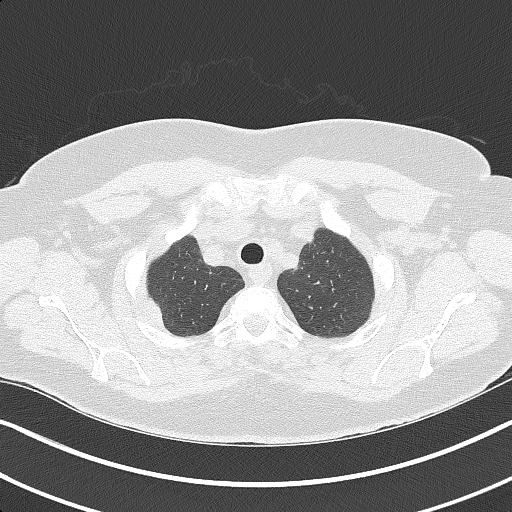
[im 279/293  lung]
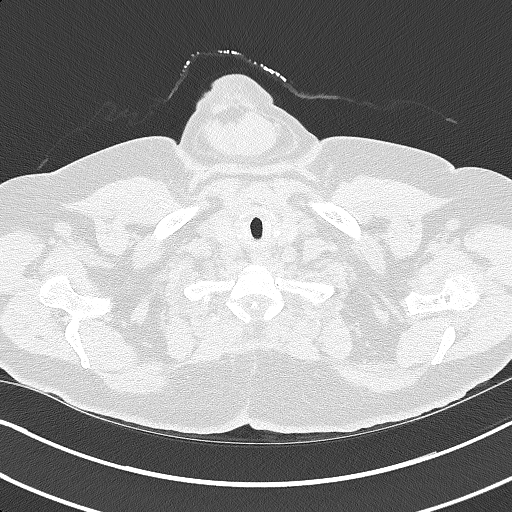

[Series 4: lcs f/u 1.00 cor · coronal · 0.57mm/px · 3 of 309 slices shown]
[im 62/309  lung]
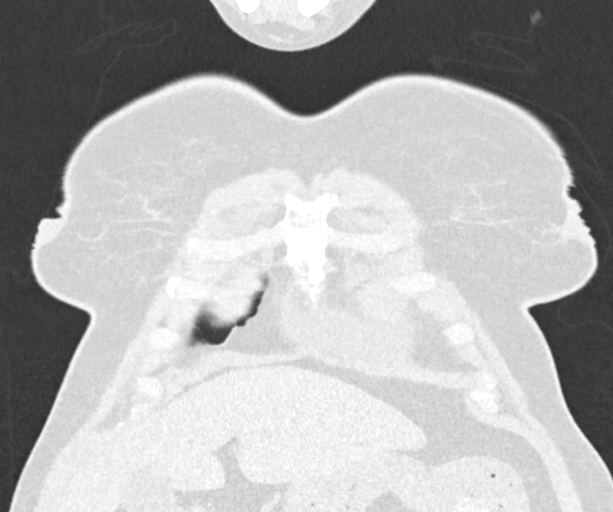
[im 124/309  lung]
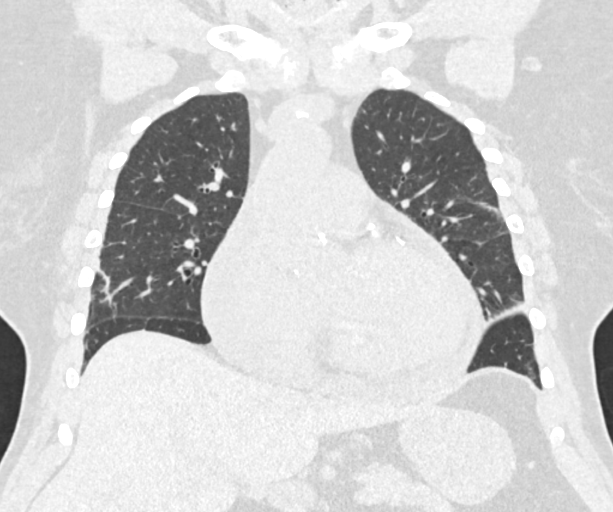
[im 185/309  lung]
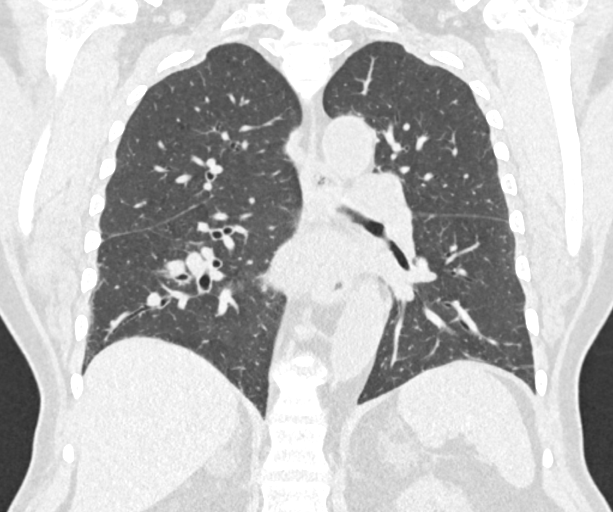

[15 of 40 positions shown; findings below may reference images not displayed]

FINDINGS: Cardiovascular: Top-normal heart size. Moderate pericardial effusion
is increased. Three-vessel coronary atherosclerosis. Atherosclerotic
nonaneurysmal thoracic aorta. Stable top-normal caliber main
pulmonary artery (3.0 cm diameter).

Mediastinum/Nodes: Bilateral hypodense thyroid nodules, largest
cm on the left, stable. Not clinically significant; no follow-up
imaging recommended (ref: [HOSPITAL]. [DATE]): 143-50).
Unremarkable esophagus. No pathologically enlarged axillary,
mediastinal or hilar lymph nodes, noting limited sensitivity for the
detection of hilar adenopathy on this noncontrast study.

Lungs/Pleura: No pneumothorax. No pleural effusion. Mild
centrilobular emphysema. No acute consolidative airspace disease or
lung masses. No significant growth of previously visualized
pulmonary nodules. No new significant pulmonary nodules.

Upper abdomen: Stable bilateral adrenal adenomas measuring 2.7 cm on
the right with density 3 HU and 3.5 cm on the left with density 9
HU.

Musculoskeletal: No aggressive appearing focal osseous lesions.
Marked thoracic spondylosis.
IMPRESSION: 1. Lung-RADS 2, benign appearance or behavior. Continue annual
screening with low-dose chest CT without contrast in 12 months.
2. Moderate pericardial effusion, increased.
3. Stable bilateral adrenal adenomas.
4. Aortic Atherosclerosis (XESVF-QS7.7) and Emphysema (XESVF-KRF.3).

These results will be called to the ordering clinician or
representative by the Radiologist Assistant, and communication
documented in the PACS or [REDACTED].
